# Patient Record
Sex: Male | Born: 1945 | Race: Black or African American | Hispanic: No | Marital: Married | Smoking: Current some day smoker
Health system: Southern US, Community
[De-identification: ages and names within clinical notes are randomized; demographics above are authoritative.]

## PROBLEM LIST (undated history)

## (undated) DIAGNOSIS — M5432 Sciatica, left side: Secondary | ICD-10-CM

## (undated) DIAGNOSIS — I6522 Occlusion and stenosis of left carotid artery: Secondary | ICD-10-CM

## (undated) DIAGNOSIS — R7303 Prediabetes: Secondary | ICD-10-CM

## (undated) DIAGNOSIS — M5126 Other intervertebral disc displacement, lumbar region: Secondary | ICD-10-CM

## (undated) DIAGNOSIS — M48061 Spinal stenosis, lumbar region without neurogenic claudication: Secondary | ICD-10-CM

## (undated) DIAGNOSIS — N529 Male erectile dysfunction, unspecified: Secondary | ICD-10-CM

## (undated) DIAGNOSIS — M5136 Other intervertebral disc degeneration, lumbar region: Secondary | ICD-10-CM

## (undated) DIAGNOSIS — E079 Disorder of thyroid, unspecified: Secondary | ICD-10-CM

## (undated) DIAGNOSIS — M109 Gout, unspecified: Secondary | ICD-10-CM

## (undated) DIAGNOSIS — C61 Malignant neoplasm of prostate: Secondary | ICD-10-CM

## (undated) DIAGNOSIS — Z973 Presence of spectacles and contact lenses: Secondary | ICD-10-CM

## (undated) DIAGNOSIS — Z860101 Personal history of adenomatous and serrated colon polyps: Secondary | ICD-10-CM

## (undated) DIAGNOSIS — I10 Essential (primary) hypertension: Secondary | ICD-10-CM

## (undated) DIAGNOSIS — E785 Hyperlipidemia, unspecified: Secondary | ICD-10-CM

## (undated) DIAGNOSIS — M51369 Other intervertebral disc degeneration, lumbar region without mention of lumbar back pain or lower extremity pain: Secondary | ICD-10-CM

## (undated) DIAGNOSIS — R351 Nocturia: Secondary | ICD-10-CM

## (undated) DIAGNOSIS — Z86010 Personal history of colonic polyps: Secondary | ICD-10-CM

## (undated) DIAGNOSIS — E118 Type 2 diabetes mellitus with unspecified complications: Secondary | ICD-10-CM

## (undated) HISTORY — DX: Disorder of thyroid, unspecified: E07.9

## (undated) HISTORY — DX: Malignant neoplasm of prostate: C61

## (undated) HISTORY — PX: PROSTATE BIOPSY: SHX241

## (undated) HISTORY — DX: Type 2 diabetes mellitus with unspecified complications: E11.8

## (undated) HISTORY — DX: Gout, unspecified: M10.9

## (undated) HISTORY — DX: Occlusion and stenosis of left carotid artery: I65.22

## (undated) HISTORY — PX: NO PAST SURGERIES: SHX2092

---

## 2003-05-22 ENCOUNTER — Ambulatory Visit (HOSPITAL_COMMUNITY): Admission: RE | Admit: 2003-05-22 | Discharge: 2003-05-22 | Payer: Self-pay | Admitting: Gastroenterology

## 2005-08-31 ENCOUNTER — Encounter: Admission: RE | Admit: 2005-08-31 | Discharge: 2005-08-31 | Payer: Self-pay | Admitting: Internal Medicine

## 2007-09-26 LAB — HM COLONOSCOPY

## 2011-10-27 DIAGNOSIS — Z0000 Encounter for general adult medical examination without abnormal findings: Secondary | ICD-10-CM | POA: Diagnosis not present

## 2012-03-31 ENCOUNTER — Emergency Department (INDEPENDENT_AMBULATORY_CARE_PROVIDER_SITE_OTHER)
Admission: EM | Admit: 2012-03-31 | Discharge: 2012-03-31 | Disposition: A | Discharge status: Home / Self Care | Payer: Medicare Other | Source: Home / Self Care | Attending: Emergency Medicine | Admitting: Emergency Medicine

## 2012-03-31 ENCOUNTER — Emergency Department (INDEPENDENT_AMBULATORY_CARE_PROVIDER_SITE_OTHER): Payer: Medicare Other

## 2012-03-31 ENCOUNTER — Encounter (HOSPITAL_COMMUNITY): Payer: Self-pay | Admitting: *Deleted

## 2012-03-31 DIAGNOSIS — S8990XA Unspecified injury of unspecified lower leg, initial encounter: Secondary | ICD-10-CM

## 2012-03-31 DIAGNOSIS — S99929A Unspecified injury of unspecified foot, initial encounter: Secondary | ICD-10-CM | POA: Diagnosis not present

## 2012-03-31 DIAGNOSIS — S99921A Unspecified injury of right foot, initial encounter: Secondary | ICD-10-CM

## 2012-03-31 DIAGNOSIS — S99919A Unspecified injury of unspecified ankle, initial encounter: Secondary | ICD-10-CM

## 2012-03-31 HISTORY — DX: Essential (primary) hypertension: I10

## 2012-03-31 LAB — URIC ACID: Uric Acid, Serum: 9.4 mg/dL — ABNORMAL HIGH (ref 4.0–7.8)

## 2012-03-31 MED ORDER — DICLOFENAC SODIUM 1 % TD GEL: 1.0000 "application " | Freq: Four times a day (QID) | TRANSDERMAL | Status: DC

## 2012-03-31 MED ORDER — HYDROCODONE-ACETAMINOPHEN 5-325 MG PO TABS: 2.0000 | ORAL_TABLET | ORAL | Status: AC | PRN

## 2012-03-31 NOTE — ED Notes (Signed)
Pt with c/o left foot pain onset yesterday after doing jumping jacks - pt left great toe joint red and swollen - c/o pain joint and across anterior foot

## 2012-03-31 NOTE — ED Provider Notes (Signed)
History     CSN: [id number]  Arrival date & time 03/31/12  4098   First MD Initiated Contact with Patient 03/31/12 1910      Chief Complaint  Patient presents with  . Foot Pain  . Foot Injury    (Consider location/radiation/quality/duration/timing/severity/associated sxs/prior treatment) HPI Comments: Patient states that was doing jumping jacks in his bare feet yesterday, and noticed pain, erythema, swelling at his right first MTP this morning. Reports pain with walking, limitation of motion secondary to pain. No nausea, vomiting, fevers. No hyperesthesias preceding the swelling. He has no history of gout, coagulopathies. He is not on any blood thinners or antiplatelet agents. He is not a diabetic.  ROS as noted in HPI. All other ROS negative.   Patient is a 66 y.o. male presenting with foot injury. The history is provided by the patient. No language interpreter was used.  Foot Injury  The incident occurred yesterday. The incident occurred at home. The injury mechanism was a direct blow. The pain is present in the right foot. The pain is moderate. The pain has been constant since onset. Associated symptoms include inability to bear weight and loss of motion. Pertinent negatives include no numbness, no muscle weakness, no loss of sensation and no tingling. He reports no foreign bodies present. The symptoms are aggravated by activity, bearing weight and palpation. He has tried nothing for the symptoms. The treatment provided no relief.    Past Medical History  Diagnosis Date  . Hypertension   . High cholesterol     History reviewed. No pertinent past surgical history.  History reviewed. No pertinent family history.  History  Substance Use Topics  . Smoking status: Current Some Day Smoker    Types: Cigars  . Smokeless tobacco: Not on file  . Alcohol Use: Yes      Review of Systems  Neurological: Negative for tingling and numbness.    Allergies  Review of patient's  allergies indicates no known allergies.  Home Medications   Current Outpatient Rx  Name Route Sig Dispense Refill  . ATORVASTATIN CALCIUM 10 MG PO TABS Oral Take by mouth daily.    Marland Kitchen HYDROCHLOROTHIAZIDE PO Oral Take by mouth.    Marland Kitchen HYZAAR PO Oral Take by mouth.    . DICLOFENAC SODIUM 1 % TD GEL Topical Apply 1 application topically 4 (four) times daily. 100 g 0  . HYDROCODONE-ACETAMINOPHEN 5-325 MG PO TABS Oral Take 2 tablets by mouth every 4 (four) hours as needed for pain. 20 tablet 0    BP 152/85  Pulse 70  Temp 99 F (37.2 C) (Oral)  Resp 18  SpO2 95%  Physical Exam  Nursing note and vitals reviewed. Constitutional: He is oriented to person, place, and time. He appears well-developed and well-nourished.  HENT:  Head: Normocephalic and atraumatic.  Eyes: Conjunctivae and EOM are normal.  Neck: Normal range of motion.  Cardiovascular: Normal rate.   Pulmonary/Chest: Effort normal. No respiratory distress.  Abdominal: He exhibits no distension.  Musculoskeletal: Normal range of motion.       Erythema, tenderness, swelling, increased temperature at right first MTP. Skin intact. Pain with passive flexion extension of toe., And pain on the medial aspect of the joint. Right midfoot NT. Base of fifth metatarsal NT. No bruising.  DP 2+. Sensation grossly intact. Patient able to move all toes actively.   no pain with inversion / eversion,  dorsiflexion / plantarflexion. Ankle exam within normal limits. Patient able to bear weight while  in department.   Neurological: He is alert and oriented to person, place, and time. Coordination normal.  Skin: Skin is warm and dry.  Psychiatric: He has a normal mood and affect. His behavior is normal. Judgment and thought content normal.    ED Course  Procedures (including critical care time)   Labs Reviewed  URIC ACID   Dg Foot Complete Right  03/31/2012  *RADIOLOGY REPORT*  Clinical Data: Injury  RIGHT FOOT COMPLETE - 3+ VIEW  Comparison:  None.  Findings: Mild degenerative change of the first metatarsal- phalangeal joint.  Minimal spurring at the inferior calcaneus.  No acute fracture.  No dislocation.  IMPRESSION: Mild degenerative change.  No acute bony pathology.  Original Report Authenticated By: Donavan Burnet, M.D.     1. Right foot injury       MDM  X-ray reviewed by myself. Discussed with radiologist. No fractures.   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 but we'll check a uric acid, as he is male and on diuretics which would increase his susceptibility to gout. Sending him with topical Voltaren, as he is elderly and on ACEi, 2 diuretics. Advised ice, elevation, Norco as needed. Home with stiff soled shoe and Ace wrap. Will contact him if his uric acid is elevated. He'll followup with his para care physician as needed.  Luiz Blare, MD 03/31/12 2028

## 2012-04-01 ENCOUNTER — Telehealth (HOSPITAL_COMMUNITY): Payer: Self-pay | Admitting: *Deleted

## 2012-04-01 NOTE — ED Notes (Signed)
Uric Acid 9.4 H.  Lab shown to Dr. Chaney Malling. Order obtained for Colchicine 1.2 gm. PO x 1 and 0.6 mg. PO 1 hr later (0.6 mg # 3).  Tell pt. it is suggestive of gout but would need a tap for a true diagnosis.  Tell pt. to f/u with his PCP for further diagnosis and treatment.  I called pt. and left message for pt. to call. Travis Singh 04/01/2012

## 2012-04-02 ENCOUNTER — Telehealth (HOSPITAL_COMMUNITY): Payer: Self-pay | Admitting: *Deleted

## 2012-04-03 ENCOUNTER — Telehealth (HOSPITAL_COMMUNITY): Payer: Self-pay | Admitting: *Deleted

## 2012-04-04 ENCOUNTER — Telehealth (HOSPITAL_COMMUNITY): Payer: Self-pay | Admitting: *Deleted

## 2012-04-06 ENCOUNTER — Telehealth (HOSPITAL_COMMUNITY): Payer: Self-pay | Admitting: Emergency Medicine

## 2012-04-06 NOTE — ED Notes (Signed)
Pt came in today bringing letter sent by Cherly Anderson RN.  Patient states his pharmacy is [facility].  RX called in to [facility] [address] by Sheffield Slider RN

## 2012-04-09 DIAGNOSIS — M109 Gout, unspecified: Secondary | ICD-10-CM | POA: Diagnosis not present

## 2012-06-10 DIAGNOSIS — E785 Hyperlipidemia, unspecified: Secondary | ICD-10-CM | POA: Diagnosis not present

## 2012-06-10 DIAGNOSIS — M109 Gout, unspecified: Secondary | ICD-10-CM | POA: Diagnosis not present

## 2012-06-10 DIAGNOSIS — N400 Enlarged prostate without lower urinary tract symptoms: Secondary | ICD-10-CM | POA: Diagnosis not present

## 2012-11-14 ENCOUNTER — Encounter: Payer: Self-pay | Admitting: *Deleted

## 2012-11-14 DIAGNOSIS — I10 Essential (primary) hypertension: Secondary | ICD-10-CM | POA: Insufficient documentation

## 2012-11-22 DIAGNOSIS — I10 Essential (primary) hypertension: Secondary | ICD-10-CM | POA: Diagnosis not present

## 2012-11-22 DIAGNOSIS — E785 Hyperlipidemia, unspecified: Secondary | ICD-10-CM | POA: Diagnosis not present

## 2012-11-25 DIAGNOSIS — H25099 Other age-related incipient cataract, unspecified eye: Secondary | ICD-10-CM | POA: Diagnosis not present

## 2012-11-28 DIAGNOSIS — I10 Essential (primary) hypertension: Secondary | ICD-10-CM | POA: Diagnosis not present

## 2012-11-28 DIAGNOSIS — E785 Hyperlipidemia, unspecified: Secondary | ICD-10-CM | POA: Diagnosis not present

## 2013-01-24 ENCOUNTER — Telehealth: Payer: Self-pay | Admitting: Family Medicine

## 2013-01-24 MED ORDER — COLCHICINE 0.6 MG PO TABS: 0.6000 mg | ORAL_TABLET | Freq: Every day | ORAL | Status: DC

## 2013-01-24 NOTE — Telephone Encounter (Signed)
Rx Refilled  

## 2013-02-19 ENCOUNTER — Other Ambulatory Visit: Payer: Self-pay | Admitting: Family Medicine

## 2013-02-19 MED ORDER — LISINOPRIL 20 MG PO TABS: 20.0000 mg | ORAL_TABLET | Freq: Every day | ORAL | Status: DC

## 2013-02-19 NOTE — Telephone Encounter (Signed)
Rx Refilled  

## 2013-09-23 ENCOUNTER — Encounter: Payer: Self-pay | Admitting: Family Medicine

## 2013-09-23 ENCOUNTER — Ambulatory Visit (INDEPENDENT_AMBULATORY_CARE_PROVIDER_SITE_OTHER): Payer: Medicare Other | Admitting: Family Medicine

## 2013-09-23 VITALS — BP 140/80 | HR 78 | Temp 98.8°F | Resp 16 | Ht 68.5 in | Wt 208.0 lb

## 2013-09-23 DIAGNOSIS — E785 Hyperlipidemia, unspecified: Secondary | ICD-10-CM | POA: Diagnosis not present

## 2013-09-23 DIAGNOSIS — I10 Essential (primary) hypertension: Secondary | ICD-10-CM

## 2013-09-23 DIAGNOSIS — M5430 Sciatica, unspecified side: Secondary | ICD-10-CM | POA: Insufficient documentation

## 2013-09-23 DIAGNOSIS — M5431 Sciatica, right side: Secondary | ICD-10-CM

## 2013-09-23 LAB — LIPID PANEL
Cholesterol: 179 mg/dL (ref 0–200)
HDL: 43 mg/dL (ref >39)
LDL Cholesterol: 105 mg/dL — ABNORMAL HIGH (ref 0–99)
Total CHOL/HDL Ratio: 4.2 Ratio
Triglycerides: 153 mg/dL — ABNORMAL HIGH (ref <150)
VLDL: 31 mg/dL (ref 0–40)

## 2013-09-23 LAB — CBC WITH DIFFERENTIAL/PLATELET
Basophils Absolute: 0 10*3/uL (ref 0.0–0.1)
Basophils Relative: 0 % (ref 0–1)
Eosinophils Absolute: 0.1 10*3/uL (ref 0.0–0.7)
Eosinophils Relative: 2 % (ref 0–5)
HCT: 44.1 % (ref 39.0–52.0)
Hemoglobin: 14.9 g/dL (ref 13.0–17.0)
Lymphocytes Relative: 29 % (ref 12–46)
Lymphs Abs: 1.4 10*3/uL (ref 0.7–4.0)
MCH: 28.2 pg (ref 26.0–34.0)
MCHC: 33.8 g/dL (ref 30.0–36.0)
MCV: 83.5 fL (ref 78.0–100.0)
Monocytes Absolute: 0.6 10*3/uL (ref 0.1–1.0)
Monocytes Relative: 12 % (ref 3–12)
Neutro Abs: 2.8 10*3/uL (ref 1.7–7.7)
Neutrophils Relative %: 57 % (ref 43–77)
Platelets: 187 10*3/uL (ref 150–400)
RBC: 5.28 MIL/uL (ref 4.22–5.81)
RDW: 14.3 % (ref 11.5–15.5)
WBC: 4.8 10*3/uL (ref 4.0–10.5)

## 2013-09-23 LAB — COMPREHENSIVE METABOLIC PANEL
ALT: 26 U/L (ref 0–53)
AST: 20 U/L (ref 0–37)
Albumin: 4 g/dL (ref 3.5–5.2)
Alkaline Phosphatase: 73 U/L (ref 39–117)
BUN: 13 mg/dL (ref 6–23)
CO2: 21 mEq/L (ref 19–32)
Calcium: 9.1 mg/dL (ref 8.4–10.5)
Chloride: 108 mEq/L (ref 96–112)
Creat: 0.96 mg/dL (ref 0.50–1.35)
Glucose, Bld: 108 mg/dL — ABNORMAL HIGH (ref 70–99)
Potassium: 4.5 mEq/L (ref 3.5–5.3)
Sodium: 140 mEq/L (ref 135–145)
Total Bilirubin: 0.8 mg/dL (ref 0.3–1.2)
Total Protein: 6.8 g/dL (ref 6.0–8.3)

## 2013-09-23 MED ORDER — COLCHICINE 0.6 MG PO TABS: 0.6000 mg | ORAL_TABLET | Freq: Every day | ORAL | Status: DC

## 2013-09-23 MED ORDER — METHYLPREDNISOLONE 4 MG PO KIT: PACK | ORAL | Status: DC

## 2013-09-23 NOTE — Assessment & Plan Note (Signed)
His blood pressure looks okay based on his age. He is currently on lisinopril and verapamil

## 2013-09-23 NOTE — Assessment & Plan Note (Signed)
He is on Lipitor. I will check his lipid panel as well as his liver function tests

## 2013-09-23 NOTE — Progress Notes (Signed)
   Subjective:    Patient ID: Travis Singh, male    DOB: 09-08-1946, 67 y.o.   MRN: [id number]  HPI  Patient here with right-sided back and leg pain for the past week. He states that when he sits or lays he does not have any pain but when he gets up to walk he gets a shooting sensation on the right side of his back down his leg. He admits to some tingling after he has a sharp pain. He denies any change in bowel or bladder. He denies any weakness in the legs he denies any falls. He is very active and continues to work. He does take Tylenol every now and then.  He also has a history of hypertension his medications were reviewed he also has a history of gout. He is overdue for fasting labs.  Review of Systems  GEN- denies fatigue, fever, weight loss,weakness, recent illness HEENT- denies eye drainage, change in vision, nasal discharge, CVS- denies chest pain, palpitations RESP- denies SOB, cough, wheeze ABD- denies N/V, change in stools, abd pain GU- denies dysuria, hematuria, dribbling, incontinence MSK- + joint pain, muscle aches, injury Neuro- denies headache, dizziness, syncope, seizure activity      Objective:   Physical Exam GEN- NAD, alert and oriented x3 HEENT- PERRL, EOMI, non injected sclera, pink conjunctiva, MMM, oropharynx clear Neck- Supple, no thryomegaly CVS- RRR, soft systolic murmur RESP-CTAB ABD-NABS,soft,NT,ND, no CVA tenderness MSK- Spine NT, neg SLR bilat, Good ROM Bilat Hips Neuro- normal tone bilat, strength equal bilat, DTR 1+ bilat, sensation in tact EXT- No edema Pulses- Radial 2+        Assessment & Plan:   Note he did pass when his colonoscopy was due as he received a letter in the mail from his insurance company, I do not have a copy of the colonoscopy that I can see in his chart.

## 2013-09-23 NOTE — Assessment & Plan Note (Signed)
I wouldn't treat based on duration exam for sciatica. At that time on a Medrol Dosepak. I advised not to take anti-inflammatories with the prednisone at this time. He can take acetaminophen as needed. If he does not improve I will obtain a plain film of the back and proceed from there.

## 2013-09-23 NOTE — Patient Instructions (Addendum)
We will send letter with results if normal Start prednisone pak for your back Okay to take Tylenol Do not take any Ibuprofen or Aleve with the prednisone Call if the back does not improve COntinue all other medications Release of records- Dr. Charna Elizabeth- Last Colonoscopy F/U 3 months   Sciatica Sciatica is pain, weakness, numbness, or tingling along your sciatic nerve. The nerve starts in the lower back and runs down the back of each leg. Nerve damage or certain conditions pinch or put pressure on the sciatic nerve. This causes the pain, weakness, and other discomforts of sciatica. HOME CARE   Only take medicine as told by your doctor.  Apply ice to the affected area for 20 minutes. Do this 3 4 times a day for the first 48 72 hours. Then try heat in the same way.  Exercise, stretch, or do your usual activities if these do not make your pain worse.  Go to physical therapy as told by your doctor.  Keep all doctor visits as told.  Do not wear high heels or shoes that are not supportive.  Get a firm mattress if your mattress is too soft to lessen pain and discomfort. GET HELP RIGHT AWAY IF:   You cannot control when you poop (bowel movement) or pee (urinate).  You have more weakness in your lower back, lower belly (pelvis), butt (buttocks), or legs.  You have redness or puffiness (swelling) of your back.  You have a burning feeling when you pee.  You have pain that gets worse when you lie down.  You have pain that wakes you from your sleep.  Your pain is worse than past pain.  Your pain lasts longer than 4 weeks.  You are suddenly losing weight without reason. MAKE SURE YOU:   Understand these instructions.  Will watch this condition.  Will get help right away if you are not doing well or get worse. Document Released: 06/20/2008 Document Revised: 03/12/2012 Document Reviewed: 01/21/2012 [facility] Patient Information 2014 [place], [place].

## 2013-09-24 ENCOUNTER — Encounter: Payer: Self-pay | Admitting: *Deleted

## 2013-10-28 ENCOUNTER — Encounter: Payer: Self-pay | Admitting: Family Medicine

## 2013-10-28 ENCOUNTER — Ambulatory Visit (INDEPENDENT_AMBULATORY_CARE_PROVIDER_SITE_OTHER): Payer: Medicare Other | Admitting: Family Medicine

## 2013-10-28 VITALS — BP 146/82 | HR 62 | Temp 97.3°F | Resp 14 | Ht 68.5 in | Wt 210.0 lb

## 2013-10-28 DIAGNOSIS — M5430 Sciatica, unspecified side: Secondary | ICD-10-CM

## 2013-10-28 DIAGNOSIS — M5431 Sciatica, right side: Secondary | ICD-10-CM

## 2013-10-28 NOTE — Progress Notes (Signed)
   Subjective:    Patient ID: Travis Singh, male    DOB: 1946/04/19, 68 y.o.   MRN: [id number]  HPI  Patient reports numbness that radiates from his low back down his right leg into his right calf for the last 4 months. It is steadily getting worse. When the patient is supine he has no problems.  However when he stands for a prolonged period of time graft for a prolonged period of time he begins to experience numbness and tingling and pins and needle sensation which radiates to his back down his leg. He denies any fevers chills. He denies any low back pain. He denies any bladder incontinence or symptoms of cauda equina syndrome. He denies any hematuria or dysuria. He denies any hip pain. Past Medical History  Diagnosis Date  . High cholesterol   . Hypertension   . Gout    Current Outpatient Prescriptions on File Prior to Visit  Medication Sig Dispense Refill  . atorvastatin (LIPITOR) 10 MG tablet Take by mouth daily.      . colchicine 0.6 MG tablet Take 1 tablet (0.6 mg total) by mouth daily.  15 tablet  3  . lisinopril (PRINIVIL,ZESTRIL) 20 MG tablet Take 1 tablet (20 mg total) by mouth daily.  30 tablet  5  . verapamil (VERELAN PM) 240 MG 24 hr capsule Take 240 mg by mouth at bedtime.       No current facility-administered medications on file prior to visit.   No Known Allergies History   Social History  . Marital Status: Married    Spouse Name: N/A    Number of Children: N/A  . Years of Education: N/A   Occupational History  . Not on file.   Social History Main Topics  . Smoking status: Current Some Day Smoker    Types: Cigars  . Smokeless tobacco: Not on file  . Alcohol Use: 4.2 oz/week    7 Cans of beer per week  . Drug Use: No  . Sexual Activity: Not on file   Other Topics Concern  . Not on file   Social History Narrative  . No narrative on file     Review of Systems  All other systems reviewed and are negative.       Objective:   Physical Exam    Vitals reviewed. Constitutional: He is oriented to person, place, and time.  Cardiovascular: Normal rate, regular rhythm and normal heart sounds.   Pulmonary/Chest: Effort normal and breath sounds normal.  Musculoskeletal: Normal range of motion.       Lumbar back: He exhibits normal range of motion, no tenderness, no bony tenderness, no pain and no spasm.  Neurological: He is alert and oriented to person, place, and time. He has normal reflexes. He displays normal reflexes. No cranial nerve deficit. He exhibits normal muscle tone. Coordination normal.   patient has a negative straight leg raise.        Assessment & Plan:  1. Right sided sciatica I believe the patient experiencing right-sided sciatica. I will obtain an MRI of the lumbar spine. Further treatment decisions will be based on the results of the lumbar spine MRI. The patient's symptoms have extended now for 3 months and are not improving with conservative therapy - MR Lumbar Spine Wo Contrast; Future

## 2013-11-03 ENCOUNTER — Ambulatory Visit
Admission: RE | Admit: 2013-11-03 | Discharge: 2013-11-03 | Disposition: A | Discharge status: Home / Self Care | Payer: Medicare Other | Source: Ambulatory Visit | Attending: Family Medicine | Admitting: Family Medicine

## 2013-11-03 DIAGNOSIS — M5431 Sciatica, right side: Secondary | ICD-10-CM

## 2013-11-03 DIAGNOSIS — M47817 Spondylosis without myelopathy or radiculopathy, lumbosacral region: Secondary | ICD-10-CM | POA: Diagnosis not present

## 2013-11-03 DIAGNOSIS — M48061 Spinal stenosis, lumbar region without neurogenic claudication: Secondary | ICD-10-CM | POA: Diagnosis not present

## 2013-11-03 DIAGNOSIS — M4310 Spondylolisthesis, site unspecified: Secondary | ICD-10-CM | POA: Diagnosis not present

## 2013-11-07 ENCOUNTER — Telehealth: Payer: Self-pay | Admitting: Family Medicine

## 2013-11-07 MED ORDER — CYCLOBENZAPRINE HCL 10 MG PO TABS: 10.0000 mg | ORAL_TABLET | Freq: Three times a day (TID) | ORAL | Status: DC | PRN

## 2013-11-07 NOTE — Telephone Encounter (Signed)
Pt called and made aware of RX.  Can take TID as needed.  Rx sent to pharmacy

## 2013-11-07 NOTE — Telephone Encounter (Signed)
Discussed Lumbar MRI results.  Pt is asking for a mild muscle relaxer to help when is uncomfortable.  Feels that will be sufficient for now.

## 2013-11-07 NOTE — Telephone Encounter (Signed)
Flexeril 10 mg tid prn muscle spasm. [place]

## 2013-12-01 ENCOUNTER — Other Ambulatory Visit: Payer: Self-pay | Admitting: Family Medicine

## 2013-12-22 ENCOUNTER — Ambulatory Visit: Payer: Medicare Other | Admitting: Family Medicine

## 2013-12-25 ENCOUNTER — Encounter: Payer: Self-pay | Admitting: Family Medicine

## 2013-12-25 ENCOUNTER — Ambulatory Visit (INDEPENDENT_AMBULATORY_CARE_PROVIDER_SITE_OTHER): Payer: Medicare Other | Admitting: Family Medicine

## 2013-12-25 VITALS — BP 138/76 | HR 78 | Temp 97.6°F | Resp 14 | Ht 68.5 in | Wt 208.0 lb

## 2013-12-25 DIAGNOSIS — Z125 Encounter for screening for malignant neoplasm of prostate: Secondary | ICD-10-CM | POA: Diagnosis not present

## 2013-12-25 DIAGNOSIS — E785 Hyperlipidemia, unspecified: Secondary | ICD-10-CM

## 2013-12-25 DIAGNOSIS — I10 Essential (primary) hypertension: Secondary | ICD-10-CM | POA: Diagnosis not present

## 2013-12-25 DIAGNOSIS — M109 Gout, unspecified: Secondary | ICD-10-CM

## 2013-12-25 LAB — LIPID PANEL
Cholesterol: 236 mg/dL — ABNORMAL HIGH (ref 0–200)
HDL: 40 mg/dL (ref >39)
LDL Cholesterol: 163 mg/dL — ABNORMAL HIGH (ref 0–99)
Total CHOL/HDL Ratio: 5.9 Ratio
Triglycerides: 163 mg/dL — ABNORMAL HIGH (ref <150)
VLDL: 33 mg/dL (ref 0–40)

## 2013-12-25 LAB — COMPLETE METABOLIC PANEL WITH GFR
ALT: 27 U/L (ref 0–53)
AST: 24 U/L (ref 0–37)
Albumin: 3.8 g/dL (ref 3.5–5.2)
Alkaline Phosphatase: 67 U/L (ref 39–117)
BUN: 19 mg/dL (ref 6–23)
CO2: 25 mEq/L (ref 19–32)
Calcium: 8.8 mg/dL (ref 8.4–10.5)
Chloride: 107 mEq/L (ref 96–112)
Creat: 1.17 mg/dL (ref 0.50–1.35)
GFR, Est African American: 73 mL/min
GFR, Est Non African American: 63 mL/min
Glucose, Bld: 102 mg/dL — ABNORMAL HIGH (ref 70–99)
Potassium: 4.8 mEq/L (ref 3.5–5.3)
Sodium: 141 mEq/L (ref 135–145)
Total Bilirubin: 0.6 mg/dL (ref 0.2–1.2)
Total Protein: 6.3 g/dL (ref 6.0–8.3)

## 2013-12-25 LAB — CBC WITH DIFFERENTIAL/PLATELET
Basophils Absolute: 0 10*3/uL (ref 0.0–0.1)
Basophils Relative: 0 % (ref 0–1)
Eosinophils Absolute: 0 10*3/uL (ref 0.0–0.7)
Eosinophils Relative: 1 % (ref 0–5)
HCT: 42.8 % (ref 39.0–52.0)
Hemoglobin: 14.3 g/dL (ref 13.0–17.0)
Lymphocytes Relative: 32 % (ref 12–46)
Lymphs Abs: 1.4 10*3/uL (ref 0.7–4.0)
MCH: 27.9 pg (ref 26.0–34.0)
MCHC: 33.4 g/dL (ref 30.0–36.0)
MCV: 83.4 fL (ref 78.0–100.0)
Monocytes Absolute: 0.5 10*3/uL (ref 0.1–1.0)
Monocytes Relative: 11 % (ref 3–12)
Neutro Abs: 2.4 10*3/uL (ref 1.7–7.7)
Neutrophils Relative %: 56 % (ref 43–77)
Platelets: 210 10*3/uL (ref 150–400)
RBC: 5.13 MIL/uL (ref 4.22–5.81)
RDW: 14.4 % (ref 11.5–15.5)
WBC: 4.3 10*3/uL (ref 4.0–10.5)

## 2013-12-25 LAB — URIC ACID: Uric Acid, Serum: 7.8 mg/dL (ref 4.0–7.8)

## 2013-12-25 NOTE — Progress Notes (Signed)
Subjective:    Patient ID: Travis Singh, male    DOB: [birth year], 68 y.o.   MRN: [id number]  HPI Patient is here today for followup of his hypertension, hyperlipidemia, and gout. Recently had right-sided sciatica. 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 He is asymptomatic at present time and is doing well. He denies any chest pain shortness of breath or dyspnea on exertion. He has a mild gout attack every 3 months but does not want to take any medication to prevent the attack.  He denies any myalgias or right quadrant pain on Lipitor. Past Medical History  Diagnosis Date  . High cholesterol   . Hypertension   . Gout    Current Outpatient Prescriptions on File Prior to Visit  Medication Sig Dispense Refill  . atorvastatin (LIPITOR) 10 MG tablet Take by mouth daily.      . colchicine 0.6 MG tablet Take 1 tablet (0.6 mg total) by mouth daily.  15 tablet  3  . cyclobenzaprine (FLEXERIL) 10 MG tablet Take 1 tablet (10 mg total) by mouth 3 (three) times daily as needed for muscle spasms.  90 tablet  0  . lisinopril (PRINIVIL,ZESTRIL) 20 MG tablet TAKE ONE TABLET BY MOUTH ONCE DAILY  30 tablet  3  . verapamil (CALAN-SR) 240 MG CR tablet TAKE ONE  BY MOUTH ONCE DAILY  30 tablet  3   No current facility-administered medications on file prior to visit.   No Known Allergies History   Social History  . Marital Status: Married    Spouse Name: N/A    Number of Children: N/A  . Years of Education: N/A   Occupational History  . Not on file.   Social History Main Topics  . Smoking status: Current Some Day Smoker    Types: Cigars  . Smokeless tobacco: Not on file  . Alcohol Use: 4.2 oz/week    7 Cans of beer per week  . Drug Use: No  . Sexual Activity: Not on file   Other Topics Concern  . Not on file   Social History Narrative  . No narrative on file     Review of Systems  All other systems reviewed and are  negative.       Objective:   Physical Exam  Vitals reviewed. Constitutional: He appears well-developed and well-nourished.  Neck: Neck supple. No thyromegaly present.  Cardiovascular: Normal rate, regular rhythm and normal heart sounds.  Exam reveals no gallop and no friction rub.   No murmur heard. Pulmonary/Chest: Effort normal and breath sounds normal. No respiratory distress. He has no wheezes. He has no rales. He exhibits no tenderness.  Abdominal: Soft. Bowel sounds are normal. He exhibits no distension. There is no tenderness. There is no rebound and no guarding.  Musculoskeletal: He exhibits no edema.  Lymphadenopathy:    He has no cervical adenopathy.          Assessment & Plan:  1. Hypertension Blood pressures controlled. Continue current medications at their present dosages - COMPLETE METABOLIC PANEL WITH GFR - CBC with Differential  2. Other and unspecified hyperlipidemia Check fasting lipid panel. Goal LDL is less than 130. - COMPLETE METABOLIC PANEL WITH GFR - Lipid panel  3. Gout Check uric acid level. Goal uric acid level is less than 6. I discussed starting the patient on allopurinol the brain uric acid level below 6 to prevent gout flares but he defers that at  present. - Uric acid  4. Prostate cancer screening Check PSA. - PSA, Medicare

## 2013-12-26 LAB — PSA, MEDICARE: PSA: 3.94 ng/mL (ref <=4.00)

## 2013-12-30 ENCOUNTER — Other Ambulatory Visit: Payer: Self-pay | Admitting: *Deleted

## 2013-12-30 MED ORDER — ATORVASTATIN CALCIUM 10 MG PO TABS
10.0000 mg | ORAL_TABLET | Freq: Every day | ORAL | Status: DC
Start: 2013-12-30 — End: 2015-02-15

## 2013-12-30 NOTE — Telephone Encounter (Signed)
Prescription refilled per patient request

## 2014-02-20 ENCOUNTER — Encounter: Payer: Self-pay | Admitting: Family Medicine

## 2014-02-20 ENCOUNTER — Ambulatory Visit (INDEPENDENT_AMBULATORY_CARE_PROVIDER_SITE_OTHER): Payer: Medicare Other | Admitting: Family Medicine

## 2014-02-20 VITALS — BP 110/70 | HR 76 | Temp 97.7°F | Resp 14 | Ht 68.5 in | Wt 209.0 lb

## 2014-02-20 DIAGNOSIS — R3589 Other polyuria: Secondary | ICD-10-CM

## 2014-02-20 DIAGNOSIS — R358 Other polyuria: Secondary | ICD-10-CM | POA: Diagnosis not present

## 2014-02-20 DIAGNOSIS — M5431 Sciatica, right side: Secondary | ICD-10-CM | POA: Insufficient documentation

## 2014-02-20 NOTE — Progress Notes (Signed)
Subjective:    Patient ID: Travis Singh, male    DOB: [birth year], 68 y.o.   MRN: [id number]  HPI Patient presents with episodes of urinary incontinence. He states he feels a sudden urge to void.  He is unable to make it to the restroom in time. Patient states that he is peeing a normal quantity. He denies any weak stream. He denies any dysuria or hematuria. He denies any hesitancy. He does have an elevated PSA at 3.94. He denies any rectal pain or dysuria. On examination today he has mild enlargement prostate. There no nodules and no tenderness.  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 He denies any symptoms of cauda equina syndrome. His most recent blood sugar was normal. Patient states that he has to go to the restroom 6-8 times a day. He gets sudden urges and he has less than 60 seconds to make it to the restroom. Past Medical History  Diagnosis Date  . High cholesterol   . Hypertension   . Gout   . 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 atorvastatin (LIPITOR) 10 MG tablet Take 1 tablet (10 mg total) by mouth daily.  90 tablet  3  . colchicine 0.6 MG tablet Take 1 tablet (0.6 mg total) by mouth daily.  15 tablet  3  . cyclobenzaprine (FLEXERIL) 10 MG tablet Take 1 tablet (10 mg total) by mouth 3 (three) times daily as needed for muscle spasms.  90 tablet  0  . lisinopril (PRINIVIL,ZESTRIL) 20 MG tablet TAKE ONE TABLET BY MOUTH ONCE DAILY  30 tablet  3  . verapamil (CALAN-SR) 240 MG CR tablet TAKE ONE  BY MOUTH ONCE DAILY  30 tablet  3   No current facility-administered medications on file prior to visit.   No Known Allergies History   Social History  . Marital Status: Married    Spouse Name: N/A    Number of Children: N/A  . Years of Education: N/A   Occupational History  . Not on file.   Social History Main Topics   . Smoking status: Current Some Day Smoker    Types: Cigars  . Smokeless tobacco: Not on file  . Alcohol Use: 4.2 oz/week    7 Cans of beer per week  . Drug Use: No  . Sexual Activity: Not on file   Other Topics Concern  . Not on file   Social History Narrative  . No narrative on file      Review of Systems  All other systems reviewed and are negative.      Objective:   Physical Exam  Vitals reviewed. Constitutional: He is oriented to person, place, and time.  Cardiovascular: Normal rate, regular rhythm and normal heart sounds.   No murmur heard. Pulmonary/Chest: Effort normal and breath sounds normal.  Abdominal: Soft. Bowel sounds are normal. He exhibits no distension and no mass. There is no tenderness. There is no rebound and no guarding.  Genitourinary: Rectum normal, prostate normal and penis normal.  Neurological: He is alert and oriented to person, place, and time. He has normal reflexes. He displays normal reflexes. No cranial nerve deficit. He exhibits normal muscle tone. Coordination normal.          Assessment & Plan:  1. Polyuria Differential diagnoses includes BPH, prostatitis, overactive  bladder, hyperglycemia, urinary tract infection. There is no evidence of cauda equina syndrome on his exam today. I believe his symptoms are more consistent with overactive bladder. Symptoms have been going on for several months. The urinary tract infection is unlikely. I will start the patient on VESIcare 10 mg by mouth daily and recheck in 1 week.

## 2014-05-06 ENCOUNTER — Other Ambulatory Visit: Payer: Self-pay | Admitting: Family Medicine

## 2014-07-10 ENCOUNTER — Other Ambulatory Visit: Payer: Self-pay | Admitting: Family Medicine

## 2014-07-10 NOTE — Telephone Encounter (Signed)
Ok to refill??  Last office visit 02/20/2014.  Last refill 11/07/2013.

## 2014-07-10 NOTE — Telephone Encounter (Signed)
ok 

## 2014-07-10 NOTE — Telephone Encounter (Signed)
Prescription sent to pharmacy.

## 2014-07-28 ENCOUNTER — Telehealth: Payer: Self-pay | Admitting: Family Medicine

## 2014-07-28 NOTE — Telephone Encounter (Signed)
Patient would like to know if we can call in allopurinol instead of colchicine because of cost reasons  Please call him at [phone] and let him know  [facility] [address]

## 2014-07-29 NOTE — Telephone Encounter (Signed)
OK to switch? (need sig)

## 2014-07-30 NOTE — Telephone Encounter (Signed)
Pt does not take colchicine daily but her would like to try something cheaper for him but he has several pills left. He was advised to schedule an appt and he will call back to set that up.

## 2014-07-30 NOTE — Telephone Encounter (Signed)
Colchicine and allopurinol are not the same and would not do the same thing.  Colchicine is for acute pain and allopurinol is a preventative which would not do anything for acute pain.

## 2014-10-08 ENCOUNTER — Telehealth: Payer: Self-pay | Admitting: Family Medicine

## 2014-10-08 NOTE — Telephone Encounter (Signed)
Travis Singh Pt is needing a refill on colchicine 0.6 MG tablet -please call pt before refilling he is talking about a sheet for ordering online

## 2014-10-09 MED ORDER — COLCHICINE 0.6 MG PO TABS: 0.6000 mg | ORAL_TABLET | Freq: Every day | ORAL | Status: DC

## 2014-10-09 NOTE — Telephone Encounter (Signed)
Pt needs a written rx for this to send it into the pharm so he can get it a little cheaper. Rx printed and left up front.

## 2014-10-23 ENCOUNTER — Other Ambulatory Visit: Payer: Self-pay | Admitting: Family Medicine

## 2015-02-12 ENCOUNTER — Ambulatory Visit (INDEPENDENT_AMBULATORY_CARE_PROVIDER_SITE_OTHER): Payer: Medicare Other | Admitting: Family Medicine

## 2015-02-12 ENCOUNTER — Encounter: Payer: Self-pay | Admitting: Family Medicine

## 2015-02-12 VITALS — BP 102/70 | HR 60 | Temp 98.2°F | Resp 14 | Ht 68.5 in | Wt 210.0 lb

## 2015-02-12 DIAGNOSIS — Z0000 Encounter for general adult medical examination without abnormal findings: Secondary | ICD-10-CM | POA: Diagnosis not present

## 2015-02-12 DIAGNOSIS — Z23 Encounter for immunization: Secondary | ICD-10-CM | POA: Diagnosis not present

## 2015-02-12 DIAGNOSIS — Z125 Encounter for screening for malignant neoplasm of prostate: Secondary | ICD-10-CM

## 2015-02-12 DIAGNOSIS — I10 Essential (primary) hypertension: Secondary | ICD-10-CM | POA: Diagnosis not present

## 2015-02-12 LAB — COMPLETE METABOLIC PANEL WITH GFR
ALT: 21 U/L (ref 0–53)
AST: 15 U/L (ref 0–37)
Albumin: 3.8 g/dL (ref 3.5–5.2)
Alkaline Phosphatase: 67 U/L (ref 39–117)
BUN: 20 mg/dL (ref 6–23)
CO2: 24 mEq/L (ref 19–32)
Calcium: 9 mg/dL (ref 8.4–10.5)
Chloride: 108 mEq/L (ref 96–112)
Creat: 1.13 mg/dL (ref 0.50–1.35)
GFR, Est African American: 75 mL/min
GFR, Est Non African American: 65 mL/min
Glucose, Bld: 111 mg/dL — ABNORMAL HIGH (ref 70–99)
Potassium: 4.3 mEq/L (ref 3.5–5.3)
Sodium: 139 mEq/L (ref 135–145)
Total Bilirubin: 0.6 mg/dL (ref 0.2–1.2)
Total Protein: 6.8 g/dL (ref 6.0–8.3)

## 2015-02-12 LAB — LIPID PANEL
Cholesterol: 270 mg/dL — ABNORMAL HIGH (ref 0–200)
HDL: 37 mg/dL — ABNORMAL LOW (ref >=40)
LDL Cholesterol: 197 mg/dL — ABNORMAL HIGH (ref 0–99)
Total CHOL/HDL Ratio: 7.3 Ratio
Triglycerides: 179 mg/dL — ABNORMAL HIGH (ref <150)
VLDL: 36 mg/dL (ref 0–40)

## 2015-02-12 LAB — CBC WITH DIFFERENTIAL/PLATELET
Basophils Absolute: 0 10*3/uL (ref 0.0–0.1)
Basophils Relative: 0 % (ref 0–1)
Eosinophils Absolute: 0 10*3/uL (ref 0.0–0.7)
Eosinophils Relative: 1 % (ref 0–5)
HCT: 44 % (ref 39.0–52.0)
Hemoglobin: 14.8 g/dL (ref 13.0–17.0)
Lymphocytes Relative: 33 % (ref 12–46)
Lymphs Abs: 1.5 10*3/uL (ref 0.7–4.0)
MCH: 27.9 pg (ref 26.0–34.0)
MCHC: 33.6 g/dL (ref 30.0–36.0)
MCV: 82.9 fL (ref 78.0–100.0)
MPV: 10.1 fL (ref 8.6–12.4)
Monocytes Absolute: 0.5 10*3/uL (ref 0.1–1.0)
Monocytes Relative: 12 % (ref 3–12)
Neutro Abs: 2.4 10*3/uL (ref 1.7–7.7)
Neutrophils Relative %: 54 % (ref 43–77)
Platelets: 203 10*3/uL (ref 150–400)
RBC: 5.31 MIL/uL (ref 4.22–5.81)
RDW: 14.4 % (ref 11.5–15.5)
WBC: 4.5 10*3/uL (ref 4.0–10.5)

## 2015-02-12 NOTE — Progress Notes (Signed)
Subjective:    Patient ID: Travis Singh, male    DOB: Feb 10, 1946, 69 y.o.   MRN: [id number]  HPI  Patient is a very pleasant 69 year old African-American male who is here today for a physical exam. His last colonoscopy was in 2009. He is due again in 2019. He is due for prostate exam today. Patient has had Pneumovax 23. He is due for Prevnar 13. He is also due for the shingles vaccine. He declined the shingles vaccine today. He also declines a tetanus shot today. He is due for fasting lab work. Past Medical History  Diagnosis Date  . High cholesterol   . Hypertension   . Gout   . 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 Current Outpatient Prescriptions on File Prior to Visit  Medication Sig Dispense Refill  . colchicine 0.6 MG tablet Take 1 tablet (0.6 mg total) by mouth daily. 90 tablet 1  . cyclobenzaprine (FLEXERIL) 10 MG tablet TAKE ONE TABLET BY MOUTH THREE TIMES DAILY AS NEEDED FOR MUSCLE SPASM 90 tablet 0  . lisinopril (PRINIVIL,ZESTRIL) 20 MG tablet TAKE ONE TABLET BY MOUTH ONCE DAILY 30 tablet 11  . verapamil (CALAN-SR) 240 MG CR tablet TAKE ONE TABLET BY MOUTH ONCE DAILY 30 tablet 11  . atorvastatin (LIPITOR) 10 MG tablet Take 1 tablet (10 mg total) by mouth daily. (Patient not taking: Reported on 02/12/2015) 90 tablet 3   No current facility-administered medications on file prior to visit.   No Known Allergies History   Social History  . Marital Status: Married    Spouse Name: N/A  . Number of Children: N/A  . Years of Education: N/A   Occupational History  . Not on file.   Social History Main Topics  . Smoking status: Current Some Day Smoker    Types: Cigars  . Smokeless tobacco: Not on file  . Alcohol Use: 4.2 oz/week    7 Cans of beer per week  . Drug Use: No  . Sexual Activity: Not on file   Other Topics Concern  . Not on file   Social History Narrative   No family history on  file.   Review of Systems  All other systems reviewed and are negative.      Objective:   Physical Exam  Constitutional: He is oriented to person, place, and time. He appears well-developed and well-nourished. No distress.  HENT:  Head: Normocephalic and atraumatic.  Right Ear: External ear normal.  Left Ear: External ear normal.  Nose: Nose normal.  Mouth/Throat: Oropharynx is clear and moist. No oropharyngeal exudate.  Eyes: Conjunctivae and EOM are normal. Pupils are equal, round, and reactive to light. Right eye exhibits no discharge. Left eye exhibits no discharge. No scleral icterus.  Neck: Normal range of motion. Neck supple. No JVD present. No tracheal deviation present. No thyromegaly present.  Cardiovascular: Normal rate, regular rhythm, normal heart sounds and intact distal pulses.  Exam reveals no gallop and no friction rub.   No murmur heard. Pulmonary/Chest: Effort normal and breath sounds normal. No stridor. No respiratory distress. He has no wheezes. He has no rales. He exhibits no tenderness.  Abdominal: Soft. Bowel sounds are normal. He exhibits no distension and no mass. There is no tenderness. There is no rebound and no guarding.  Genitourinary: Rectum normal, prostate normal and penis normal.  Musculoskeletal: Normal range of motion. He exhibits no edema or tenderness.  Lymphadenopathy:    He has no cervical adenopathy.  Neurological: He is alert and oriented to person, place, and time. He has normal reflexes. He displays normal reflexes. No cranial nerve deficit. He exhibits normal muscle tone. Coordination normal.  Skin: Skin is warm. No rash noted. He is not diaphoretic. No erythema. No pallor.  Psychiatric: He has a normal mood and affect. His behavior is normal. Judgment and thought content normal.  Vitals reviewed.         Assessment & Plan:  Routine general medical examination at a health care facility  Prostate cancer screening - Plan: PSA,  Medicare  Essential hypertension - Plan: COMPLETE METABOLIC PANEL WITH GFR, CBC with Differential/Platelet, Lipid panel  Physical exam today is normal. I did recommend diet exercise and weight loss. Colonoscopy is up-to-date. Prostate exam is normal. I will check a PSA as last time his PSA was elevated at 3.9. Patient received Prevnar 13 today. He declined the tetanus vaccine as well as the shingles vaccine. Blood pressures well controlled. I continue to recommend smoking cessation. He has been off his cholesterol medicine for 2 months now. I will check a fasting lipid panel. Goal LDL is greater than 130, I last the patient to resume Lipitor

## 2015-02-12 NOTE — Addendum Note (Signed)
Addended by: Shary Decamp B on: 02/12/2015 11:07 AM   Modules accepted: Orders

## 2015-02-13 LAB — PSA, MEDICARE: PSA: 3.74 ng/mL (ref <=4.00)

## 2015-02-15 ENCOUNTER — Other Ambulatory Visit: Payer: Self-pay | Admitting: Family Medicine

## 2015-02-15 MED ORDER — ATORVASTATIN CALCIUM 10 MG PO TABS: 10.0000 mg | ORAL_TABLET | Freq: Every day | ORAL | Status: DC

## 2015-02-16 ENCOUNTER — Other Ambulatory Visit: Payer: Self-pay | Admitting: Family Medicine

## 2015-02-16 NOTE — Telephone Encounter (Signed)
ok 

## 2015-02-16 NOTE — Telephone Encounter (Signed)
?   OK to Refill  

## 2015-02-16 NOTE — Telephone Encounter (Signed)
RX called in .

## 2015-03-10 ENCOUNTER — Other Ambulatory Visit: Payer: Self-pay | Admitting: Family Medicine

## 2015-03-10 MED ORDER — COLCHICINE 0.6 MG PO TABS: 0.6000 mg | ORAL_TABLET | Freq: Every day | ORAL | Status: DC

## 2015-03-10 NOTE — Telephone Encounter (Signed)
Colchicine printed and faxed to [place] [facility]

## 2015-06-11 ENCOUNTER — Encounter: Payer: Self-pay | Admitting: Family Medicine

## 2015-06-11 ENCOUNTER — Other Ambulatory Visit: Payer: Self-pay | Admitting: Family Medicine

## 2015-06-11 NOTE — Telephone Encounter (Signed)
Medication refill for one time only.  Patient needs to be seen.  Letter sent for patient to call and schedule 

## 2015-07-05 ENCOUNTER — Other Ambulatory Visit: Payer: Self-pay | Admitting: Family Medicine

## 2015-07-05 ENCOUNTER — Ambulatory Visit (INDEPENDENT_AMBULATORY_CARE_PROVIDER_SITE_OTHER): Payer: Medicare Other | Admitting: Family Medicine

## 2015-07-05 ENCOUNTER — Encounter: Payer: Self-pay | Admitting: Family Medicine

## 2015-07-05 VITALS — BP 122/78 | HR 70 | Temp 97.8°F | Resp 18 | Wt 211.0 lb

## 2015-07-05 DIAGNOSIS — I10 Essential (primary) hypertension: Secondary | ICD-10-CM | POA: Diagnosis not present

## 2015-07-05 DIAGNOSIS — E785 Hyperlipidemia, unspecified: Secondary | ICD-10-CM

## 2015-07-05 DIAGNOSIS — R7309 Other abnormal glucose: Secondary | ICD-10-CM | POA: Diagnosis not present

## 2015-07-05 LAB — LIPID PANEL
Cholesterol: 183 mg/dL (ref 125–200)
HDL: 42 mg/dL (ref >=40)
LDL Cholesterol: 110 mg/dL (ref <130)
Total CHOL/HDL Ratio: 4.4 Ratio (ref <=5.0)
Triglycerides: 155 mg/dL — ABNORMAL HIGH (ref <150)
VLDL: 31 mg/dL — ABNORMAL HIGH (ref <30)

## 2015-07-05 LAB — COMPLETE METABOLIC PANEL WITH GFR
ALT: 29 U/L (ref 9–46)
AST: 21 U/L (ref 10–35)
Albumin: 4 g/dL (ref 3.6–5.1)
Alkaline Phosphatase: 63 U/L (ref 40–115)
BUN: 15 mg/dL (ref 7–25)
CO2: 22 mmol/L (ref 20–31)
Calcium: 8.9 mg/dL (ref 8.6–10.3)
Chloride: 107 mmol/L (ref 98–110)
Creat: 1.03 mg/dL (ref 0.70–1.18)
GFR, Est African American: 84 mL/min (ref >=60)
GFR, Est Non African American: 73 mL/min (ref >=60)
Glucose, Bld: 128 mg/dL — ABNORMAL HIGH (ref 70–99)
Potassium: 4.2 mmol/L (ref 3.5–5.3)
Sodium: 140 mmol/L (ref 135–146)
Total Bilirubin: 0.6 mg/dL (ref 0.2–1.2)
Total Protein: 6.6 g/dL (ref 6.1–8.1)

## 2015-07-05 NOTE — Progress Notes (Signed)
Subjective:    Patient ID: Travis Singh, male    DOB: 08-25-1946, 69 y.o.   MRN: [id number]  HPI  02/12/15 Patient is a very pleasant 69 year old African-American male who is here today for a physical exam. His last colonoscopy was in 2009. He is due again in 2019. He is due for prostate exam today. Patient has had Pneumovax 23. He is due for Prevnar 13. He is also due for the shingles vaccine. He declined the shingles vaccine today. He also declines a tetanus shot today. He is due for fasting lab work.  At that time, my plan was: Physical exam today is normal. I did recommend diet exercise and weight loss. Colonoscopy is up-to-date. Prostate exam is normal. I will check a PSA as last time his PSA was elevated at 3.9. Patient received Prevnar 13 today. He declined the tetanus vaccine as well as the shingles vaccine. Blood pressures well controlled. I continue to recommend smoking cessation. He has been off his cholesterol medicine for 2 months now. I will check a fasting lipid panel. Goal LDL is greater than 130, I last the patient to resume Lipitor  07/05/15 At that time, LDL was 197.  I asked the patient to resume lipitor 10 mg poqday and he is here today to recheck FLP.  He denies any myalgias or right upper quadrant pain. Unfortunately he is only been able to remember to take the medicine approximately 50% of the time. Patient states it would be better for him to take the medication in the morning, so that he can remember to take it everyday. He declines a flu shot today Past Medical History  Diagnosis Date  . High cholesterol   . Hypertension   . Gout   . 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 Current Outpatient Prescriptions on File Prior to Visit  Medication Sig Dispense Refill  . atorvastatin (LIPITOR) 10 MG tablet Take 1 tablet (10 mg total) by mouth daily. 30 tablet 3  . colchicine 0.6 MG tablet Take 1 tablet (0.6 mg  total) by mouth daily. 100 tablet 1  . cyclobenzaprine (FLEXERIL) 10 MG tablet TAKE ONE TABLET BY MOUTH THREE TIMES DAILY AS NEEDED FOR MUSCLE SPASM 90 tablet 0  . lisinopril (PRINIVIL,ZESTRIL) 20 MG tablet TAKE ONE TABLET BY MOUTH ONCE DAILY 30 tablet 0  . verapamil (CALAN-SR) 240 MG CR tablet TAKE ONE TABLET BY MOUTH ONCE DAILY 30 tablet 0   No current facility-administered medications on file prior to visit.   No Known Allergies Social History   Social History  . Marital Status: Married    Spouse Name: N/A  . Number of Children: N/A  . Years of Education: N/A   Occupational History  . Not on file.   Social History Main Topics  . Smoking status: Current Some Day Smoker    Types: Cigars  . Smokeless tobacco: Not on file  . Alcohol Use: 4.2 oz/week    7 Cans of beer per week  . Drug Use: No  . Sexual Activity: Not on file   Other Topics Concern  . Not on file   Social History Narrative   No family history on file.   Review of Systems  All other systems reviewed and are negative.      Objective:   Physical Exam  Constitutional: He appears well-developed and well-nourished. No distress.  Cardiovascular: Normal rate, regular rhythm, normal  heart sounds and intact distal pulses.  Exam reveals no gallop and no friction rub.   No murmur heard. Pulmonary/Chest: Effort normal and breath sounds normal. No respiratory distress. He has no wheezes. He has no rales. He exhibits no tenderness.  Abdominal: Soft. Bowel sounds are normal. He exhibits no distension and no mass. There is no tenderness. There is no rebound and no guarding.  Musculoskeletal: He exhibits no edema.  Skin: He is not diaphoretic.  Vitals reviewed.         Assessment & Plan:  Essential hypertension  HLD (hyperlipidemia) - Plan: COMPLETE METABOLIC PANEL WITH GFR, Lipid panel  Blood pressure is well controlled. Continue current medications at their present dosages. I will check a fasting lipid  panel. If LDL cholesterol is close to 130, I would just encourage the patient to take Lipitor every day. If the cholesterol is greater than 130, we may need to increase the medication substantially

## 2015-07-07 LAB — HEMOGLOBIN A1C
Hgb A1c MFr Bld: 6.7 % — ABNORMAL HIGH (ref <5.7)
Mean Plasma Glucose: 146 mg/dL — ABNORMAL HIGH (ref <117)

## 2015-07-16 ENCOUNTER — Other Ambulatory Visit: Payer: Self-pay | Admitting: Family Medicine

## 2015-07-16 MED ORDER — LISINOPRIL 20 MG PO TABS: 20.0000 mg | ORAL_TABLET | Freq: Every day | ORAL | Status: DC

## 2015-07-16 MED ORDER — ATORVASTATIN CALCIUM 10 MG PO TABS: 10.0000 mg | ORAL_TABLET | Freq: Every day | ORAL | Status: DC

## 2015-07-16 MED ORDER — VERAPAMIL HCL ER 240 MG PO TBCR: 240.0000 mg | EXTENDED_RELEASE_TABLET | Freq: Every day | ORAL | Status: DC

## 2015-07-16 NOTE — Telephone Encounter (Signed)
Medication refilled and sent to [facility]

## 2015-07-16 NOTE — Telephone Encounter (Signed)
Pharmacy:[facility] [place]  Medication:verapamil HCL Tab  Cr 240 mg  Qty:30  Sig:1 tablet by mouth  Physician: Dennard Schaumann  Patient's phone [phone]  Medication:lisionopril 20 mg  Qty:30  Sig:1 tablet by mouth  Physician: Dennard Schaumann  Patient's phone [phone]  Medication: Lipitor   Qty:10  Sig:1 tablet by mouth  Physician: Pickard  Patient's phone [phone]

## 2015-10-14 ENCOUNTER — Encounter: Payer: Self-pay | Admitting: Family Medicine

## 2015-10-14 ENCOUNTER — Ambulatory Visit (INDEPENDENT_AMBULATORY_CARE_PROVIDER_SITE_OTHER): Payer: Medicare Other | Admitting: Family Medicine

## 2015-10-14 VITALS — BP 128/74 | HR 80 | Temp 98.2°F | Resp 16 | Ht 68.5 in | Wt 209.0 lb

## 2015-10-14 DIAGNOSIS — E119 Type 2 diabetes mellitus without complications: Secondary | ICD-10-CM | POA: Diagnosis not present

## 2015-10-14 LAB — COMPLETE METABOLIC PANEL WITH GFR
ALT: 23 U/L (ref 9–46)
AST: 19 U/L (ref 10–35)
Albumin: 3.9 g/dL (ref 3.6–5.1)
Alkaline Phosphatase: 51 U/L (ref 40–115)
BUN: 21 mg/dL (ref 7–25)
CO2: 22 mmol/L (ref 20–31)
Calcium: 9.2 mg/dL (ref 8.6–10.3)
Chloride: 108 mmol/L (ref 98–110)
Creat: 1.02 mg/dL (ref 0.70–1.18)
GFR, Est African American: 85 mL/min (ref >=60)
GFR, Est Non African American: 74 mL/min (ref >=60)
Glucose, Bld: 106 mg/dL — ABNORMAL HIGH (ref 70–99)
Potassium: 4.6 mmol/L (ref 3.5–5.3)
Sodium: 139 mmol/L (ref 135–146)
Total Bilirubin: 0.6 mg/dL (ref 0.2–1.2)
Total Protein: 6.5 g/dL (ref 6.1–8.1)

## 2015-10-14 LAB — LIPID PANEL
Cholesterol: 158 mg/dL (ref 125–200)
HDL: 37 mg/dL — ABNORMAL LOW (ref >=40)
LDL Cholesterol: 98 mg/dL (ref <130)
Total CHOL/HDL Ratio: 4.3 Ratio (ref <=5.0)
Triglycerides: 115 mg/dL (ref <150)
VLDL: 23 mg/dL (ref <30)

## 2015-10-14 NOTE — Progress Notes (Signed)
Subjective:    Patient ID: Travis Singh, male    DOB: 1946/06/19, 70 y.o.   MRN: [id number]  HPI  02/12/15 Patient is a very pleasant 70 year old African-American male who is here today for a physical exam. His last colonoscopy was in 2009. He is due again in 2019. He is due for prostate exam today. Patient has had Pneumovax 23. He is due for Prevnar 13. He is also due for the shingles vaccine. He declined the shingles vaccine today. He also declines a tetanus shot today. He is due for fasting lab work.  At that time, my plan was: Physical exam today is normal. I did recommend diet exercise and weight loss. Colonoscopy is up-to-date. Prostate exam is normal. I will check a PSA as last time his PSA was elevated at 3.9. Patient received Prevnar 13 today. He declined the tetanus vaccine as well as the shingles vaccine. Blood pressures well controlled. I continue to recommend smoking cessation. He has been off his cholesterol medicine for 2 months now. I will check a fasting lipid panel. Goal LDL is greater than 130, I last the patient to resume Lipitor  07/05/15 At that time, LDL was 197.  I asked the patient to resume lipitor 10 mg poqday and he is here today to recheck FLP.  He denies any myalgias or right upper quadrant pain. Unfortunately he is only been able to remember to take the medicine approximately 50% of the time. Patient states it would be better for him to take the medication in the morning, so that he can remember to take it everyday. He declines a flu shot today.  At that time, my plan was: Blood pressure is well controlled. Continue current medications at their present dosages. I will check a fasting lipid panel. If LDL cholesterol is close to 130, I would just encourage the patient to take Lipitor every day. If the cholesterol is greater than 130, we may need to increase the medication substantially  10/14/15 Here for follow up.  At the patient's last visit, his blood sugar was found  to be elevated and a subsequent hemoglobin A1c was found to be 6.7. He is here today for follow-up. He is due for diabetic eye exam. Pneumonia vaccines are up-to-date. He is due for a diabetic foot exam. He has been watching his diet and trying to limit his carbohydrate intake Past Medical History  Diagnosis Date  . High cholesterol   . Hypertension   . Gout   . 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 Current Outpatient Prescriptions on File Prior to Visit  Medication Sig Dispense Refill  . atorvastatin (LIPITOR) 10 MG tablet Take 1 tablet (10 mg total) by mouth daily. 30 tablet 3  . colchicine 0.6 MG tablet Take 1 tablet (0.6 mg total) by mouth daily. 100 tablet 1  . cyclobenzaprine (FLEXERIL) 10 MG tablet TAKE ONE TABLET BY MOUTH THREE TIMES DAILY AS NEEDED FOR MUSCLE SPASM 90 tablet 0  . lisinopril (PRINIVIL,ZESTRIL) 20 MG tablet Take 1 tablet (20 mg total) by mouth daily. 30 tablet 3  . verapamil (CALAN-SR) 240 MG CR tablet Take 1 tablet (240 mg total) by mouth daily. 30 tablet 3   No current facility-administered medications on file prior to visit.   No Known Allergies Social History   Social History  . Marital Status: Married    Spouse Name: N/A  . Number of  Children: N/A  . Years of Education: N/A   Occupational History  . Not on file.   Social History Main Topics  . Smoking status: Current Some Day Smoker    Types: Cigars  . Smokeless tobacco: Not on file  . Alcohol Use: 4.2 oz/week    7 Cans of beer per week  . Drug Use: No  . Sexual Activity: Not on file   Other Topics Concern  . Not on file   Social History Narrative   No family history on file.   Review of Systems  All other systems reviewed and are negative.      Objective:   Physical Exam  Constitutional: He appears well-developed and well-nourished. No distress.  Cardiovascular: Normal rate, regular rhythm, normal heart sounds and intact  distal pulses.  Exam reveals no gallop and no friction rub.   No murmur heard. Pulmonary/Chest: Effort normal and breath sounds normal. No respiratory distress. He has no wheezes. He has no rales. He exhibits no tenderness.  Abdominal: Soft. Bowel sounds are normal. He exhibits no distension and no mass. There is no tenderness. There is no rebound and no guarding.  Musculoskeletal: He exhibits no edema.  Skin: He is not diaphoretic.  Vitals reviewed.         Assessment & Plan:  Controlled type 2 diabetes mellitus without complication, without long-term current use of insulin ([place]) - Plan: COMPLETE METABOLIC PANEL WITH GFR, Hemoglobin A1c, Microalbumin, urine, Lipid panel  Patient's blood pressure is excellent. I will recheck a hemoglobin A1c. Goal hemoglobin A1c is less than 6.5. I spent 15 minutes discussing a low carbohydrate diet with the patient. I will also check a fasting lipid panel and I will increase his Lipitor if his LDL cholesterol is greater than 100. I recommended a diabetic eye exam. I will check a urine microalbumin. Diabetic foot exam is normal.

## 2015-10-15 LAB — HEMOGLOBIN A1C
Hgb A1c MFr Bld: 6.4 % — ABNORMAL HIGH (ref <5.7)
Mean Plasma Glucose: 137 mg/dL — ABNORMAL HIGH (ref <117)

## 2015-10-15 LAB — MICROALBUMIN, URINE: Microalb, Ur: 66.9 mg/dL

## 2015-10-18 ENCOUNTER — Other Ambulatory Visit: Payer: Self-pay | Admitting: *Deleted

## 2015-10-18 DIAGNOSIS — R809 Proteinuria, unspecified: Secondary | ICD-10-CM

## 2015-10-18 MED ORDER — LISINOPRIL 40 MG PO TABS: 40.0000 mg | ORAL_TABLET | Freq: Every day | ORAL | Status: DC

## 2015-11-27 ENCOUNTER — Other Ambulatory Visit: Payer: Self-pay | Admitting: Family Medicine

## 2015-12-09 ENCOUNTER — Other Ambulatory Visit: Payer: Self-pay | Admitting: Family Medicine

## 2015-12-10 ENCOUNTER — Other Ambulatory Visit: Payer: Self-pay | Admitting: Family Medicine

## 2015-12-10 NOTE — Telephone Encounter (Signed)
rx called in

## 2015-12-10 NOTE — Telephone Encounter (Signed)
Ok to refill 

## 2015-12-10 NOTE — Telephone Encounter (Signed)
ok 

## 2015-12-10 NOTE — Telephone Encounter (Signed)
LRF 02/16/15 #90   LOV 10/14/15  OK refill?

## 2015-12-10 NOTE — Telephone Encounter (Signed)
Prescription sent to pharmacy.

## 2016-01-03 ENCOUNTER — Encounter: Payer: Self-pay | Admitting: Family Medicine

## 2016-01-21 ENCOUNTER — Encounter: Payer: Self-pay | Admitting: Family Medicine

## 2016-01-21 ENCOUNTER — Ambulatory Visit (INDEPENDENT_AMBULATORY_CARE_PROVIDER_SITE_OTHER): Payer: Medicare Other | Admitting: Family Medicine

## 2016-01-21 VITALS — BP 146/70 | HR 62 | Temp 98.1°F | Resp 16 | Ht 68.5 in | Wt 205.0 lb

## 2016-01-21 DIAGNOSIS — I10 Essential (primary) hypertension: Secondary | ICD-10-CM | POA: Diagnosis not present

## 2016-01-21 DIAGNOSIS — E119 Type 2 diabetes mellitus without complications: Secondary | ICD-10-CM | POA: Diagnosis not present

## 2016-01-21 DIAGNOSIS — E785 Hyperlipidemia, unspecified: Secondary | ICD-10-CM

## 2016-01-21 LAB — COMPLETE METABOLIC PANEL WITH GFR
ALT: 15 U/L (ref 9–46)
AST: 17 U/L (ref 10–35)
Albumin: 3.8 g/dL (ref 3.6–5.1)
Alkaline Phosphatase: 52 U/L (ref 40–115)
BUN: 19 mg/dL (ref 7–25)
CO2: 24 mmol/L (ref 20–31)
Calcium: 8.8 mg/dL (ref 8.6–10.3)
Chloride: 106 mmol/L (ref 98–110)
Creat: 1.09 mg/dL (ref 0.70–1.18)
GFR, Est African American: 78 mL/min (ref >=60)
GFR, Est Non African American: 67 mL/min (ref >=60)
Glucose, Bld: 99 mg/dL (ref 70–99)
Potassium: 4.5 mmol/L (ref 3.5–5.3)
Sodium: 140 mmol/L (ref 135–146)
Total Bilirubin: 0.8 mg/dL (ref 0.2–1.2)
Total Protein: 6.2 g/dL (ref 6.1–8.1)

## 2016-01-21 LAB — LIPID PANEL
Cholesterol: 166 mg/dL (ref 125–200)
HDL: 43 mg/dL (ref >=40)
LDL Cholesterol: 102 mg/dL (ref <130)
Total CHOL/HDL Ratio: 3.9 Ratio (ref <=5.0)
Triglycerides: 105 mg/dL (ref <150)
VLDL: 21 mg/dL (ref <30)

## 2016-01-21 LAB — HEMOGLOBIN A1C
Hgb A1c MFr Bld: 6.2 % — ABNORMAL HIGH (ref <5.7)
Mean Plasma Glucose: 131 mg/dL

## 2016-01-21 NOTE — Progress Notes (Signed)
Subjective:    Patient ID: Travis Singh, male    DOB: 1945-10-30, 70 y.o.   MRN: [id number]  HPI  02/12/15 Patient is a very pleasant 70 year old African-American male who is here today for a physical exam. His last colonoscopy was in 2009. He is due again in 2019. He is due for prostate exam today. Patient has had Pneumovax 23. He is due for Prevnar 13. He is also due for the shingles vaccine. He declined the shingles vaccine today. He also declines a tetanus shot today. He is due for fasting lab work.  At that time, my plan was: Physical exam today is normal. I did recommend diet exercise and weight loss. Colonoscopy is up-to-date. Prostate exam is normal. I will check a PSA as last time his PSA was elevated at 3.9. Patient received Prevnar 13 today. He declined the tetanus vaccine as well as the shingles vaccine. Blood pressures well controlled. I continue to recommend smoking cessation. He has been off his cholesterol medicine for 2 months now. I will check a fasting lipid panel. Goal LDL is greater than 130, I last the patient to resume Lipitor  07/05/15 At that time, LDL was 197.  I asked the patient to resume lipitor 10 mg poqday and he is here today to recheck FLP.  He denies any myalgias or right upper quadrant pain. Unfortunately he is only been able to remember to take the medicine approximately 50% of the time. Patient states it would be better for him to take the medication in the morning, so that he can remember to take it everyday. He declines a flu shot today.  At that time, my plan was: Blood pressure is well controlled. Continue current medications at their present dosages. I will check a fasting lipid panel. If LDL cholesterol is close to 130, I would just encourage the patient to take Lipitor every day. If the cholesterol is greater than 130, we may need to increase the medication substantially  10/14/15 Here for follow up.  At the patient's last visit, his blood sugar was found  to be elevated and a subsequent hemoglobin A1c was found to be 6.7. He is here today for follow-up. He is due for diabetic eye exam. Pneumonia vaccines are up-to-date. He is due for a diabetic foot exam. He has been watching his diet and trying to limit his carbohydrate intake.  At that time, my plan was: Patient's blood pressure is excellent. I will recheck a hemoglobin A1c. Goal hemoglobin A1c is less than 6.5. I spent 15 minutes discussing a low carbohydrate diet with the patient. I will also check a fasting lipid panel and I will increase his Lipitor if his LDL cholesterol is greater than 100. I recommended a diabetic eye exam. I will check a urine microalbumin. Diabetic foot exam is normal.  01/21/16 Patient is here today for follow-up. His last hemoglobin A1c was excellent 6.4. He denies any polyuria, polydipsia, or blurred vision. He is scheduled for an eye exam next week. He denies any chest pain shortness of breath or dyspnea on exertion. His blood pressure today is slightly elevated at 146/70. He is not due for his next PSA until June. He denies any myalgias or right upper quadrant pain on Lipitor. He is here today for fasting lab work Past Medical History  Diagnosis Date  . High cholesterol   . Hypertension   . Gout   . Right sided sciatica     severe foraminal stenosis at  l4-l5   No past surgical history on file. Current Outpatient Prescriptions on File Prior to Visit  Medication Sig Dispense Refill  . atorvastatin (LIPITOR) 10 MG tablet Take 1 tablet (10 mg total) by mouth daily. 30 tablet 3  . colchicine 0.6 MG tablet Take 1 tablet (0.6 mg total) by mouth daily. 100 tablet 1  . cyclobenzaprine (FLEXERIL) 10 MG tablet TAKE ONE TABLET BY MOUTH THREE TIMES DAILY AS NEEDED 90 tablet 0  . lisinopril (PRINIVIL,ZESTRIL) 40 MG tablet Take 1 tablet (40 mg total) by mouth daily. 30 tablet 3  . verapamil (CALAN-SR) 240 MG CR tablet TAKE ONE TABLET BY MOUTH ONCE DAILY 30 tablet 5   No current  facility-administered medications on file prior to visit.   No Known Allergies Social History   Social History  . Marital Status: Married    Spouse Name: N/A  . Number of Children: N/A  . Years of Education: N/A   Occupational History  . Not on file.   Social History Main Topics  . Smoking status: Current Some Day Smoker    Types: Cigars  . Smokeless tobacco: Not on file  . Alcohol Use: 4.2 oz/week    7 Cans of beer per week  . Drug Use: No  . Sexual Activity: Not on file   Other Topics Concern  . Not on file   Social History Narrative   No family history on file.   Review of Systems  All other systems reviewed and are negative.      Objective:   Physical Exam  Constitutional: He appears well-developed and well-nourished. No distress.  Cardiovascular: Normal rate, regular rhythm, normal heart sounds and intact distal pulses.  Exam reveals no gallop and no friction rub.   No murmur heard. Pulmonary/Chest: Effort normal and breath sounds normal. No respiratory distress. He has no wheezes. He has no rales. He exhibits no tenderness.  Abdominal: Soft. Bowel sounds are normal. He exhibits no distension and no mass. There is no tenderness. There is no rebound and no guarding.  Musculoskeletal: He exhibits no edema.  Skin: He is not diaphoretic.  Vitals reviewed.         Assessment & Plan:  Controlled type 2 diabetes mellitus without complication, without long-term current use of insulin ([place]) - Plan: COMPLETE METABOLIC PANEL WITH GFR, Lipid panel, Hemoglobin A1c, Microalbumin, urine  Essential hypertension  HLD (hyperlipidemia) Blood pressure slightly high. I will have the patient check his blood pressure everyday at home. He will notify me of the values in one or 2 weeks. If his systolic blood pressure remains greater than 140, I would add atenolol. Recheck a hemoglobin A1c. If hemoglobin A1c is less than 6.5, I would recheck diabetes tests in 6 months. I will  also check a urine microalbumin as well as a fasting lipid panel. Goal LDL cholesterol is less than 100

## 2016-01-22 LAB — MICROALBUMIN, URINE: Microalb, Ur: 105.8 mg/dL

## 2016-01-24 ENCOUNTER — Encounter: Payer: Self-pay | Admitting: Family Medicine

## 2016-01-27 DIAGNOSIS — H40013 Open angle with borderline findings, low risk, bilateral: Secondary | ICD-10-CM | POA: Diagnosis not present

## 2016-01-27 DIAGNOSIS — H11159 Pinguecula, unspecified eye: Secondary | ICD-10-CM | POA: Diagnosis not present

## 2016-01-27 DIAGNOSIS — H25099 Other age-related incipient cataract, unspecified eye: Secondary | ICD-10-CM | POA: Diagnosis not present

## 2016-01-27 DIAGNOSIS — H18419 Arcus senilis, unspecified eye: Secondary | ICD-10-CM | POA: Diagnosis not present

## 2016-01-27 DIAGNOSIS — H52222 Regular astigmatism, left eye: Secondary | ICD-10-CM | POA: Diagnosis not present

## 2016-01-27 DIAGNOSIS — H524 Presbyopia: Secondary | ICD-10-CM | POA: Diagnosis not present

## 2016-01-27 DIAGNOSIS — H5203 Hypermetropia, bilateral: Secondary | ICD-10-CM | POA: Diagnosis not present

## 2016-03-10 ENCOUNTER — Other Ambulatory Visit: Payer: Self-pay | Admitting: Family Medicine

## 2016-03-14 NOTE — Telephone Encounter (Signed)
Refill appropriate and filled per protocol. 

## 2016-04-24 ENCOUNTER — Encounter: Payer: Self-pay | Admitting: Family Medicine

## 2016-04-24 ENCOUNTER — Ambulatory Visit (INDEPENDENT_AMBULATORY_CARE_PROVIDER_SITE_OTHER): Payer: Medicare Other | Admitting: Family Medicine

## 2016-04-24 VITALS — BP 162/74 | HR 60 | Temp 98.0°F | Resp 14 | Ht 68.5 in | Wt 206.0 lb

## 2016-04-24 DIAGNOSIS — E785 Hyperlipidemia, unspecified: Secondary | ICD-10-CM | POA: Diagnosis not present

## 2016-04-24 DIAGNOSIS — I10 Essential (primary) hypertension: Secondary | ICD-10-CM

## 2016-04-24 DIAGNOSIS — R7303 Prediabetes: Secondary | ICD-10-CM

## 2016-04-24 LAB — CBC WITH DIFFERENTIAL/PLATELET
Basophils Absolute: 0 cells/uL (ref 0–200)
Basophils Relative: 0 %
Eosinophils Absolute: 92 cells/uL (ref 15–500)
Eosinophils Relative: 2 %
HCT: 44.5 % (ref 38.5–50.0)
Hemoglobin: 14.6 g/dL (ref 13.0–17.0)
Lymphocytes Relative: 32 %
Lymphs Abs: 1472 cells/uL (ref 850–3900)
MCH: 27.9 pg (ref 27.0–33.0)
MCHC: 32.8 g/dL (ref 32.0–36.0)
MCV: 85.1 fL (ref 80.0–100.0)
MPV: 10.2 fL (ref 7.5–12.5)
Monocytes Absolute: 460 cells/uL (ref 200–950)
Monocytes Relative: 10 %
Neutro Abs: 2576 cells/uL (ref 1500–7800)
Neutrophils Relative %: 56 %
Platelets: 178 10*3/uL (ref 140–400)
RBC: 5.23 MIL/uL (ref 4.20–5.80)
RDW: 14.6 % (ref 11.0–15.0)
WBC: 4.6 10*3/uL (ref 3.8–10.8)

## 2016-04-24 LAB — COMPLETE METABOLIC PANEL WITH GFR
ALT: 17 U/L (ref 9–46)
AST: 16 U/L (ref 10–35)
Albumin: 4 g/dL (ref 3.6–5.1)
Alkaline Phosphatase: 53 U/L (ref 40–115)
BUN: 19 mg/dL (ref 7–25)
CO2: 22 mmol/L (ref 20–31)
Calcium: 8.9 mg/dL (ref 8.6–10.3)
Chloride: 108 mmol/L (ref 98–110)
Creat: 1.04 mg/dL (ref 0.70–1.18)
GFR, Est African American: 83 mL/min (ref >=60)
GFR, Est Non African American: 71 mL/min (ref >=60)
Glucose, Bld: 104 mg/dL — ABNORMAL HIGH (ref 70–99)
Potassium: 4.4 mmol/L (ref 3.5–5.3)
Sodium: 140 mmol/L (ref 135–146)
Total Bilirubin: 0.6 mg/dL (ref 0.2–1.2)
Total Protein: 6.4 g/dL (ref 6.1–8.1)

## 2016-04-24 LAB — HEMOGLOBIN A1C
Hgb A1c MFr Bld: 6.2 % — ABNORMAL HIGH (ref <5.7)
Mean Plasma Glucose: 131 mg/dL

## 2016-04-24 LAB — LIPID PANEL
Cholesterol: 184 mg/dL (ref 125–200)
HDL: 41 mg/dL (ref >=40)
LDL Cholesterol: 114 mg/dL (ref <130)
Total CHOL/HDL Ratio: 4.5 Ratio (ref <=5.0)
Triglycerides: 145 mg/dL (ref <150)
VLDL: 29 mg/dL (ref <30)

## 2016-04-24 MED ORDER — HYDROCHLOROTHIAZIDE 25 MG PO TABS: 25.0000 mg | ORAL_TABLET | Freq: Every day | ORAL | 3 refills | Status: DC

## 2016-04-24 NOTE — Progress Notes (Signed)
Subjective:    Patient ID: Travis Singh, male    DOB: [birth year], 70 y.o.   MRN: [id number]  HPI 02/12/15 Patient is a very pleasant 70 year old African-American male who is here today for a physical exam. His last colonoscopy was in 2009. He is due again in 2019. He is due for prostate exam today. Patient has had Pneumovax 23. He is due for Prevnar 13. He is also due for the shingles vaccine. He declined the shingles vaccine today. He also declines a tetanus shot today. He is due for fasting lab work.  At that time, my plan was: Physical exam today is normal. I did recommend diet exercise and weight loss. Colonoscopy is up-to-date. Prostate exam is normal. I will check a PSA as last time his PSA was elevated at 3.9. Patient received Prevnar 13 today. He declined the tetanus vaccine as well as the shingles vaccine. Blood pressures well controlled. I continue to recommend smoking cessation. He has been off his cholesterol medicine for 2 months now. I will check a fasting lipid panel. Goal LDL is greater than 130, I last the patient to resume Lipitor  07/05/15 At that time, LDL was 197.  I asked the patient to resume lipitor 10 mg poqday and he is here today to recheck FLP.  He denies any myalgias or right upper quadrant pain. Unfortunately he is only been able to remember to take the medicine approximately 50% of the time. Patient states it would be better for him to take the medication in the morning, so that he can remember to take it everyday. He declines a flu shot today.  At that time, my plan was: Blood pressure is well controlled. Continue current medications at their present dosages. I will check a fasting lipid panel. If LDL cholesterol is close to 130, I would just encourage the patient to take Lipitor every day. If the cholesterol is greater than 130, we may need to increase the medication substantially  10/14/15 Here for follow up.  At the patient's last visit, his blood sugar was found  to be elevated and a subsequent hemoglobin A1c was found to be 6.7. He is here today for follow-up. He is due for diabetic eye exam. Pneumonia vaccines are up-to-date. He is due for a diabetic foot exam. He has been watching his diet and trying to limit his carbohydrate intake.  At that time, my plan was: Patient's blood pressure is excellent. I will recheck a hemoglobin A1c. Goal hemoglobin A1c is less than 6.5. I spent 15 minutes discussing a low carbohydrate diet with the patient. I will also check a fasting lipid panel and I will increase his Lipitor if his LDL cholesterol is greater than 100. I recommended a diabetic eye exam. I will check a urine microalbumin. Diabetic foot exam is normal.  01/21/16 Patient is here today for follow-up. His last hemoglobin A1c was excellent 6.4. He denies any polyuria, polydipsia, or blurred vision. He is scheduled for an eye exam next week. He denies any chest pain shortness of breath or dyspnea on exertion. His blood pressure today is slightly elevated at 146/70. He is not due for his next PSA until June. He denies any myalgias or right upper quadrant pain on Lipitor. He is here today for fasting lab work.  At that time, my plan was: Blood pressure slightly high. I will have the patient check his blood pressure everyday at home. He will notify me of the values in one or  2 weeks. If his systolic blood pressure remains greater than 140, I would add atenolol. Recheck a hemoglobin A1c. If hemoglobin A1c is less than 6.5, I would recheck diabetes tests in 6 months. I will also check a urine microalbumin as well as a fasting lipid panel. Goal LDL cholesterol is less than 100  04/24/16 Curently, patient is managing his diabetes through diet. Recent hemoglobin A1c was 6.2. He is not adhering to a low carbohydrate diet. He denies any polyuria, polydipsia, or blurred vision. However his blood pressure significantly elevated. He does have a history of gout one time in the past. He  has not had any gout flares in the recent past. He denies any chest pain shortness of breath or dyspnea on exertion. He denies any myalgias or right upper quadrant pain. Past Medical History:  Diagnosis Date  . Gout   . High cholesterol   . Hypertension   . 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 Current Outpatient Prescriptions on File Prior to Visit  Medication Sig Dispense Refill  . atorvastatin (LIPITOR) 10 MG tablet Take 1 tablet (10 mg total) by mouth daily. 30 tablet 3  . colchicine 0.6 MG tablet Take 1 tablet (0.6 mg total) by mouth daily. 100 tablet 1  . cyclobenzaprine (FLEXERIL) 10 MG tablet TAKE ONE TABLET BY MOUTH THREE TIMES DAILY AS NEEDED 90 tablet 0  . lisinopril (PRINIVIL,ZESTRIL) 40 MG tablet TAKE ONE TABLET BY MOUTH ONCE DAILY 30 tablet 0  . verapamil (CALAN-SR) 240 MG CR tablet TAKE ONE TABLET BY MOUTH ONCE DAILY 30 tablet 5   No current facility-administered medications on file prior to visit.    No Known Allergies Social History   Social History  . Marital status: Married    Spouse name: N/A  . Number of children: N/A  . Years of education: N/A   Occupational History  . Not on file.   Social History Main Topics  . Smoking status: Current Some Day Smoker    Types: Cigars  . Smokeless tobacco: Not on file  . Alcohol use 4.2 oz/week    7 Cans of beer per week  . Drug use: No  . Sexual activity: Not on file   Other Topics Concern  . Not on file   Social History Narrative  . No narrative on file   No family history on file.   Review of Systems  All other systems reviewed and are negative.      Objective:   Physical Exam  Constitutional: He appears well-developed and well-nourished. No distress.  Cardiovascular: Normal rate, regular rhythm, normal heart sounds and intact distal pulses.  Exam reveals no gallop and no friction rub.   No murmur heard. Pulmonary/Chest: Effort normal and  breath sounds normal. No respiratory distress. He has no wheezes. He has no rales. He exhibits no tenderness.  Abdominal: Soft. Bowel sounds are normal. He exhibits no distension and no mass. There is no tenderness. There is no rebound and no guarding.  Musculoskeletal: He exhibits no edema.  Skin: He is not diaphoretic.  Vitals reviewed.         Assessment & Plan:  Benign essential HTN - Plan: hydrochlorothiazide (HYDRODIURIL) 25 MG tablet  Prediabetes - Plan: CBC with Differential/Platelet, COMPLETE METABOLIC PANEL WITH GFR, Lipid panel, Microalbumin, urine, Hemoglobin A1c  Blood pressure significantly elevated today. I will hydrochlorothiazide 25 mg daily. Given his history of gout, I advised  the patient drink plenty of water with this medication to avoid a gout exacerbation. His heart rate is too low today to add atenolol. I will check a fasting lipid panel. I'll also check a urine microalbumin. I will check a hemoglobin A1c. Goal hemoglobin A1c is less than 6.5. Also check a fasting lipid panel. Goal LDL cholesterol is less than 100

## 2016-04-25 LAB — MICROALBUMIN, URINE: Microalb, Ur: 74.9 mg/dL

## 2016-04-26 ENCOUNTER — Encounter: Payer: Self-pay | Admitting: Family Medicine

## 2016-05-16 ENCOUNTER — Other Ambulatory Visit: Payer: Self-pay | Admitting: Family Medicine

## 2016-05-30 ENCOUNTER — Other Ambulatory Visit: Payer: Self-pay | Admitting: Family Medicine

## 2016-06-06 ENCOUNTER — Other Ambulatory Visit: Payer: Self-pay | Admitting: Family Medicine

## 2016-06-19 ENCOUNTER — Other Ambulatory Visit: Payer: Self-pay | Admitting: Family Medicine

## 2016-06-21 ENCOUNTER — Other Ambulatory Visit: Payer: Self-pay | Admitting: Family Medicine

## 2016-06-21 MED ORDER — VERAPAMIL HCL ER 240 MG PO TBCR: 240.0000 mg | EXTENDED_RELEASE_TABLET | Freq: Every day | ORAL | 5 refills | Status: DC

## 2016-06-22 ENCOUNTER — Other Ambulatory Visit: Payer: Self-pay | Admitting: Family Medicine

## 2016-06-22 NOTE — Telephone Encounter (Signed)
Ok to refill 

## 2016-06-23 NOTE — Telephone Encounter (Signed)
ok 

## 2016-10-20 ENCOUNTER — Ambulatory Visit: Payer: Medicare Other | Admitting: Family Medicine

## 2016-10-26 ENCOUNTER — Ambulatory Visit (INDEPENDENT_AMBULATORY_CARE_PROVIDER_SITE_OTHER): Payer: Medicare Other | Admitting: Physician Assistant

## 2016-10-26 ENCOUNTER — Encounter: Payer: Self-pay | Admitting: Physician Assistant

## 2016-10-26 VITALS — BP 168/88 | HR 72 | Temp 98.0°F | Resp 18 | Wt 206.4 lb

## 2016-10-26 DIAGNOSIS — B9689 Other specified bacterial agents as the cause of diseases classified elsewhere: Principal | ICD-10-CM

## 2016-10-26 DIAGNOSIS — J988 Other specified respiratory disorders: Secondary | ICD-10-CM | POA: Diagnosis not present

## 2016-10-26 MED ORDER — HYDROCODONE-HOMATROPINE 5-1.5 MG/5ML PO SYRP: 5.0000 mL | ORAL_SOLUTION | Freq: Three times a day (TID) | ORAL | 0 refills | Status: DC | PRN

## 2016-10-26 MED ORDER — AZITHROMYCIN 250 MG PO TABS: ORAL_TABLET | ORAL | 0 refills | Status: DC

## 2016-10-26 NOTE — Progress Notes (Signed)
    Patient ID: Travis Singh MRN: [id number], DOB: May 14, 1946, 71 y.o. Date of Encounter: 10/26/2016, 9:53 AM    Chief Complaint:  Chief Complaint  Patient presents with  . chest congestion    x2wk     HPI: 71 y.o. year old male presents with above.   Reports that he has been having chest congestion and cough for 2 weeks. He has had no congestion in his head or nose. Blowing no mucus from the nose. Has had no fevers or chills. Says that phlegm is thick yellow-green. Having no lower extremity edema. Cough is keeping him awake at night.     Home Meds:   Outpatient Medications Prior to Visit  Medication Sig Dispense Refill  . atorvastatin (LIPITOR) 10 MG tablet TAKE ONE TABLET BY MOUTH ONCE DAILY 30 tablet 3  . atorvastatin (LIPITOR) 10 MG tablet TAKE ONE TABLET BY MOUTH ONCE DAILY 30 tablet 3  . colchicine 0.6 MG tablet Take 1 tablet (0.6 mg total) by mouth daily. 100 tablet 1  . cyclobenzaprine (FLEXERIL) 10 MG tablet TAKE ONE TABLET BY MOUTH THREE TIMES DAILY AS NEEDED 90 tablet 0  . hydrochlorothiazide (HYDRODIURIL) 25 MG tablet Take 1 tablet (25 mg total) by mouth daily. 90 tablet 3  . lisinopril (PRINIVIL,ZESTRIL) 40 MG tablet TAKE ONE TABLET BY MOUTH ONCE DAILY 30 tablet 11  . verapamil (CALAN-SR) 240 MG CR tablet Take 1 tablet (240 mg total) by mouth daily. 30 tablet 5   No facility-administered medications prior to visit.     Allergies: No Known Allergies    Review of Systems: See HPI for pertinent ROS. All other ROS negative.    Physical Exam: Blood pressure (!) 148/88, pulse 72, temperature 98 F (36.7 C), temperature source Oral, resp. rate 18, weight 206 lb 6.4 oz (93.6 kg), SpO2 98 %., Body mass index is 30.93 kg/m. General:  WNWD AAM. Appears in no acute distress. HEENT: Normocephalic, atraumatic, eyes without discharge, sclera non-icteric, nares are without discharge. Bilateral auditory canals clear, TM's are without perforation, pearly grey and  translucent with reflective cone of light bilaterally. Oral cavity moist, posterior pharynx without exudate, erythema, peritonsillar abscess.  Neck: Supple. No thyromegaly. No lymphadenopathy. Lungs: Clear bilaterally to auscultation without wheezes, rales, or rhonchi. Breathing is unlabored. Heart: Regular rhythm. No murmurs, rubs, or gallops. Msk:  Strength and tone normal for age. Extremities/Skin: Warm and dry.  No LE edema.  Neuro: Alert and oriented X 3. Moves all extremities spontaneously. Gait is normal. CNII-XII grossly in tact. Psych:  Responds to questions appropriately with a normal affect.     ASSESSMENT AND PLAN:  71 y.o. year old male with  1. Bacterial respiratory infection He is to take antibiotic as directed. Mucinex DM as expectorant. Can use Hycodan as cough suppressant present especially at night so that he can sleep. Follow up if symptoms do not resolve within 1 week after completion of antibiotic.  - azithromycin (ZITHROMAX) 250 MG tablet; Day 1: Take 2 daily. Days 2 - 5: Take 1 daily.  Dispense: 6 tablet; Refill: 0 - HYDROcodone-homatropine (HYCODAN) 5-1.5 MG/5ML syrup; Take 5 mLs by mouth every 8 (eight) hours as needed for cough.  Dispense: 120 mL; Refill: 0   Signed, [address] [place], [place], [facility] 10/26/2016 9:53 AM

## 2016-11-14 ENCOUNTER — Ambulatory Visit (INDEPENDENT_AMBULATORY_CARE_PROVIDER_SITE_OTHER): Payer: Medicare Other | Admitting: Family Medicine

## 2016-11-14 ENCOUNTER — Encounter: Payer: Self-pay | Admitting: Family Medicine

## 2016-11-14 VITALS — BP 162/100 | HR 74 | Temp 98.0°F | Resp 16 | Ht 68.5 in | Wt 204.0 lb

## 2016-11-14 DIAGNOSIS — Z125 Encounter for screening for malignant neoplasm of prostate: Secondary | ICD-10-CM

## 2016-11-14 DIAGNOSIS — E7800 Pure hypercholesterolemia, unspecified: Secondary | ICD-10-CM | POA: Diagnosis not present

## 2016-11-14 DIAGNOSIS — Z0000 Encounter for general adult medical examination without abnormal findings: Secondary | ICD-10-CM

## 2016-11-14 DIAGNOSIS — E119 Type 2 diabetes mellitus without complications: Secondary | ICD-10-CM

## 2016-11-14 DIAGNOSIS — I10 Essential (primary) hypertension: Secondary | ICD-10-CM

## 2016-11-14 LAB — CBC WITH DIFFERENTIAL/PLATELET
Basophils Absolute: 0 cells/uL (ref 0–200)
Basophils Relative: 0 %
Eosinophils Absolute: 90 cells/uL (ref 15–500)
Eosinophils Relative: 2 %
HCT: 42.2 % (ref 38.5–50.0)
Hemoglobin: 13.7 g/dL (ref 13.0–17.0)
Lymphocytes Relative: 30 %
Lymphs Abs: 1350 cells/uL (ref 850–3900)
MCH: 27.5 pg (ref 27.0–33.0)
MCHC: 32.5 g/dL (ref 32.0–36.0)
MCV: 84.6 fL (ref 80.0–100.0)
MPV: 10.2 fL (ref 7.5–12.5)
Monocytes Absolute: 495 cells/uL (ref 200–950)
Monocytes Relative: 11 %
Neutro Abs: 2565 cells/uL (ref 1500–7800)
Neutrophils Relative %: 57 %
Platelets: 205 10*3/uL (ref 140–400)
RBC: 4.99 MIL/uL (ref 4.20–5.80)
RDW: 14.8 % (ref 11.0–15.0)
WBC: 4.5 10*3/uL (ref 3.8–10.8)

## 2016-11-14 LAB — LIPID PANEL
Cholesterol: 184 mg/dL (ref <200)
HDL: 49 mg/dL (ref >40)
LDL Cholesterol: 108 mg/dL — ABNORMAL HIGH (ref <100)
Total CHOL/HDL Ratio: 3.8 Ratio (ref <5.0)
Triglycerides: 137 mg/dL (ref <150)
VLDL: 27 mg/dL (ref <30)

## 2016-11-14 LAB — PSA: PSA: 4.8 ng/mL — ABNORMAL HIGH (ref <=4.0)

## 2016-11-14 LAB — COMPLETE METABOLIC PANEL WITH GFR
ALT: 23 U/L (ref 9–46)
AST: 21 U/L (ref 10–35)
Albumin: 3.8 g/dL (ref 3.6–5.1)
Alkaline Phosphatase: 68 U/L (ref 40–115)
BUN: 15 mg/dL (ref 7–25)
CO2: 27 mmol/L (ref 20–31)
Calcium: 9 mg/dL (ref 8.6–10.3)
Chloride: 107 mmol/L (ref 98–110)
Creat: 1.07 mg/dL (ref 0.70–1.18)
GFR, Est African American: 80 mL/min (ref >=60)
GFR, Est Non African American: 69 mL/min (ref >=60)
Glucose, Bld: 107 mg/dL — ABNORMAL HIGH (ref 70–99)
Potassium: 4.6 mmol/L (ref 3.5–5.3)
Sodium: 142 mmol/L (ref 135–146)
Total Bilirubin: 0.7 mg/dL (ref 0.2–1.2)
Total Protein: 6.6 g/dL (ref 6.1–8.1)

## 2016-11-14 MED ORDER — HYDROCHLOROTHIAZIDE 25 MG PO TABS: 25.0000 mg | ORAL_TABLET | Freq: Every day | ORAL | 3 refills | Status: DC

## 2016-11-14 MED ORDER — ATORVASTATIN CALCIUM 10 MG PO TABS
10.0000 mg | ORAL_TABLET | Freq: Every day | ORAL | 5 refills | Status: DC
Start: 2016-11-14 — End: 2017-05-08

## 2016-11-14 MED ORDER — LISINOPRIL 40 MG PO TABS: 40.0000 mg | ORAL_TABLET | Freq: Every day | ORAL | 11 refills | Status: DC

## 2016-11-14 MED ORDER — VERAPAMIL HCL ER 240 MG PO TBCR
240.0000 mg | EXTENDED_RELEASE_TABLET | Freq: Every day | ORAL | 5 refills | Status: DC
Start: 2016-11-14 — End: 2017-05-08

## 2016-11-14 NOTE — Progress Notes (Signed)
Subjective:    Patient ID: Travis Singh, male    DOB: 07-Jul-1946, 71 y.o.   MRN: [id number]  HPI Patient is here today for his complete physical exam. His blood pressure today is extremely high. He admits that he's been eating a lot of salt recently. He also stopped taking his hydrochlorothiazide. He has not had a gout flare in over a year. Past medical history is significant for diabetes mellitus borderline. He is overdue for hemoglobin A1c as well as microalbumin. He is also due to recheck his fasting lipid panel for which he takes Lipitor for hyperlipidemia. Regarding his immunizations, Pneumovax 23 and Prevnar 13 are up-to-date. The patient is due for a flu shot, tetanus shot, and the shingles vaccine. His last colonoscopy was in 2009 and is due again next year Past Medical History:  Diagnosis Date  . Gout   . High cholesterol   . Hypertension   . 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 Current Outpatient Prescriptions on File Prior to Visit  Medication Sig Dispense Refill  . colchicine 0.6 MG tablet Take 1 tablet (0.6 mg total) by mouth daily. 100 tablet 1  . cyclobenzaprine (FLEXERIL) 10 MG tablet TAKE ONE TABLET BY MOUTH THREE TIMES DAILY AS NEEDED 90 tablet 0   No current facility-administered medications on file prior to visit.    No Known Allergies Social History   Social History  . Marital status: Married    Spouse name: N/A  . Number of children: N/A  . Years of education: N/A   Occupational History  . Not on file.   Social History Main Topics  . Smoking status: Former Smoker    Types: Cigars    Quit date: 09/25/2016  . Smokeless tobacco: Former Systems developer  . Alcohol use 4.2 oz/week    7 Cans of beer per week  . Drug use: No  . Sexual activity: Not on file   Other Topics Concern  . Not on file   Social History Narrative  . No narrative on file   No family history on file. Denies prostate cancer or colon  cancer in the family history   Review of Systems  All other systems reviewed and are negative.      Objective:   Physical Exam  Constitutional: He is oriented to person, place, and time. He appears well-developed and well-nourished. No distress.  HENT:  Head: Normocephalic and atraumatic.  Right Ear: External ear normal.  Left Ear: External ear normal.  Nose: Nose normal.  Mouth/Throat: Oropharynx is clear and moist. No oropharyngeal exudate.  Eyes: Conjunctivae and EOM are normal. Pupils are equal, round, and reactive to light. Right eye exhibits no discharge. Left eye exhibits no discharge. No scleral icterus.  Neck: Normal range of motion. Neck supple. No JVD present. No tracheal deviation present. No thyromegaly present.  Cardiovascular: Normal rate, regular rhythm, normal heart sounds and intact distal pulses.  Exam reveals no gallop and no friction rub.   No murmur heard. Pulmonary/Chest: Effort normal and breath sounds normal. No stridor. No respiratory distress. He has no wheezes. He has no rales. He exhibits no tenderness.  Abdominal: Soft. Bowel sounds are normal. He exhibits no distension and no mass. There is no tenderness. There is no rebound and no guarding.  Musculoskeletal: Normal range of motion. He exhibits no edema, tenderness or deformity.  Lymphadenopathy:    He has no cervical adenopathy.  Neurological: He is alert and oriented to person, place, and time. He has normal reflexes. He displays normal reflexes. No cranial nerve deficit. He exhibits normal muscle tone. Coordination normal.  Skin: Skin is warm. No rash noted. He is not diaphoretic. No erythema. No pallor.  Psychiatric: He has a normal mood and affect. His behavior is normal. Judgment and thought content normal.  Vitals reviewed.         Assessment & Plan:  Prostate cancer screening - Plan: PSA  Benign essential HTN - Plan: hydrochlorothiazide (HYDRODIURIL) 25 MG tablet, CBC with  Differential/Platelet, Lipid panel, COMPLETE METABOLIC PANEL WITH GFR  Controlled type 2 diabetes mellitus without complication, without long-term current use of insulin (HCC) - Plan: Hemoglobin A1c, Microalbumin, urine  Gen Med exam  Hld  Blood pressure today is elevated. Continue verapamil, lisinopril, and resume hydrochlorothiazide 25 mg a day and recheck blood pressure in 3 weeks. Check hemoglobin A1c. The patient has borderline type 2 diabetes mellitus. At the present time his diet controlled. He is not checking his sugars but he denies polyuria polydipsia or blurry vision. Check a fasting lipid panel. Goal LDL cholesterol is less than 100. I will also screen the patient for prostate cancer with PSA. Colonoscopy is up-to-date. I recommended a flu shot, tetanus shot, and the shingles shot. He declines all these vaccines at the present time. I did recommend an annual eye exam

## 2016-11-15 LAB — HEMOGLOBIN A1C
Hgb A1c MFr Bld: 6.4 % — ABNORMAL HIGH (ref <5.7)
Mean Plasma Glucose: 137 mg/dL

## 2016-11-15 LAB — MICROALBUMIN, URINE: Microalb, Ur: 118 mg/dL

## 2016-11-20 ENCOUNTER — Other Ambulatory Visit: Payer: Self-pay | Admitting: Family Medicine

## 2016-11-20 DIAGNOSIS — R972 Elevated prostate specific antigen [PSA]: Secondary | ICD-10-CM

## 2016-12-26 DIAGNOSIS — K648 Other hemorrhoids: Secondary | ICD-10-CM | POA: Diagnosis not present

## 2016-12-26 DIAGNOSIS — Z1211 Encounter for screening for malignant neoplasm of colon: Secondary | ICD-10-CM | POA: Diagnosis not present

## 2017-01-01 ENCOUNTER — Encounter: Payer: Self-pay | Admitting: Family Medicine

## 2017-01-01 ENCOUNTER — Ambulatory Visit (INDEPENDENT_AMBULATORY_CARE_PROVIDER_SITE_OTHER): Payer: Medicare Other | Admitting: Family Medicine

## 2017-01-01 VITALS — BP 136/88 | HR 80 | Temp 98.0°F | Resp 18 | Wt 209.0 lb

## 2017-01-01 DIAGNOSIS — M5432 Sciatica, left side: Secondary | ICD-10-CM | POA: Diagnosis not present

## 2017-01-01 MED ORDER — PREDNISONE 20 MG PO TABS: ORAL_TABLET | ORAL | 0 refills | Status: DC

## 2017-01-01 NOTE — Progress Notes (Signed)
Subjective:    Patient ID: Travis Singh, male    DOB: 04-13-1946, 71 y.o.   MRN: [id number]  HPI Patient had right-sided radicular nerve pain in his right leg in 2015. At that time we repeated an MRI which revealed bulging disc between L3-L4, L4-L5, and L5-S1 most severe at L4-L5 and L5-S1. Patient had bilateral foraminal stenosis at all 3 levels and the S1 nerve roots were actually being abutted by the bulging disc at that time. Symptoms resolved gradually. Beginning a week ago, his symptoms returned. Instead now he is having severe pain started in his lower back and radiated down his left leg all the way into his left foot whenever he stands or walks for a long period of time  Past Medical History:  Diagnosis Date  . Diabetes mellitus without complication (HCC)    prediabetes  . Gout   . High cholesterol   . Hypertension   . 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 Current Outpatient Prescriptions on File Prior to Visit  Medication Sig Dispense Refill  . atorvastatin (LIPITOR) 10 MG tablet Take 1 tablet (10 mg total) by mouth daily. 30 tablet 5  . colchicine 0.6 MG tablet Take 1 tablet (0.6 mg total) by mouth daily. 100 tablet 1  . cyclobenzaprine (FLEXERIL) 10 MG tablet TAKE ONE TABLET BY MOUTH THREE TIMES DAILY AS NEEDED 90 tablet 0  . hydrochlorothiazide (HYDRODIURIL) 25 MG tablet Take 1 tablet (25 mg total) by mouth daily. 90 tablet 3  . lisinopril (PRINIVIL,ZESTRIL) 40 MG tablet Take 1 tablet (40 mg total) by mouth daily. 30 tablet 11  . verapamil (CALAN-SR) 240 MG CR tablet Take 1 tablet (240 mg total) by mouth daily. 30 tablet 5   No current facility-administered medications on file prior to visit.    No Known Allergies Social History   Social History  . Marital status: Married    Spouse name: N/A  . Number of children: N/A  . Years of education: N/A   Occupational History  . Not on file.   Social History  Main Topics  . Smoking status: Former Smoker    Types: Cigars    Quit date: 09/25/2016  . Smokeless tobacco: Former Systems developer  . Alcohol use 4.2 oz/week    7 Cans of beer per week  . Drug use: No  . Sexual activity: Not on file   Other Topics Concern  . Not on file   Social History Narrative  . No narrative on file    Review of Systems  All other systems reviewed and are negative.      Objective:   Physical Exam  Constitutional: He is oriented to person, place, and time.  Cardiovascular: Normal rate, regular rhythm and normal heart sounds.   Pulmonary/Chest: Effort normal and breath sounds normal.  Abdominal: Soft. Bowel sounds are normal.  Musculoskeletal:       Lumbar back: He exhibits decreased range of motion, tenderness and pain.  Neurological: He is alert and oriented to person, place, and time. He has normal reflexes. He displays normal reflexes. No cranial nerve deficit. He exhibits normal muscle tone. Coordination normal.  Vitals reviewed.         Assessment & Plan:  Left sciatic nerve pain - Plan: DG Lumbar Spine Complete, predniSONE (DELTASONE) 20 MG tablet  I would like the patient to get an x-ray of his lumbar spine to rule out any  changes in the interval time since his MRI from 2015. I believe the patient has left-sided lumbar radiculopathy likely due to bulging disc between L5-S1. Begin prednisone taper pack and recheck in 2 weeks or sooner if worse

## 2017-01-02 ENCOUNTER — Ambulatory Visit
Admission: RE | Admit: 2017-01-02 | Discharge: 2017-01-02 | Disposition: A | Discharge status: Home / Self Care | Payer: Medicare Other | Source: Ambulatory Visit | Attending: Family Medicine | Admitting: Family Medicine

## 2017-01-02 DIAGNOSIS — M5137 Other intervertebral disc degeneration, lumbosacral region: Secondary | ICD-10-CM | POA: Diagnosis not present

## 2017-01-02 DIAGNOSIS — M5432 Sciatica, left side: Secondary | ICD-10-CM

## 2017-01-08 DIAGNOSIS — R972 Elevated prostate specific antigen [PSA]: Secondary | ICD-10-CM | POA: Diagnosis not present

## 2017-01-19 ENCOUNTER — Encounter: Payer: Self-pay | Admitting: Family Medicine

## 2017-01-28 ENCOUNTER — Other Ambulatory Visit: Payer: Self-pay | Admitting: Family Medicine

## 2017-01-29 NOTE — Telephone Encounter (Signed)
Ok to refill 

## 2017-01-29 NOTE — Telephone Encounter (Signed)
ok 

## 2017-01-31 DIAGNOSIS — C61 Malignant neoplasm of prostate: Secondary | ICD-10-CM | POA: Diagnosis not present

## 2017-01-31 DIAGNOSIS — D075 Carcinoma in situ of prostate: Secondary | ICD-10-CM | POA: Diagnosis not present

## 2017-01-31 DIAGNOSIS — R972 Elevated prostate specific antigen [PSA]: Secondary | ICD-10-CM | POA: Diagnosis not present

## 2017-02-16 ENCOUNTER — Encounter: Payer: Self-pay | Admitting: Family Medicine

## 2017-03-05 DIAGNOSIS — D120 Benign neoplasm of cecum: Secondary | ICD-10-CM | POA: Diagnosis not present

## 2017-03-05 DIAGNOSIS — K635 Polyp of colon: Secondary | ICD-10-CM | POA: Diagnosis not present

## 2017-03-05 DIAGNOSIS — Z1211 Encounter for screening for malignant neoplasm of colon: Secondary | ICD-10-CM | POA: Diagnosis not present

## 2017-03-07 DIAGNOSIS — C61 Malignant neoplasm of prostate: Secondary | ICD-10-CM | POA: Diagnosis not present

## 2017-03-16 ENCOUNTER — Encounter: Payer: Self-pay | Admitting: Family Medicine

## 2017-04-27 ENCOUNTER — Other Ambulatory Visit: Payer: Self-pay | Admitting: Family Medicine

## 2017-04-27 DIAGNOSIS — I10 Essential (primary) hypertension: Secondary | ICD-10-CM

## 2017-05-08 ENCOUNTER — Other Ambulatory Visit: Payer: Self-pay | Admitting: Family Medicine

## 2017-05-08 ENCOUNTER — Telehealth: Payer: Self-pay | Admitting: Family Medicine

## 2017-05-08 MED ORDER — ATORVASTATIN CALCIUM 10 MG PO TABS: 10.0000 mg | ORAL_TABLET | Freq: Every day | ORAL | 0 refills | Status: DC

## 2017-05-08 MED ORDER — VERAPAMIL HCL ER 240 MG PO TBCR: 240.0000 mg | EXTENDED_RELEASE_TABLET | Freq: Every day | ORAL | 0 refills | Status: DC

## 2017-05-08 NOTE — Telephone Encounter (Signed)
Medication refilled per protocol. 

## 2017-06-20 DIAGNOSIS — C61 Malignant neoplasm of prostate: Secondary | ICD-10-CM | POA: Diagnosis not present

## 2017-06-27 DIAGNOSIS — R972 Elevated prostate specific antigen [PSA]: Secondary | ICD-10-CM | POA: Diagnosis not present

## 2017-06-27 DIAGNOSIS — C61 Malignant neoplasm of prostate: Secondary | ICD-10-CM | POA: Diagnosis not present

## 2017-07-04 ENCOUNTER — Ambulatory Visit (INDEPENDENT_AMBULATORY_CARE_PROVIDER_SITE_OTHER): Payer: Medicare Other

## 2017-07-04 DIAGNOSIS — Z23 Encounter for immunization: Secondary | ICD-10-CM | POA: Diagnosis not present

## 2017-07-04 NOTE — Progress Notes (Signed)
Patient was seen in office to received flu vaccine.Patient received vaccine in left deltoid.Patient tolerated well

## 2017-07-07 ENCOUNTER — Other Ambulatory Visit: Payer: Self-pay | Admitting: Family Medicine

## 2017-08-09 ENCOUNTER — Ambulatory Visit (INDEPENDENT_AMBULATORY_CARE_PROVIDER_SITE_OTHER): Payer: Medicare Other | Admitting: Family Medicine

## 2017-08-09 ENCOUNTER — Encounter: Payer: Self-pay | Admitting: Family Medicine

## 2017-08-09 VITALS — BP 117/80 | HR 60 | Temp 98.0°F | Resp 18 | Ht 68.5 in | Wt 214.0 lb

## 2017-08-09 DIAGNOSIS — M25562 Pain in left knee: Secondary | ICD-10-CM

## 2017-08-09 NOTE — Progress Notes (Signed)
Subjective:    Patient ID: Travis Singh, male    DOB: Dec 11, 1945, 71 y.o.   MRN: [id number]  HPI 3 weeks ago, the patient twisted his left knee in a hole while walking.  He now has pain over the medial and lateral joint lines bilaterally.  He reports occasional effusion.  He reports pain with standing and walking.  He denies any laxity to varus or valgus stress.  He denies any knee instability. Past Medical History:  Diagnosis Date  . Diabetes mellitus without complication (HCC)    prediabetes  . Gout   . High cholesterol   . Hypertension   . 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 Tubular adenoma    2018   No past surgical history on file. Current Outpatient Medications on File Prior to Visit  Medication Sig Dispense Refill  . atorvastatin (LIPITOR) 10 MG tablet Take 1 tablet (10 mg total) by mouth daily. 30 tablet 0  . colchicine 0.6 MG tablet Take 1 tablet (0.6 mg total) by mouth daily. 100 tablet 1  . cyclobenzaprine (FLEXERIL) 10 MG tablet TAKE ONE TABLET BY MOUTH THREE TIMES DAILY AS NEEDED 90 tablet 0  . hydrochlorothiazide (HYDRODIURIL) 25 MG tablet TAKE 1 TABLET BY MOUTH ONCE DAILY 90 tablet 3  . lisinopril (PRINIVIL,ZESTRIL) 40 MG tablet Take 1 tablet (40 mg total) by mouth daily. 30 tablet 11  . verapamil (CALAN-SR) 240 MG CR tablet Take 1 tablet (240 mg total) by mouth daily. 30 tablet 0   No current facility-administered medications on file prior to visit.    No Known Allergies Social History   Socioeconomic History  . Marital status: Married    Spouse name: Not on file  . Number of children: Not on file  . Years of education: Not on file  . Highest education level: Not on file  Social Needs  . Financial resource strain: Not on file  . Food insecurity - worry: Not on file  . Food insecurity - inability: Not on file  . Transportation needs - medical: Not on file  . Transportation needs - non-medical: Not on file  Occupational History   . Not on file  Tobacco Use  . Smoking status: Former Smoker    Types: Cigars    Last attempt to quit: 09/25/2016    Years since quitting: 0.8  . Smokeless tobacco: Former Network engineer and Sexual Activity  . Alcohol use: Yes    Alcohol/week: 4.2 oz    Types: 7 Cans of beer per week  . Drug use: No  . Sexual activity: Not on file  Other Topics Concern  . Not on file  Social History Narrative  . Not on file    Review of Systems  All other systems reviewed and are negative.      Objective:   Physical Exam  Constitutional: He is oriented to person, place, and time.  Cardiovascular: Normal rate, regular rhythm and normal heart sounds.  Pulmonary/Chest: Effort normal and breath sounds normal.  Abdominal: Soft. Bowel sounds are normal.  Musculoskeletal:       Left knee: He exhibits decreased range of motion and swelling. He exhibits no LCL laxity, normal meniscus and no MCL laxity. Tenderness found. Medial joint line and lateral joint line tenderness noted.  Neurological: He is alert and oriented to person, place, and time. He has normal reflexes. No cranial nerve deficit. He exhibits normal muscle tone. Coordination normal.  Vitals  reviewed.         Assessment & Plan:  Acute pain of left knee Believe the patient has sprained his knee.  He is failing conservative therapy with NSAIDs and time.  Using sterile technique, I injected the left knee with 2 cc of lidocaine, 2 cc of Marcaine, and 2 cc of 40 mg/mL Kenalog.  Patient tolerated the procedure well without complication

## 2017-09-26 DIAGNOSIS — H25099 Other age-related incipient cataract, unspecified eye: Secondary | ICD-10-CM | POA: Diagnosis not present

## 2017-09-26 DIAGNOSIS — H5203 Hypermetropia, bilateral: Secondary | ICD-10-CM | POA: Diagnosis not present

## 2017-09-26 DIAGNOSIS — H18419 Arcus senilis, unspecified eye: Secondary | ICD-10-CM | POA: Diagnosis not present

## 2017-09-26 DIAGNOSIS — H11159 Pinguecula, unspecified eye: Secondary | ICD-10-CM | POA: Diagnosis not present

## 2017-09-26 DIAGNOSIS — H40013 Open angle with borderline findings, low risk, bilateral: Secondary | ICD-10-CM | POA: Diagnosis not present

## 2017-09-26 DIAGNOSIS — H52222 Regular astigmatism, left eye: Secondary | ICD-10-CM | POA: Diagnosis not present

## 2017-09-26 DIAGNOSIS — H524 Presbyopia: Secondary | ICD-10-CM | POA: Diagnosis not present

## 2017-11-13 ENCOUNTER — Other Ambulatory Visit: Payer: Self-pay | Admitting: Family Medicine

## 2017-11-13 NOTE — Telephone Encounter (Signed)
Per Dr. Dennard Schaumann ok to refill - med sent to [facility]

## 2017-12-03 ENCOUNTER — Other Ambulatory Visit: Payer: Self-pay | Admitting: Family Medicine

## 2017-12-03 DIAGNOSIS — I10 Essential (primary) hypertension: Secondary | ICD-10-CM

## 2017-12-17 DIAGNOSIS — C61 Malignant neoplasm of prostate: Secondary | ICD-10-CM | POA: Diagnosis not present

## 2017-12-26 ENCOUNTER — Other Ambulatory Visit: Payer: Self-pay | Admitting: Urology

## 2017-12-26 DIAGNOSIS — C61 Malignant neoplasm of prostate: Secondary | ICD-10-CM

## 2017-12-26 DIAGNOSIS — R5383 Other fatigue: Secondary | ICD-10-CM | POA: Diagnosis not present

## 2017-12-26 DIAGNOSIS — R972 Elevated prostate specific antigen [PSA]: Secondary | ICD-10-CM | POA: Diagnosis not present

## 2018-01-08 ENCOUNTER — Ambulatory Visit
Admission: RE | Admit: 2018-01-08 | Discharge: 2018-01-08 | Disposition: A | Discharge status: Home / Self Care | Payer: Medicare Other | Source: Ambulatory Visit | Attending: Urology | Admitting: Urology

## 2018-01-08 DIAGNOSIS — C61 Malignant neoplasm of prostate: Secondary | ICD-10-CM | POA: Diagnosis not present

## 2018-01-08 MED ORDER — GADOBENATE DIMEGLUMINE 529 MG/ML IV SOLN
19.0000 mL | Freq: Once | INTRAVENOUS | Status: AC | PRN
  Administered 2018-01-08: 19 mL via INTRAVENOUS

## 2018-01-20 ENCOUNTER — Other Ambulatory Visit: Payer: Self-pay | Admitting: Family Medicine

## 2018-01-27 ENCOUNTER — Other Ambulatory Visit: Payer: Self-pay | Admitting: Family Medicine

## 2018-02-06 DIAGNOSIS — R972 Elevated prostate specific antigen [PSA]: Secondary | ICD-10-CM | POA: Diagnosis not present

## 2018-02-06 DIAGNOSIS — C61 Malignant neoplasm of prostate: Secondary | ICD-10-CM | POA: Diagnosis not present

## 2018-02-06 DIAGNOSIS — D075 Carcinoma in situ of prostate: Secondary | ICD-10-CM | POA: Diagnosis not present

## 2018-02-18 ENCOUNTER — Encounter: Payer: Self-pay | Admitting: Family Medicine

## 2018-02-18 DIAGNOSIS — C61 Malignant neoplasm of prostate: Secondary | ICD-10-CM | POA: Insufficient documentation

## 2018-02-21 DIAGNOSIS — N5201 Erectile dysfunction due to arterial insufficiency: Secondary | ICD-10-CM | POA: Diagnosis not present

## 2018-02-21 DIAGNOSIS — R972 Elevated prostate specific antigen [PSA]: Secondary | ICD-10-CM | POA: Diagnosis not present

## 2018-02-21 DIAGNOSIS — C61 Malignant neoplasm of prostate: Secondary | ICD-10-CM | POA: Diagnosis not present

## 2018-03-04 ENCOUNTER — Encounter: Payer: Self-pay | Admitting: Radiation Oncology

## 2018-03-12 ENCOUNTER — Encounter: Payer: Self-pay | Admitting: Radiation Oncology

## 2018-03-12 NOTE — Progress Notes (Addendum)
GU Location of Tumor / Histology: prostatic adenocarcinoma  If Prostate Cancer, Gleason Score is (3 + 4) and PSA is (7.08) on 12/17/2017. Prostate volume: 47 grams.  Travis Singh was diagnosed with prostate cancer on 01/31/2017. He opted for active surveillance. Surveillance biopsy done 02/06/2018 revealed disease progression.   Biopsies of prostate (if applicable) revealed:    Past/Anticipated interventions by urology, if any: prostate biopsy, surveillance, MRI prostate, repeat biopsy, discuss of treatment options, referral to radiation oncology for consideration of seeds  Past/Anticipated interventions by medical oncology, if any: no  Weight changes, if any: no  Bowel/Bladder complaints, if any: Confidence he can get an erection is low. Reports urinary frequency, urgency and nocturia x 3.  Denies dysuria or hematuria. Reports occasional urinary leakage associated with urgency.  Nausea/Vomiting, if any: no  Pain issues, if any:  no  SAFETY ISSUES:  Prior radiation? no  Pacemaker/ICD? no  Possible current pregnancy? no  Is the patient on methotrexate? no  Current Complaints / other details:  72 year old male. 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 Works in Architect. Father with hx of leukemia. Mother with hx of unknown ca. Resides in [place]

## 2018-03-13 ENCOUNTER — Other Ambulatory Visit: Payer: Self-pay

## 2018-03-13 ENCOUNTER — Ambulatory Visit
Admission: RE | Admit: 2018-03-13 | Discharge: 2018-03-13 | Disposition: A | Discharge status: Home / Self Care | Payer: Medicare Other | Source: Ambulatory Visit | Attending: Radiation Oncology | Admitting: Radiation Oncology

## 2018-03-13 ENCOUNTER — Encounter: Payer: Self-pay | Admitting: Radiation Oncology

## 2018-03-13 VITALS — BP 146/77 | HR 60 | Temp 97.9°F | Resp 18 | Ht 68.5 in | Wt 207.6 lb

## 2018-03-13 DIAGNOSIS — E7800 Pure hypercholesterolemia, unspecified: Secondary | ICD-10-CM | POA: Insufficient documentation

## 2018-03-13 DIAGNOSIS — E119 Type 2 diabetes mellitus without complications: Secondary | ICD-10-CM | POA: Diagnosis not present

## 2018-03-13 DIAGNOSIS — Z87891 Personal history of nicotine dependence: Secondary | ICD-10-CM | POA: Insufficient documentation

## 2018-03-13 DIAGNOSIS — Z808 Family history of malignant neoplasm of other organs or systems: Secondary | ICD-10-CM | POA: Insufficient documentation

## 2018-03-13 DIAGNOSIS — Z79899 Other long term (current) drug therapy: Secondary | ICD-10-CM | POA: Insufficient documentation

## 2018-03-13 DIAGNOSIS — Z801 Family history of malignant neoplasm of trachea, bronchus and lung: Secondary | ICD-10-CM | POA: Diagnosis not present

## 2018-03-13 DIAGNOSIS — C61 Malignant neoplasm of prostate: Secondary | ICD-10-CM | POA: Diagnosis not present

## 2018-03-13 DIAGNOSIS — M109 Gout, unspecified: Secondary | ICD-10-CM | POA: Insufficient documentation

## 2018-03-13 DIAGNOSIS — R972 Elevated prostate specific antigen [PSA]: Secondary | ICD-10-CM | POA: Diagnosis not present

## 2018-03-13 DIAGNOSIS — I10 Essential (primary) hypertension: Secondary | ICD-10-CM | POA: Diagnosis not present

## 2018-03-13 NOTE — Progress Notes (Signed)
Radiation Oncology         [phone] ________________________________  Initial Outpatient Consultation  Name: Travis Singh MRN: [id number]  Date: 03/13/2018  DOB: [birth year]  OM:[id number], Cammie Mcgee, MD  Irine Seal, MD   REFERRING PHYSICIAN: Irine Seal, MD  DIAGNOSIS: 72 y.o. gentleman with Stage T1c adenocarcinoma of the prostate with Gleason Score of 3+4, and PSA of 7.08.    ICD-10-CM   1. Malignant neoplasm of prostate ([place]) [facility] referral to Social Work  2. Prostate cancer [facility]) C61     HISTORY OF PRESENT ILLNESS: Travis Singh is a 71 y.o. male with a diagnosis of prostate cancer. He was noted to have an elevated PSA of 4.59 by his primary care physician, Dr. Dennard Schaumann in 2018.  Accordingly, he was referred for evaluation in urology by Dr. Jeffie Pollock on 01/31/17,  digital rectal examination was performed at that time revealing no nodularity. The patient's pathology report revealed Gleason 3+3 T1c prostate cancer in the right lateral apex and left mid lateral core. The patient elected to undergo active surveillance at that time.   The patient has remained in close observation with Dr. Jeffie Pollock since that time.  On follow up office visit on 12/26/17, his PSA was 7.08. [name] at this time showed no nodularity.  He had an MRI of the prostate on 01/08/2018 for further evaluation and this revealed an 11 mm nodule at the left lateral mid to apex of the peripheral zone with restricted diffusion, raising suspicion for high-grade tumor, PI-RADS 4.  Additionally, there was extracapsular bulge worrisome for early extracapsular extension but no evidence of seminal vesicle involvement or metastatic disease.  The patient proceeded to MRI fusion transrectal ultrasound with 12 biopsies of the prostate on 02/06/18.  The prostate volume measured 47 cc.  Out of 12 core biopsies, four were positive.  The maximum Gleason score was 3+4, and this was seen in the right mid and right mid lateral. Additionaly,  Gleason 3+3 disease was noted in the left apex lateral and right apex lateral.  The patient reviewed the biopsy results with his urologist and he has kindly been referred today for discussion of potential radiation treatment options.   PREVIOUS RADIATION THERAPY: No  PAST MEDICAL HISTORY:  Past Medical History:  Diagnosis Date  . Diabetes mellitus without complication (HCC)    prediabetes  . Gout   . High cholesterol   . Hypertension   . Prostate cancer ([place])    Dr. Jeffie Pollock.    . 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 Tubular adenoma    2018      PAST SURGICAL HISTORY: Past Surgical History:  Procedure Laterality Date  . PROSTATE BIOPSY    . PROSTATE BIOPSY      FAMILY HISTORY:  Family History  Problem Relation Age of Onset  . Pancreatic cancer Mother   . Leukemia Father   . Lung cancer Brother   . Lung cancer Brother   . Lung cancer Brother     SOCIAL HISTORY:  Social History   Socioeconomic History  . Marital status: Married    Spouse name: Not on file  . Number of children: Not on file  . Years of education: Not on file  . Highest education level: Not on file  Occupational History  . Occupation: Nature conservation officer  Social Needs  . Financial resource strain: Not on file  . Food insecurity:    Worry: Not on file  Inability: Not on file  . Transportation needs:    Medical: Not on file    Non-medical: Not on file  Tobacco Use  . Smoking status: Former Smoker    Types: Cigars    Last attempt to quit: 09/25/2016    Years since quitting: 1.4  . Smokeless tobacco: Former Network engineer and Sexual Activity  . Alcohol use: Yes    Alcohol/week: 4.2 oz    Types: 7 Cans of beer per week  . Drug use: No  . Sexual activity: Not Currently    Comment: erectile dysfunction  Lifestyle  . Physical activity:    Days per week: Not on file    Minutes per session: Not on file  . Stress: Not on file  Relationships  . Social connections:      Talks on phone: Not on file    Gets together: Not on file    Attends religious service: Not on file    Active member of club or organization: Not on file    Attends meetings of clubs or organizations: Not on file    Relationship status: Not on file  . Intimate partner violence:    Fear of current or ex partner: Not on file    Emotionally abused: Not on file    Physically abused: Not on file    Forced sexual activity: Not on file  Other Topics Concern  . 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     ALLERGIES: Patient has no known allergies.  MEDICATIONS:  Current Outpatient Medications  Medication Sig Dispense Refill  . atorvastatin (LIPITOR) 10 MG tablet Take 1 tablet (10 mg total) by mouth daily. 30 tablet 0  . cyclobenzaprine (FLEXERIL) 10 MG tablet TAKE 1 TABLET BY MOUTH THREE TIMES DAILY AS NEEDED 90 tablet 0  . hydrochlorothiazide (HYDRODIURIL) 25 MG tablet TAKE 1 TABLET BY MOUTH ONCE DAILY 90 tablet 3  . lisinopril (PRINIVIL,ZESTRIL) 40 MG tablet TAKE 1 TABLET BY MOUTH ONCE DAILY 90 tablet 3  . verapamil (CALAN-SR) 240 MG CR tablet Take 1 tablet (240 mg total) by mouth daily. 30 tablet 0  . colchicine 0.6 MG tablet Take 1 tablet (0.6 mg total) by mouth daily. (Patient not taking: Reported on 03/13/2018) 100 tablet 1   No current facility-administered medications for this encounter.     REVIEW OF SYSTEMS:  On review of systems, the patient reports that he is doing well overall. He denies any chest pain, shortness of breath, cough, fevers, chills, night sweats, unintended weight changes. He denies any bowel disturbances, and denies abdominal pain, nausea or vomiting. He denies any new musculoskeletal or joint aches or pains. His IPSS was 6, indicating mild urinary symptoms. He reports urinary frequency, urgency, and nocturia x 3. He notes occasional urinary leakage associated with urgency. He is able to complete  sexual activity with most attempts. A complete review of systems is obtained and is otherwise negative.    PHYSICAL EXAM:  Wt Readings from Last 3 Encounters:  03/13/18 207 lb 9.6 oz (94.2 kg)  08/09/17 214 lb (97.1 kg)  01/01/17 209 lb (94.8 kg)   Temp Readings from Last 3 Encounters:  03/13/18 97.9 F (36.6 C) (Oral)  08/09/17 98 F (36.7 C) (Oral)  01/01/17 98 F (36.7 C) (Oral)   BP Readings from Last 3 Encounters:  03/13/18 (!) 146/77  08/09/17 117/80  01/01/17 136/88   Pulse Readings from  Last 3 Encounters:  03/13/18 60  08/09/17 60  01/01/17 80   Pain Assessment Pain Score: 0-No pain/10  In general this is a well appearing african [place] male in no acute distress. He is alert and oriented x4 and appropriate throughout the examination. HEENT reveals that the patient is normocephalic, atraumatic. EOMs are intact. PERRLA. Skin is intact without any evidence of gross lesions. Cardiovascular exam reveals a regular rate and rhythm, no clicks rubs or murmurs are auscultated. Chest is clear to auscultation bilaterally. Lymphatic assessment is performed and does not reveal any adenopathy in the cervical, supraclavicular, axillary, or inguinal chains. Abdomen has active bowel sounds in all quadrants and is intact. The abdomen is soft, non tender, non distended. Lower extremities are negative for pretibial pitting edema, deep calf tenderness, cyanosis or clubbing.   KPS = 90  100 - Normal; no complaints; no evidence of disease. 90   - Able to carry on normal activity; minor signs or symptoms of disease. 80   - Normal activity with effort; some signs or symptoms of disease. 90   - Cares for self; unable to carry on normal activity or to do active work. 60   - Requires occasional assistance, but is able to care for most of his personal needs. 50   - Requires considerable assistance and frequent medical care. 34   - Disabled; requires special care and assistance. 31   - Severely  disabled; hospital admission is indicated although death not imminent. 74   - Very sick; hospital admission necessary; active supportive treatment necessary. 10   - Moribund; fatal processes progressing rapidly. 0     - Dead  Karnofsky DA, Abelmann Severance, Craver LS and Burchenal [facility] [phone]) The use of the nitrogen mustards in the palliative treatment of carcinoma: with particular reference to bronchogenic carcinoma Cancer 1 634-56  LABORATORY DATA:  Lab Results  Component Value Date   WBC 4.5 11/14/2016   HGB 13.7 11/14/2016   HCT 42.2 11/14/2016   MCV 84.6 11/14/2016   PLT 205 11/14/2016   Lab Results  Component Value Date   NA 142 11/14/2016   K 4.6 11/14/2016   CL 107 11/14/2016   CO2 27 11/14/2016   Lab Results  Component Value Date   ALT 23 11/14/2016   AST 21 11/14/2016   ALKPHOS 68 11/14/2016   BILITOT 0.7 11/14/2016     RADIOGRAPHY: No results found.    IMPRESSION/PLAN: 1. 72 y.o. gentleman with Stage T1c adenocarcinoma of the prostate with Gleason Score of 3+4, and PSA of 7.08. We discussed the patient's workup and outlines the nature of prostate cancer in this setting. The patient's T stage, Gleason's score, and PSA put him into the favorable intermediate risk group. Accordingly, he is eligible for a variety of potential treatment options including prostatectomy, brachytherapy or 5.5 weeks of external beam radiation. We discussed the available radiation techniques, and focused on the details and logistics and delivery. We discussed and outlined the risks, benefits, short and long-term effects associated with radiotherapy and compared and contrasted these with prostatectomy. We discussed the role of SpaceOAR in reducing the rectal toxicity associated with radiotherapy.  The patient was encouraged to ask questions that were answered to his stated satisfaction.  At the conclusion of our conversation, the patient is interested in moving forward with brachytherapy and use of  SpaceOAR to reduce rectal toxicity from radiotherapy.  We will ensure that seed coverage is adequate to account for the possibility of early  extracapsular extension noted on recent prostate MRI.  We will share our discussion with Dr. Jeffie Pollock and move forward with scheduling his [facility] planning appointment in the near future.  The patient met briefly with Romie Jumper in our office who will be working closely with him to coordinate OR scheduling and pre and post procedure appointments.  We will contact the pharmaceutical rep to ensure that [place] is available at the time of procedure.  He will have a prostate MRI following his post-seed CT SIM to confirm appropriate distribution of the [place].    [name], PA-C    Tyler Pita, MD  [facility] Direct Dial: [phone]  Fax: [phone] [email or web address]  Skype  LinkedIn   This document serves as a record of services personally performed by Tyler Pita, MD and Freeman Caldron, PA-C. It was created on their behalf by Bethann Humble, a trained medical scribe. The creation of this record is based on the scribe's personal observations and the provider's statements to them. This document has been checked and approved by the attending provider.

## 2018-03-13 NOTE — Progress Notes (Signed)
See progress note under physician encounter. 

## 2018-03-14 ENCOUNTER — Encounter: Payer: Self-pay | Admitting: General Practice

## 2018-03-14 NOTE — Progress Notes (Signed)
[facility] Psychosocial Distress Screening Clinical Social Work  Clinical Social Work was referred by distress screening protocol.  The patient scored a 5 on the Psychosocial Distress Thermometer which indicates moderate distress. Clinical Social Worker contacted patient by phone to assess for distress and other psychosocial needs. "Everything is good, we covered everything on Wednesday."  Is waiting for subsequent appointments to be made, no questions at this time.  Lives w wife, daughter lives nearby.  Reviewed services available in [place], information packet mailed.  Encouraged participation in Prostate Cancer support group.    ONCBCN DISTRESS SCREENING 03/13/2018  Screening Type Initial Screening  Distress experienced in past week (1-10) 5  Practical problem type Work/school  Physician notified of physical symptoms Yes  Referral to clinical psychology No  Referral to clinical social work No  Referral to dietition No  Referral to financial advocate No  Referral to support programs No  Referral to palliative care No  Other [phone]    Clinical Social Worker follow up needed: No.  If yes, follow up plan:  Beverely Pace, [place], LCSW Clinical Social Worker Phone:  [phone]

## 2018-03-18 ENCOUNTER — Telehealth: Payer: Self-pay | Admitting: Medical Oncology

## 2018-03-18 NOTE — Telephone Encounter (Signed)
Spoke with Travis Singh to introduce myself as the prostate nurse navigator and my role. I was unable to meet him last week when he consulted with Dr. Tammi Klippel. He states the visit went well and he has chosen brachytherapy with SpaceOAR as treatment. He has not been contacted with dates as of today. I asked him to reach out to me with questions or concerns. He voiced understanding.

## 2018-03-19 ENCOUNTER — Telehealth: Payer: Self-pay | Admitting: *Deleted

## 2018-03-19 NOTE — Telephone Encounter (Signed)
Called patient to inform of pre-seed appt. for 05-16-18, spoke with patient's wife- Denman George and she is aware of these appts.

## 2018-03-25 ENCOUNTER — Other Ambulatory Visit: Payer: Self-pay | Admitting: Urology

## 2018-03-26 ENCOUNTER — Telehealth: Payer: Self-pay | Admitting: *Deleted

## 2018-03-26 NOTE — Telephone Encounter (Signed)
CALLED PATIENT TO INFORM OF IMPLANT DATE, SPOKE WITH PATIENT AND HE IS AWARE OF THIS DATE 

## 2018-04-25 ENCOUNTER — Other Ambulatory Visit: Payer: Self-pay

## 2018-05-14 ENCOUNTER — Other Ambulatory Visit: Payer: Self-pay | Admitting: Family Medicine

## 2018-05-15 ENCOUNTER — Telehealth: Payer: Self-pay | Admitting: *Deleted

## 2018-05-15 NOTE — Telephone Encounter (Signed)
Called patient to remind of pre-seed appts. for 05-16-18, spoke with patient and he is aware of these appts.

## 2018-05-16 ENCOUNTER — Ambulatory Visit (HOSPITAL_COMMUNITY)
Admission: RE | Admit: 2018-05-16 | Discharge: 2018-05-16 | Disposition: A | Discharge status: Home / Self Care | Payer: Medicare Other | Source: Ambulatory Visit | Attending: Urology | Admitting: Urology

## 2018-05-16 ENCOUNTER — Encounter (HOSPITAL_COMMUNITY)
Admission: RE | Admit: 2018-05-16 | Discharge: 2018-05-16 | Disposition: A | Discharge status: Home / Self Care | Payer: Medicare Other | Source: Ambulatory Visit | Attending: Urology | Admitting: Urology

## 2018-05-16 ENCOUNTER — Ambulatory Visit
Admission: RE | Admit: 2018-05-16 | Discharge: 2018-05-16 | Disposition: A | Discharge status: Home / Self Care | Payer: Medicare Other | Source: Ambulatory Visit | Attending: Radiation Oncology | Admitting: Radiation Oncology

## 2018-05-16 DIAGNOSIS — Z01810 Encounter for preprocedural cardiovascular examination: Secondary | ICD-10-CM | POA: Diagnosis not present

## 2018-05-16 DIAGNOSIS — C61 Malignant neoplasm of prostate: Secondary | ICD-10-CM | POA: Insufficient documentation

## 2018-05-16 DIAGNOSIS — Z01818 Encounter for other preprocedural examination: Secondary | ICD-10-CM | POA: Diagnosis not present

## 2018-05-16 DIAGNOSIS — I517 Cardiomegaly: Secondary | ICD-10-CM | POA: Insufficient documentation

## 2018-05-16 NOTE — Progress Notes (Signed)
  Radiation Oncology         [phone] ________________________________  Name: Travis Singh MRN: [id number]  Date: 05/16/2018  DOB: [birth year]  SIMULATION AND TREATMENT PLANNING NOTE PUBIC ARCH STUDY  SX:[id number], Cammie Mcgee, MD  Irine Seal, MD  DIAGNOSIS: 72 y.o. male with Stage T1c adenocarcinoma of the prostate with Gleason Score of 3+4, and PSA of 7.08     ICD-10-CM   1. Prostate cancer ([place]) C61     COMPLEX SIMULATION:  The patient presented today for evaluation for possible prostate seed implant. He was brought to the radiation planning suite and placed supine on the CT couch. A 3-dimensional image study set was obtained in upload to the planning computer. There, on each axial slice, I contoured the prostate gland. Then, using three-dimensional radiation planning tools I reconstructed the prostate in view of the structures from the transperineal needle pathway to assess for possible pubic arch interference. In doing so, I did not appreciate any pubic arch interference. Also, the patient's prostate volume was estimated based on the drawn structure. The volume was 47 cc.  Given the pubic arch appearance and prostate volume, patient remains a good candidate to proceed with prostate seed implant. Today, he freely provided informed written consent to proceed.    PLAN: The patient will undergo prostate seed implant.   ________________________________  Sheral Apley. Tammi Klippel, M.D.  This document serves as a record of services personally performed by Tyler Pita, MD. It was created on his behalf by Rae Lips, a trained medical scribe. The creation of this record is based on the scribe's personal observations and the provider's statements to them. This document has been checked and approved by the attending provider.

## 2018-05-30 DIAGNOSIS — R972 Elevated prostate specific antigen [PSA]: Secondary | ICD-10-CM | POA: Diagnosis not present

## 2018-05-30 DIAGNOSIS — N5201 Erectile dysfunction due to arterial insufficiency: Secondary | ICD-10-CM | POA: Diagnosis not present

## 2018-05-30 DIAGNOSIS — C61 Malignant neoplasm of prostate: Secondary | ICD-10-CM | POA: Diagnosis not present

## 2018-06-05 ENCOUNTER — Other Ambulatory Visit: Payer: Self-pay | Admitting: Urology

## 2018-06-05 DIAGNOSIS — C61 Malignant neoplasm of prostate: Secondary | ICD-10-CM

## 2018-06-13 ENCOUNTER — Encounter (HOSPITAL_BASED_OUTPATIENT_CLINIC_OR_DEPARTMENT_OTHER): Payer: Self-pay | Admitting: *Deleted

## 2018-06-13 ENCOUNTER — Other Ambulatory Visit: Payer: Self-pay

## 2018-06-13 ENCOUNTER — Inpatient Hospital Stay (HOSPITAL_COMMUNITY): Admission: RE | Admit: 2018-06-13 | Payer: Medicare Other | Source: Ambulatory Visit

## 2018-06-13 ENCOUNTER — Encounter (HOSPITAL_COMMUNITY)
Admission: RE | Admit: 2018-06-13 | Discharge: 2018-06-13 | Disposition: A | Discharge status: Home / Self Care | Payer: Medicare Other | Source: Ambulatory Visit | Attending: Urology | Admitting: Urology

## 2018-06-13 DIAGNOSIS — C61 Malignant neoplasm of prostate: Secondary | ICD-10-CM | POA: Diagnosis not present

## 2018-06-13 LAB — CBC
HCT: 42 % (ref 39.0–52.0)
Hemoglobin: 13.8 g/dL (ref 13.0–17.0)
MCH: 28.1 pg (ref 26.0–34.0)
MCHC: 32.9 g/dL (ref 30.0–36.0)
MCV: 85.5 fL (ref 78.0–100.0)
Platelets: 236 10*3/uL (ref 150–400)
RBC: 4.91 MIL/uL (ref 4.22–5.81)
RDW: 13.8 % (ref 11.5–15.5)
WBC: 6 10*3/uL (ref 4.0–10.5)

## 2018-06-13 LAB — COMPREHENSIVE METABOLIC PANEL
ALT: 20 U/L (ref 0–44)
AST: 24 U/L (ref 15–41)
Albumin: 4 g/dL (ref 3.5–5.0)
Alkaline Phosphatase: 51 U/L (ref 38–126)
Anion gap: 12 (ref 5–15)
BUN: 24 mg/dL — ABNORMAL HIGH (ref 8–23)
CO2: 24 mmol/L (ref 22–32)
Calcium: 10 mg/dL (ref 8.9–10.3)
Chloride: 106 mmol/L (ref 98–111)
Creatinine, Ser: 1.44 mg/dL — ABNORMAL HIGH (ref 0.61–1.24)
GFR calc Af Amer: 54 mL/min — ABNORMAL LOW (ref >60)
GFR calc non Af Amer: 46 mL/min — ABNORMAL LOW (ref >60)
Glucose, Bld: 94 mg/dL (ref 70–99)
Potassium: 4.3 mmol/L (ref 3.5–5.1)
Sodium: 142 mmol/L (ref 135–145)
Total Bilirubin: 0.8 mg/dL (ref 0.3–1.2)
Total Protein: 7.4 g/dL (ref 6.5–8.1)

## 2018-06-13 LAB — APTT: aPTT: 28 seconds (ref 24–36)

## 2018-06-13 LAB — PROTIME-INR
INR: 0.91
Prothrombin Time: 12.2 seconds (ref 11.4–15.2)

## 2018-06-13 NOTE — Progress Notes (Signed)
Called pt via phone since no show for lab work appointment today @ 1000.  Per pt was unaware of appointment , but rescheduled appointment for this afternoon @ 1400.

## 2018-06-13 NOTE — Progress Notes (Signed)
Your procedure is scheduled on  __09-26-2019____   Report to [facility] AT  5:30  AM___   Call this number if you have problems the morning of surgery  :[phone].   OUR ADDRESS IS [address].  WE ARE LOCATED IN [facility] [place]                     [facility].                                     REMEMBER:  DO NOT EAT FOOD OR DRINK LIQUIDS AFTER MIDNIGHT .     TAKE THESE MEDICATIONS MORNING OF SURGERY WITH A SIP OF WATER:  ____LIPITOR, VERAPAMIL__________                                    DO NOT WEAR JEWERLY  DO NOT WEAR LOTIONS, POWDERS, PERFUMES OR DEODORANT.  MEN MAY SHAVE FACE AND NECK.  CONTACTS, GLASSES, OR DENTURES MAY NOT BE WORN TO SURGERY.                                    [facility] IS NOT RESPONSIBLE  FOR ANY BELONGINGS.                                                                    Marland Kitchen                                                               REMEMBER TO DO ONE FLEET ENEMA AM DAY OF SURGERY

## 2018-06-13 NOTE — Progress Notes (Signed)
Spoke w/ pt , face to face, for pre-op interview (pt here for lab work).  Npo after mn.  Arrive at 0530.  Getting labs done today (cbc,cmet,pt/inr,ptt).  Current cxr and ekg in chart and epic.  Will take verapamil and lipitor am dos w/ sips of water and will do one fleet enema.  Instructions printed and given to pt.

## 2018-06-14 NOTE — Progress Notes (Signed)
CMP result dated 06-13-2018 sent to dr Jeffie Pollock in epic.

## 2018-06-17 ENCOUNTER — Telehealth: Payer: Self-pay | Admitting: Family Medicine

## 2018-06-17 ENCOUNTER — Other Ambulatory Visit: Payer: Self-pay | Admitting: Family Medicine

## 2018-06-17 MED ORDER — COLCHICINE 0.6 MG PO TABS: 0.6000 mg | ORAL_TABLET | Freq: Every day | ORAL | 3 refills | Status: DC

## 2018-06-17 NOTE — Telephone Encounter (Signed)
Medication called/sent to requested pharmacy and pt aware 

## 2018-06-17 NOTE — Telephone Encounter (Signed)
Patient requesting a refill on his gout medication. He uses [facility] on [address]  Please let patient know when this has been called in.  CB# [phone]

## 2018-06-19 ENCOUNTER — Telehealth: Payer: Self-pay | Admitting: *Deleted

## 2018-06-19 NOTE — Telephone Encounter (Signed)
Called patient to remind of procedure for 06-20-18, spoke with patient and he is aware of this procedure

## 2018-06-19 NOTE — H&P (Signed)
CC: I have prostate cancer.  HPI: Travis Singh is a 72 year-old male established patient who is here evaluation for treatment of prostate cancer.  His prostate cancer was diagnosed 01/31/2017. He does have the pathology report from his biopsy. His PSA at his time of diagnosis was 4.59. His most recent PSA is 5.59.   Travis Singh returns in f/u for a preop visit for his history of prostate cancer that is scheduled to be treated with a seed implant on 06/20/18. He had a surveillance biopsy on 01/31/18. A repeat PSA was 5.59 which is fairly stable. It had been over 7. He was found to have a 22ml prostate with 2 cores of Gleason 7(3+4) on the right and 2 cores on Gleason 6(3+3) disease with one on each side. He had negative biopsies from the MRI ROI. His original biopsy showed Gleason 6(3+3) T1c Nx Mx prostate cancer with 5% in the right lateral apex and 30% in the left mid lateral core. His biopsy was in 5/18. His PSA was 4.59 prior to the initial biopsy but was 7.08 prior to this biopsy. He is voiding well without complaints. He has some ED as well.      AUA Symptom Score: He never has the sensation of not emptying his bladder completely after finishing urinating. Less than 50% of the time he has to urinate again fewer than two hours after he has finished urinating. He does not have to stop and start again several times when he urinates. Less than 50% of the time he finds it difficult to postpone urination. He never has a weak urinary stream. He never has to push or strain to begin urination. He has to get up to urinate 3 times from the time he goes to bed until the time he gets up in the morning.   Calculated AUA Symptom Score: 7    ALLERGIES: None   MEDICATIONS: Hydrochlorothiazide 25 mg tablet  Lisinopril 40 mg tablet  Atorvastatin Calcium 10 mg tablet  Cyclobenzaprine Hcl 10 mg tablet  Verapamil Er 240 mg tablet, extended release     GU PSH: Prostate Needle Biopsy - 02/06/2018, 01/31/2017    NON-GU  PSH: Surgical Pathology, Gross And Microscopic Examination For Prostate Needle - 02/06/2018, 01/31/2017    GU PMH: ED due to arterial insufficiency - 02/21/2018 Prostate Cancer (Worsening), T1c Nx Mx Gleason 7(3+4) with a rising PSA and grade progression after 1 year of surveillance. he has mild LUTS with a 58ml prostate with an IPSS of 7 and ED with a SHIM of 7. I discussed continued surveillance, RALP, EXRT, Seeds, Cryo and HIFU and will get him set up to see Dr. Tammi Klippel to consider a seed implant. I will have him set up for a f/u in 3 months with a PSA since he is likely to want to postpone therapy for a few months. - 02/21/2018, - 02/06/2018, His PSA is up but his exam is unchanged. I will get an MRIP and either do a standard or MR fusion biopsy based on the results since he is due for a surveillance biopsy in 5/19. I have reviewed the risks of bleeding, infection and voiding difficulty. Levaquin sent. , - 12/26/2017, His PSA is stable. I will have him return in 6 months with a PSA for an exam. , - 06/27/2017, He has very low risk Gleason 6 T1c Nx Mx prostate cancer. I reviewed the options as noted below and believe he is an excellent candidate for active surveillance. I  will get him set up to return in 3 months with a PSA. , - 03/07/2017 Elevated PSA - 01/31/2017, He has an elevated PSA with a benign exam. I am going to repeat a PSA today and if it remains elevated, I will have him return for a prostate [place] and biopsy. I have reviewed the risks of bleeding, infection and voiding difficulty. He was given Levaquin. He has proteinuria but is a diabetic and has microalbuminuria on testing from Dr. Dennard Schaumann. , - 01/08/2017    NON-GU PMH: Other fatigue, I am going to get a testosterone level along with a BMP today. - 12/26/2017 Diabetes Type 2 Gout Hypercholesterolemia Hypertension    FAMILY HISTORY: 1 Daughter - Other 3 Son's - Other Cancer - Mother leukemia - Father   SOCIAL HISTORY: Marital Status:  Married Preferred Language: English; Race: Black or African American Current Smoking Status: Patient does not smoke anymore.   Tobacco Use Assessment Completed: Used Tobacco in last 30 days? Drinks 1 caffeinated drink per day.     Notes: Architect work.   REVIEW OF SYSTEMS:    GU Review Male:   Patient reports frequent urination. Patient denies hard to postpone urination, burning/ pain with urination, get up at night to urinate, leakage of urine, stream starts and stops, trouble starting your stream, have to strain to urinate , erection problems, and penile pain.  Gastrointestinal (Upper):   Patient denies nausea, vomiting, and indigestion/ heartburn.  Gastrointestinal (Lower):   Patient denies diarrhea and constipation.  Constitutional:   Patient denies fever, night sweats, weight loss, and fatigue.  Skin:   Patient denies itching and skin rash/ lesion.  Eyes:   Patient denies blurred vision and double vision.  Ears/ Nose/ Throat:   Patient denies sore throat and sinus problems.  Hematologic/Lymphatic:   Patient denies swollen glands and easy bruising.  Cardiovascular:   Patient denies leg swelling and chest pains.  Respiratory:   Patient denies cough and shortness of breath.  Endocrine:   Patient denies excessive thirst.  Musculoskeletal:   Patient reports back pain and joint pain.   Neurological:   Patient denies headaches and dizziness.  Psychologic:   Patient denies depression and anxiety.   VITAL SIGNS:      05/30/2018 03:48 PM  Weight 208 lb / 94.35 kg  Height 68.5 in / 173.99 cm  BP 118/71 mmHg  Pulse 70 /min  Temperature 97.1 F / 36.1 C  BMI 31.2 kg/m   MULTI-SYSTEM PHYSICAL EXAMINATION:    Constitutional: Well-nourished. No physical deformities. Normally developed. Good grooming.  Respiratory: No labored breathing, no use of accessory muscles. CTA  Cardiovascular: Normal temperature, RRR without murmur  Lymphatic: No enlargement, no tenderness of  axillae,supraclavicular, neck lymph nodes.     PAST DATA REVIEWED:  Source Of History:  Patient  Lab Test Review:   PSA  Urine Test Review:   Urinalysis   05/16/18 12/17/17 06/20/17 01/08/17 11/14/16 02/12/15 12/25/13  PSA  Total PSA 5.59 ng/mL 7.08 ng/mL 4.47 ng/mL 4.59 ng/dl 4.8 ng/dl 3.74 ng/dl 3.94 ng/dl  Free PSA    1.14 ng/dl     % Free PSA    25 %       12/26/17  Hormones  Testosterone, Total 381.8 ng/dL    PROCEDURES:          Urinalysis w/Scope - 81001 Dipstick Dipstick Cont'd Micro  Color: Yellow Bilirubin: Neg WBC/hpf: 0 - 5/hpf  Appearance: Clear Ketones: Neg RBC/hpf: 0 - 2/hpf  Specific  Gravity: 1.020 Blood: Neg Bacteria: NS (Not Seen)  pH: <=5.0 Protein: 2+ Cystals: NS (Not Seen)  Glucose: Neg Urobilinogen: 0.2 Casts: Hyaline    Nitrites: Neg Trichomonas: Not Present    Leukocyte Esterase: Neg Mucous: Not Present      Epithelial Cells: 0 - 5/hpf      Yeast: NS (Not Seen)      Sperm: Not Present    Notes:      ASSESSMENT:      ICD-10 Details  1 GU:   Prostate Cancer - C61 Stable - His PSA is below his last prebiopsy level. We will proceed with a seed implant with SpaceOAR on 9/26. He had no additional questions.   2   Elevated PSA - R97.20 Stable  3   ED due to arterial insufficiency - N52.01 He has preexisting ED.    PLAN:           Schedule Return Visit/Planned Activity: Keep Scheduled Appointment             Note: He is on for a seed implant on 9/26 but can cancel the appointment with [place] on 9/10.           Document Letter(s):  Created for Patient: Clinical Summary         Next Appointment:      Next Appointment: 06/20/2018 07:30 AM    Appointment Type: Surgery     Location: [facility] [phone]    Provider: Irine Seal, M.D.    Reason for Visit: OP [place]

## 2018-06-20 ENCOUNTER — Encounter (HOSPITAL_BASED_OUTPATIENT_CLINIC_OR_DEPARTMENT_OTHER)
Admission: RE | Disposition: A | Discharge status: Home / Self Care | Payer: Self-pay | Source: Ambulatory Visit | Attending: Urology

## 2018-06-20 ENCOUNTER — Ambulatory Visit (HOSPITAL_COMMUNITY): Payer: Medicare Other

## 2018-06-20 ENCOUNTER — Ambulatory Visit (HOSPITAL_BASED_OUTPATIENT_CLINIC_OR_DEPARTMENT_OTHER)
Admission: RE | Admit: 2018-06-20 | Discharge: 2018-06-20 | Disposition: A | Discharge status: Home / Self Care | Payer: Medicare Other | Source: Ambulatory Visit | Attending: Urology | Admitting: Urology

## 2018-06-20 ENCOUNTER — Ambulatory Visit (HOSPITAL_BASED_OUTPATIENT_CLINIC_OR_DEPARTMENT_OTHER): Payer: Medicare Other | Admitting: Certified Registered"

## 2018-06-20 ENCOUNTER — Encounter (HOSPITAL_BASED_OUTPATIENT_CLINIC_OR_DEPARTMENT_OTHER): Payer: Self-pay | Admitting: Certified Registered"

## 2018-06-20 DIAGNOSIS — E7800 Pure hypercholesterolemia, unspecified: Secondary | ICD-10-CM | POA: Insufficient documentation

## 2018-06-20 DIAGNOSIS — Z809 Family history of malignant neoplasm, unspecified: Secondary | ICD-10-CM | POA: Insufficient documentation

## 2018-06-20 DIAGNOSIS — M109 Gout, unspecified: Secondary | ICD-10-CM | POA: Diagnosis not present

## 2018-06-20 DIAGNOSIS — I10 Essential (primary) hypertension: Secondary | ICD-10-CM | POA: Diagnosis not present

## 2018-06-20 DIAGNOSIS — C61 Malignant neoplasm of prostate: Secondary | ICD-10-CM | POA: Insufficient documentation

## 2018-06-20 DIAGNOSIS — Z79899 Other long term (current) drug therapy: Secondary | ICD-10-CM | POA: Diagnosis not present

## 2018-06-20 DIAGNOSIS — E119 Type 2 diabetes mellitus without complications: Secondary | ICD-10-CM | POA: Insufficient documentation

## 2018-06-20 DIAGNOSIS — Z806 Family history of leukemia: Secondary | ICD-10-CM | POA: Insufficient documentation

## 2018-06-20 DIAGNOSIS — I771 Stricture of artery: Secondary | ICD-10-CM | POA: Diagnosis not present

## 2018-06-20 HISTORY — DX: Other intervertebral disc degeneration, lumbar region: M51.36

## 2018-06-20 HISTORY — DX: Sciatica, left side: M54.32

## 2018-06-20 HISTORY — PX: SPACE OAR INSTILLATION: SHX6769

## 2018-06-20 HISTORY — DX: Prediabetes: R73.03

## 2018-06-20 HISTORY — DX: Nocturia: R35.1

## 2018-06-20 HISTORY — DX: Personal history of adenomatous and serrated colon polyps: Z86.0101

## 2018-06-20 HISTORY — DX: Presence of spectacles and contact lenses: Z97.3

## 2018-06-20 HISTORY — DX: Other intervertebral disc degeneration, lumbar region without mention of lumbar back pain or lower extremity pain: M51.369

## 2018-06-20 HISTORY — DX: Hyperlipidemia, unspecified: E78.5

## 2018-06-20 HISTORY — PX: CYSTOSCOPY: SHX5120

## 2018-06-20 HISTORY — PX: RADIOACTIVE SEED IMPLANT: SHX5150

## 2018-06-20 HISTORY — DX: Spinal stenosis, lumbar region without neurogenic claudication: M48.061

## 2018-06-20 HISTORY — DX: Other intervertebral disc displacement, lumbar region: M51.26

## 2018-06-20 HISTORY — DX: Male erectile dysfunction, unspecified: N52.9

## 2018-06-20 HISTORY — DX: Personal history of colonic polyps: Z86.010

## 2018-06-20 LAB — GLUCOSE, CAPILLARY: Glucose-Capillary: 141 mg/dL — ABNORMAL HIGH (ref 70–99)

## 2018-06-20 SURGERY — INSERTION, RADIATION SOURCE, PROSTATE
Anesthesia: General | Site: Rectum

## 2018-06-20 MED ORDER — SUCCINYLCHOLINE CHLORIDE 200 MG/10ML IV SOSY
PREFILLED_SYRINGE | INTRAVENOUS | Status: DC | PRN
  Administered 2018-06-20: 140 mg via INTRAVENOUS

## 2018-06-20 MED ORDER — MORPHINE SULFATE (PF) 2 MG/ML IV SOLN
2.0000 mg | INTRAVENOUS | Status: DC | PRN
  Filled 2018-06-20: qty 1

## 2018-06-20 MED ORDER — FLEET ENEMA 7-19 GM/118ML RE ENEM
1.0000 | ENEMA | Freq: Once | RECTAL | Status: DC
  Filled 2018-06-20: qty 1

## 2018-06-20 MED ORDER — OXYCODONE HCL 5 MG PO TABS
5.0000 mg | ORAL_TABLET | Freq: Once | ORAL | Status: DC | PRN
  Filled 2018-06-20: qty 1

## 2018-06-20 MED ORDER — OXYCODONE HCL 5 MG PO TABS
5.0000 mg | ORAL_TABLET | ORAL | Status: DC | PRN
  Filled 2018-06-20: qty 2

## 2018-06-20 MED ORDER — SODIUM CHLORIDE 0.9 % IV SOLN
INTRAVENOUS | Status: DC | PRN
  Administered 2018-06-20: 40 ug/min via INTRAVENOUS

## 2018-06-20 MED ORDER — SODIUM CHLORIDE 0.9 % IR SOLN
Status: DC | PRN
  Administered 2018-06-20: 1000 mL

## 2018-06-20 MED ORDER — PHENYLEPHRINE 40 MCG/ML (10ML) SYRINGE FOR IV PUSH (FOR BLOOD PRESSURE SUPPORT)
PREFILLED_SYRINGE | INTRAVENOUS | Status: AC
  Filled 2018-06-20: qty 10

## 2018-06-20 MED ORDER — SODIUM CHLORIDE 0.9 % IJ SOLN
INTRAMUSCULAR | Status: DC | PRN
  Administered 2018-06-20: 10 mL via INTRAVENOUS

## 2018-06-20 MED ORDER — PHENYLEPHRINE HCL 10 MG/ML IJ SOLN
INTRAMUSCULAR | Status: AC
  Filled 2018-06-20: qty 2

## 2018-06-20 MED ORDER — DEXAMETHASONE SODIUM PHOSPHATE 10 MG/ML IJ SOLN
INTRAMUSCULAR | Status: DC | PRN
  Administered 2018-06-20: 10 mg via INTRAVENOUS

## 2018-06-20 MED ORDER — ACETAMINOPHEN 325 MG PO TABS
650.0000 mg | ORAL_TABLET | ORAL | Status: DC | PRN
  Filled 2018-06-20: qty 2

## 2018-06-20 MED ORDER — LIDOCAINE 2% (20 MG/ML) 5 ML SYRINGE
INTRAMUSCULAR | Status: DC | PRN
  Administered 2018-06-20: 80 mg via INTRAVENOUS

## 2018-06-20 MED ORDER — PHENYLEPHRINE 40 MCG/ML (10ML) SYRINGE FOR IV PUSH (FOR BLOOD PRESSURE SUPPORT)
PREFILLED_SYRINGE | INTRAVENOUS | Status: DC | PRN
  Administered 2018-06-20: 80 ug via INTRAVENOUS
  Administered 2018-06-20 (×2): 120 ug via INTRAVENOUS
  Administered 2018-06-20: 80 ug via INTRAVENOUS

## 2018-06-20 MED ORDER — PROPOFOL 10 MG/ML IV BOLUS
INTRAVENOUS | Status: DC | PRN
  Administered 2018-06-20: 150 mg via INTRAVENOUS

## 2018-06-20 MED ORDER — DEXAMETHASONE SODIUM PHOSPHATE 10 MG/ML IJ SOLN
INTRAMUSCULAR | Status: AC
  Filled 2018-06-20: qty 1

## 2018-06-20 MED ORDER — GLYCOPYRROLATE 0.2 MG/ML IJ SOLN
INTRAMUSCULAR | Status: DC | PRN
  Administered 2018-06-20 (×2): 0.1 mg via INTRAVENOUS

## 2018-06-20 MED ORDER — SODIUM CHLORIDE 0.9 % IV SOLN
250.0000 mL | INTRAVENOUS | Status: DC | PRN
  Filled 2018-06-20: qty 250

## 2018-06-20 MED ORDER — FENTANYL CITRATE (PF) 100 MCG/2ML IJ SOLN
INTRAMUSCULAR | Status: AC
  Filled 2018-06-20: qty 2

## 2018-06-20 MED ORDER — ACETAMINOPHEN 650 MG RE SUPP
650.0000 mg | RECTAL | Status: DC | PRN
  Filled 2018-06-20: qty 1

## 2018-06-20 MED ORDER — ACETAMINOPHEN 10 MG/ML IV SOLN
1000.0000 mg | Freq: Once | INTRAVENOUS | Status: DC | PRN
  Filled 2018-06-20: qty 100

## 2018-06-20 MED ORDER — TRAMADOL HCL 50 MG PO TABS: 50.0000 mg | ORAL_TABLET | Freq: Four times a day (QID) | ORAL | 0 refills | Status: AC | PRN

## 2018-06-20 MED ORDER — GLYCOPYRROLATE PF 0.2 MG/ML IJ SOSY
PREFILLED_SYRINGE | INTRAMUSCULAR | Status: AC
  Filled 2018-06-20: qty 1

## 2018-06-20 MED ORDER — MIDAZOLAM HCL 5 MG/5ML IJ SOLN
INTRAMUSCULAR | Status: DC | PRN
  Administered 2018-06-20: 1 mg via INTRAVENOUS

## 2018-06-20 MED ORDER — PROMETHAZINE HCL 25 MG/ML IJ SOLN
6.2500 mg | INTRAMUSCULAR | Status: DC | PRN
  Filled 2018-06-20: qty 1

## 2018-06-20 MED ORDER — PROPOFOL 10 MG/ML IV BOLUS
INTRAVENOUS | Status: AC
  Filled 2018-06-20: qty 20

## 2018-06-20 MED ORDER — LACTATED RINGERS IV SOLN
INTRAVENOUS | Status: DC
  Administered 2018-06-20: 1000 mL via INTRAVENOUS
  Administered 2018-06-20 (×2): via INTRAVENOUS
  Filled 2018-06-20: qty 1000

## 2018-06-20 MED ORDER — SODIUM CHLORIDE 0.9% FLUSH
3.0000 mL | INTRAVENOUS | Status: DC | PRN
  Filled 2018-06-20: qty 3

## 2018-06-20 MED ORDER — FENTANYL CITRATE (PF) 100 MCG/2ML IJ SOLN
25.0000 ug | INTRAMUSCULAR | Status: DC | PRN
  Filled 2018-06-20: qty 1

## 2018-06-20 MED ORDER — ONDANSETRON HCL 4 MG/2ML IJ SOLN
INTRAMUSCULAR | Status: AC
  Filled 2018-06-20: qty 2

## 2018-06-20 MED ORDER — CIPROFLOXACIN IN D5W 400 MG/200ML IV SOLN
INTRAVENOUS | Status: AC
  Filled 2018-06-20: qty 200

## 2018-06-20 MED ORDER — SODIUM CHLORIDE 0.9% FLUSH
3.0000 mL | Freq: Two times a day (BID) | INTRAVENOUS | Status: DC
  Filled 2018-06-20: qty 3

## 2018-06-20 MED ORDER — OXYCODONE HCL 5 MG/5ML PO SOLN
5.0000 mg | Freq: Once | ORAL | Status: DC | PRN
  Filled 2018-06-20: qty 5

## 2018-06-20 MED ORDER — CIPROFLOXACIN IN D5W 400 MG/200ML IV SOLN
400.0000 mg | INTRAVENOUS | Status: AC
  Administered 2018-06-20: 400 mg via INTRAVENOUS
  Filled 2018-06-20: qty 200

## 2018-06-20 MED ORDER — MIDAZOLAM HCL 2 MG/2ML IJ SOLN
INTRAMUSCULAR | Status: AC
  Filled 2018-06-20: qty 2

## 2018-06-20 MED ORDER — ONDANSETRON HCL 4 MG/2ML IJ SOLN
INTRAMUSCULAR | Status: DC | PRN
  Administered 2018-06-20: 4 mg via INTRAVENOUS

## 2018-06-20 MED ORDER — FENTANYL CITRATE (PF) 100 MCG/2ML IJ SOLN
INTRAMUSCULAR | Status: DC | PRN
  Administered 2018-06-20: 50 ug via INTRAVENOUS
  Administered 2018-06-20: 25 ug via INTRAVENOUS
  Administered 2018-06-20: 50 ug via INTRAVENOUS

## 2018-06-20 MED ORDER — LIDOCAINE 2% (20 MG/ML) 5 ML SYRINGE
INTRAMUSCULAR | Status: AC
  Filled 2018-06-20: qty 5

## 2018-06-20 MED ORDER — IOHEXOL 300 MG/ML  SOLN
INTRAMUSCULAR | Status: DC | PRN
  Administered 2018-06-20: 7 mL

## 2018-06-20 MED ORDER — EPHEDRINE SULFATE-NACL 50-0.9 MG/10ML-% IV SOSY
PREFILLED_SYRINGE | INTRAVENOUS | Status: DC | PRN
  Administered 2018-06-20 (×3): 10 mg via INTRAVENOUS

## 2018-06-20 SURGICAL SUPPLY — 50 items
BAG URINE DRAINAGE (UROLOGICAL SUPPLIES) ×4 IMPLANT
BLADE CLIPPER SURG (BLADE) ×4 IMPLANT
CATH FOLEY 2WAY SLVR  5CC 16FR (CATHETERS) ×1
CATH FOLEY 2WAY SLVR 5CC 16FR (CATHETERS) ×3 IMPLANT
CATH ROBINSON RED A/P 16FR (CATHETERS) IMPLANT
CATH ROBINSON RED A/P 20FR (CATHETERS) ×4 IMPLANT
CLOTH BEACON ORANGE TIMEOUT ST (SAFETY) ×4 IMPLANT
CONT SPECI 4OZ STER CLIK (MISCELLANEOUS) ×8 IMPLANT
COVER BACK TABLE 60X90IN (DRAPES) ×4 IMPLANT
COVER MAYO STAND STRL (DRAPES) ×4 IMPLANT
DRSG TEGADERM 4X4.75 (GAUZE/BANDAGES/DRESSINGS) ×4 IMPLANT
DRSG TEGADERM 8X12 (GAUZE/BANDAGES/DRESSINGS) ×4 IMPLANT
GAUZE SPONGE 4X4 12PLY STRL (GAUZE/BANDAGES/DRESSINGS) ×4 IMPLANT
GLOVE BIO SURGEON STRL SZ 6 (GLOVE) IMPLANT
GLOVE BIO SURGEON STRL SZ 6.5 (GLOVE) IMPLANT
GLOVE BIO SURGEON STRL SZ7 (GLOVE) ×8 IMPLANT
GLOVE BIO SURGEON STRL SZ8 (GLOVE) IMPLANT
GLOVE BIOGEL PI IND STRL 6 (GLOVE) IMPLANT
GLOVE BIOGEL PI IND STRL 6.5 (GLOVE) IMPLANT
GLOVE BIOGEL PI IND STRL 7.5 (GLOVE) ×9 IMPLANT
GLOVE BIOGEL PI IND STRL 8 (GLOVE) IMPLANT
GLOVE BIOGEL PI IND STRL 8.5 (GLOVE) ×3 IMPLANT
GLOVE BIOGEL PI INDICATOR 6 (GLOVE)
GLOVE BIOGEL PI INDICATOR 6.5 (GLOVE)
GLOVE BIOGEL PI INDICATOR 7.5 (GLOVE) ×3
GLOVE BIOGEL PI INDICATOR 8 (GLOVE)
GLOVE BIOGEL PI INDICATOR 8.5 (GLOVE) ×1
GLOVE ECLIPSE 7.0 STRL STRAW (GLOVE) ×4 IMPLANT
GLOVE ECLIPSE 8.0 STRL XLNG CF (GLOVE) ×16 IMPLANT
GLOVE INDICATOR 7.0 STRL GRN (GLOVE) IMPLANT
GLOVE INDICATOR 8.5 STRL (GLOVE) ×4 IMPLANT
GLOVE SURG SS PI 8.0 STRL IVOR (GLOVE) ×8 IMPLANT
GOWN STRL REUS W/ TWL XL LVL3 (GOWN DISPOSABLE) ×3 IMPLANT
GOWN STRL REUS W/TWL LRG LVL3 (GOWN DISPOSABLE) ×8 IMPLANT
GOWN STRL REUS W/TWL XL LVL3 (GOWN DISPOSABLE) ×9 IMPLANT
HOLDER FOLEY CATH W/STRAP (MISCELLANEOUS) ×4 IMPLANT
I-SEEDAGX100 ×4 IMPLANT
IMPL SPACEOAR SYSTEM 10ML (Spacer) ×3 IMPLANT
IMPLANT SPACEOAR SYSTEM 10ML (Spacer) ×4 IMPLANT
IV NS 1000ML (IV SOLUTION) ×1
IV NS 1000ML BAXH (IV SOLUTION) ×3 IMPLANT
KIT TURNOVER CYSTO (KITS) ×4 IMPLANT
MARKER SKIN DUAL TIP RULER LAB (MISCELLANEOUS) ×4 IMPLANT
PACK CYSTO (CUSTOM PROCEDURE TRAY) ×4 IMPLANT
SURGILUBE 2OZ TUBE FLIPTOP (MISCELLANEOUS) IMPLANT
SUT BONE WAX W31G (SUTURE) ×4 IMPLANT
SYR 10ML LL (SYRINGE) ×4 IMPLANT
UNDERPAD 30X30 (UNDERPADS AND DIAPERS) ×8 IMPLANT
WATER STERILE IRR 3000ML UROMA (IV SOLUTION) ×4 IMPLANT
WATER STERILE IRR 500ML POUR (IV SOLUTION) ×4 IMPLANT

## 2018-06-20 NOTE — Anesthesia Preprocedure Evaluation (Signed)
Anesthesia Evaluation  Patient identified by MRN, date of birth, ID band Patient awake    Reviewed: Allergy & Precautions, NPO status , Patient's Chart, lab work & pertinent test results  Airway Mallampati: II  TM Distance: >3 FB Neck ROM: Full    Dental no notable dental hx.    Pulmonary Current Smoker,    Pulmonary exam normal breath sounds clear to auscultation       Cardiovascular hypertension, Normal cardiovascular exam Rhythm:Regular Rate:Normal     Neuro/Psych negative neurological ROS  negative psych ROS   GI/Hepatic negative GI ROS, Neg liver ROS,   Endo/Other  negative endocrine ROS  Renal/GU negative Renal ROS  negative genitourinary   Musculoskeletal negative musculoskeletal ROS (+)   Abdominal   Peds negative pediatric ROS (+)  Hematology negative hematology ROS (+)   Anesthesia Other Findings   Reproductive/Obstetrics negative OB ROS                             Anesthesia Physical Anesthesia Plan  ASA: II  Anesthesia Plan: General   Post-op Pain Management:    Induction: Intravenous  PONV Risk Score and Plan: 2 and Ondansetron, Dexamethasone and Treatment may vary due to age or medical condition  Airway Management Planned: LMA  Additional Equipment:   Intra-op Plan:   Post-operative Plan: Extubation in OR  Informed Consent: I have reviewed the patients History and Physical, chart, labs and discussed the procedure including the risks, benefits and alternatives for the proposed anesthesia with the patient or authorized representative who has indicated his/her understanding and acceptance.   Dental advisory given  Plan Discussed with: CRNA and Surgeon  Anesthesia Plan Comments:         Anesthesia Quick Evaluation

## 2018-06-20 NOTE — Anesthesia Procedure Notes (Signed)
Procedure Name: LMA Insertion Date/Time: 06/20/2018 7:44 AM Performed by: Gwyndolyn Saxon, CRNA Pre-anesthesia Checklist: Patient identified, Emergency Drugs available, Suction available and Patient being monitored Patient Re-evaluated:Patient Re-evaluated prior to induction Oxygen Delivery Method: Circle system utilized Preoxygenation: Pre-oxygenation with 100% oxygen Induction Type: IV induction Ventilation: Mask ventilation without difficulty and Oral airway inserted - appropriate to patient size LMA: LMA inserted LMA Size: 4.0 Number of attempts: 1 Placement Confirmation: positive ETCO2,  CO2 detector and breath sounds checked- equal and bilateral Tube secured with: Tape Dental Injury: Teeth and Oropharynx as per pre-operative assessment

## 2018-06-20 NOTE — Anesthesia Procedure Notes (Signed)
Procedure Name: LMA Insertion Date/Time: 06/20/2018 7:47 AM Performed by: Gwyndolyn Saxon, CRNA Pre-anesthesia Checklist: Emergency Drugs available, Suction available, Patient being monitored and Patient identified Patient Re-evaluated:Patient Re-evaluated prior to induction Oxygen Delivery Method: Circle system utilized Preoxygenation: Pre-oxygenation with 100% oxygen Induction Type: IV induction and Inhalational induction LMA: LMA inserted LMA Size: 5.0 Number of attempts: 1 Placement Confirmation: positive ETCO2,  CO2 detector and breath sounds checked- equal and bilateral Dental Injury: Teeth and Oropharynx as per pre-operative assessment

## 2018-06-20 NOTE — Transfer of Care (Signed)
Immediate Anesthesia Transfer of Care Note  Patient: Travis Singh  Procedure(s) Performed: RADIOACTIVE SEED IMPLANT/BRACHYTHERAPY IMPLANT (N/A Prostate) SPACE OAR INSTILLATION (N/A Rectum) CYSTOSCOPY FLEXIBLE (Bladder)  Patient Location: PACU  Anesthesia Type:General  Level of Consciousness: drowsy and patient cooperative  Airway & Oxygen Therapy: Patient Spontanous Breathing and Patient connected to face mask oxygen  Post-op Assessment: Report given to RN and Post -op Vital signs reviewed and stable  Post vital signs: Reviewed and stable  Last Vitals:  Vitals Value Taken Time  BP 107/78 06/20/2018  9:30 AM  Temp    Pulse 67 06/20/2018  9:31 AM  Resp 17 06/20/2018  9:31 AM  SpO2 100 % 06/20/2018  9:31 AM  Vitals shown include unvalidated device data.  Last Pain:  Vitals:   06/20/18 0542  TempSrc: Oral         Complications: No apparent anesthesia complications

## 2018-06-20 NOTE — Discharge Instructions (Addendum)
Brachytherapy for Prostate Cancer, Care After Refer to this sheet in the next few weeks. These instructions provide you with information on caring for yourself after your procedure. Your health care provider may also give you more specific instructions. Your treatment has been planned according to current medical practices, but problems sometimes occur. Call your health care provider if you have any problems or questions after your procedure. What can I expect after the procedure? The area behind the scrotum will probably be tender and bruised. For a short period of time you may have:  Difficulty passing urine. You may need a catheter for a few days to a month.  Blood in the urine or semen.  A feeling of constipation because of prostate swelling.  Frequent feeling of an urgent need to urinate.  For a long period of time you may have:  Inflammation of the rectum. This happens in about 2% of people who have the procedure.  Erection problems. These vary with age and occur in about 15-40% of men.  Difficulty urinating. This is caused by scarring in the urethra.  Diarrhea.  Follow these instructions at home:  Take medicines only as directed by your health care provider.  You will probably have a catheter in your bladder for several days. You will have blood in the urine bag and should drink a lot of fluids to keep it a light red color.  Keep all follow-up visits as directed by your health care provider. If you have a catheter, it will be removed during one of these visits.  Try not to sit directly on the area behind the scrotum. A soft cushion can decrease the discomfort. Ice packs may also be helpful for the discomfort. Do not put ice directly on the skin.  Shower and wash the area behind the scrotum gently. Do not sit in a tub.  If you have had the brachytherapy that uses the seeds, limit your close contact with children and pregnant women for 2 months because of the radiation still  in the prostate. After that period of time, the levels drop off quickly. Get help right away if:  You have a fever.  You have chills.  You have shortness of breath.  You have chest pain.  You have thick blood, like tomato juice, in the urine bag.  Your catheter is blocked so urine cannot get into the bag. Your bladder area or lower abdomen may be swollen.  There is excessive bleeding from your rectum. It is normal to have a little blood mixed with your stool.  There is severe discomfort in the treated area that does not go away with pain medicine.  You have abdominal discomfort.  You have severe nausea or vomiting.  You develop any new or unusual symptoms. This information is not intended to replace advice given to you by your health care provider. Make sure you discuss any questions you have with your health care provider. Document Released: 10/14/2010 Document Revised: 02/23/2016 Document Reviewed: 03/04/2013 Elsevier Interactive Patient Education  2017 [employer] [facility] Care Instructions  Activity: Get plenty of rest for the remainder of the day. A responsible individual must stay with you for 24 hours following the procedure.  For the next 24 hours, DO NOT: -Drive a car -Paediatric nurse -Drink alcoholic beverages -Take any medication unless instructed by your physician -Make any legal decisions or sign important papers.  Meals: Start with liquid foods such as gelatin or soup. Progress  to regular foods as tolerated. Avoid greasy, spicy, heavy foods. If nausea and/or vomiting occur, drink only clear liquids until the nausea and/or vomiting subsides. Call your physician if vomiting continues.  Special Instructions/Symptoms: Your throat may feel dry or sore from the anesthesia or the breathing tube placed in your throat during surgery. If this causes discomfort, gargle with warm salt water. The discomfort should disappear within 24 hours.

## 2018-06-20 NOTE — Anesthesia Postprocedure Evaluation (Signed)
Anesthesia Post Note  Patient: Travis Singh  Procedure(s) Performed: RADIOACTIVE SEED IMPLANT/BRACHYTHERAPY IMPLANT (N/A Prostate) SPACE OAR INSTILLATION (N/A Rectum) CYSTOSCOPY FLEXIBLE (Bladder)     Patient location during evaluation: PACU Anesthesia Type: General Level of consciousness: awake and alert Pain management: pain level controlled Vital Signs Assessment: post-procedure vital signs reviewed and stable Respiratory status: spontaneous breathing, nonlabored ventilation, respiratory function stable and patient connected to nasal cannula oxygen Cardiovascular status: blood pressure returned to baseline and stable Postop Assessment: no apparent nausea or vomiting Anesthetic complications: no    Last Vitals:  Vitals:   06/20/18 1017 06/20/18 1029  BP: 98/72   Pulse: 62 60  Resp: 17 20  Temp:    SpO2: 99% 97%    Last Pain:  Vitals:   06/20/18 0945  TempSrc:   PainSc: 0-No pain                 Madalin Hughart S

## 2018-06-20 NOTE — Interval H&P Note (Signed)
History and Physical Interval Note:  06/20/2018 7:13 AM  Travis Singh  has presented today for surgery, with the diagnosis of PROSTATE CANCER  The various methods of treatment have been discussed with the patient and family. After consideration of risks, benefits and other options for treatment, the patient has consented to  Procedure(s): RADIOACTIVE SEED IMPLANT/BRACHYTHERAPY IMPLANT (N/A) SPACE OAR INSTILLATION (N/A) as a surgical intervention .  The patient's history has been reviewed, patient examined, no change in status, stable for surgery.  I have reviewed the patient's chart and labs.  Questions were answered to the patient's satisfaction.     Irine Seal

## 2018-06-20 NOTE — Anesthesia Procedure Notes (Signed)
Procedure Name: Intubation Date/Time: 06/20/2018 7:50 AM Performed by: Gwyndolyn Saxon, CRNA Pre-anesthesia Checklist: Patient identified, Emergency Drugs available, Suction available and Patient being monitored Patient Re-evaluated:Patient Re-evaluated prior to induction Oxygen Delivery Method: Circle system utilized Preoxygenation: Pre-oxygenation with 100% oxygen Induction Type: IV induction and Inhalational induction Laryngoscope Size: Miller and 2 Grade View: Grade III Tube type: Oral Tube size: 7.0 mm Number of attempts: 1 Airway Equipment and Method: Patient positioned with wedge pillow Placement Confirmation: ETT inserted through vocal cords under direct vision,  positive ETCO2 and breath sounds checked- equal and bilateral Secured at: 23 cm Tube secured with: Tape Dental Injury: Teeth and Oropharynx as per pre-operative assessment  Difficulty Due To: Difficult Airway- due to large tongue Comments: Attempted LMA #4 and #5; good seat with both,. Unable to achieve adequate TV with appropriate peak pressue.

## 2018-06-20 NOTE — Op Note (Signed)
PATIENT:  Travis Singh  PRE-OPERATIVE DIAGNOSIS:  Adenocarcinoma of the prostate  POST-OPERATIVE DIAGNOSIS:  Same  PROCEDURE:  Procedure(s): 1. I-125 radioactive seed implantation 2. SpaceOAR implant. 3. Cystoscopy  SURGEON:  Surgeon(s): Irine Seal MD  Radiation oncologist: Dr. Tyler Pita  ANESTHESIA:  General  EBL:  Minimal  DRAINS: 16 French Foley catheter  INDICATION: [name] is a 71 y.o. with Stage T1c, Gleason 7(3+4) prostate cancer who has elected brachytherapy for treatment.  Description of procedure: After informed consent the patient was brought to the major OR, placed on the table and administered general anesthesia. He was then moved to the modified lithotomy position with his perineum perpendicular to the floor. His perineum and genitalia were then sterilely prepped. An official timeout was then performed. A 16 French Foley catheter was then placed in the bladder and filled with dilute contrast, a rectal tube was placed in the rectum and the transrectal ultrasound probe was placed in the rectum and affixed to the stand. He was then sterilely draped.  The sterile grid was installed.   Anchor needles were then placed.   Real time ultrasonography was used along with the seed planning software spot-pro version 3.1-00. This was used to develop the seed plan including the number of needles as well as number of seeds required for complete and adequate coverage. Real-time ultrasonography was then used along with the previously developed plan and the Nucletron device to implant a total of 86 seeds using 27 needles for a target dose of 145 Gy. This proceeded without difficulty or complication.  After completion of the implant the fixation needles and guide were removed and the SpaceOAR gel was placed in the standard fashion posterior to the prostate after confirmation of proper placement of the needle using [place] and saline injection throught the spinal needle.  Good  distribution was noted  A Foley catheter was then removed as well as the transrectal ultrasound probe and rectal probe. Flexible cystoscopy was then performed using the 17 French flexible scope which revealed a normal urethra throughout its length down to the sphincter which appeared intact. The prostatic urethra was 3cm with bilobar hyperplasia with some coaptation. The bladder was then entered and fully and systematically inspected.  The ureteral orifices were noted to be of normal configuration and position. The mucosa revealed no evidence of tumors. There were also no stones identified within the bladder.  no seeds or spacers were seen and/or removed from the bladder.  The cystoscope was then removed.  The drapes were removed.  The perineum was cleaned and dressed.  He was taken out of the lithotomy position and was awakened and taken to recovery room in stable and satisfactory condition. He tolerated procedure well and there were no intraoperative complications.

## 2018-06-21 ENCOUNTER — Encounter (HOSPITAL_BASED_OUTPATIENT_CLINIC_OR_DEPARTMENT_OTHER): Payer: Self-pay | Admitting: Urology

## 2018-06-23 NOTE — Progress Notes (Signed)
  Radiation Oncology         [phone] ________________________________  Name: Travis Singh MRN: [id number]  Date: 06/23/2018  DOB: 1946/02/24       Prostate Seed Implant  ON:[id number], Cammie Mcgee, MD  No ref. provider found  DIAGNOSIS: 72 y.o. gentleman with Stage T1c adenocarcinoma of the prostate with Gleason Score of 3+4, and PSA of 7.08  PROCEDURE: Insertion of radioactive I-125 seeds into the prostate gland.  RADIATION DOSE: 145 Gy, definitive therapy.  TECHNIQUE: Travis Singh was brought to the operating room with the urologist. He was placed in the dorsolithotomy position. He was catheterized and a rectal tube was inserted. The perineum was shaved, prepped and draped. The ultrasound probe was then introduced into the rectum to see the prostate gland.  TREATMENT DEVICE: A needle grid was attached to the ultrasound probe stand and anchor needles were placed.  3D PLANNING: The prostate was imaged in 3D using a sagittal sweep of the prostate probe. These images were transferred to the planning computer. There, the prostate, urethra and rectum were defined on each axial reconstructed image. Then, the software created an optimized 3D plan and a few seed positions were adjusted. The quality of the plan was reviewed using [facility] information for the target and the following two organs at risk:  Urethra and Rectum.  Then the accepted plan was printed and handed off to the radiation therapist.  Under my supervision, the custom loading of the seeds and spacers was carried out and loaded into sealed vicryl sleeves.  These pre-loaded needles were then placed into the needle holder.Marland Kitchen  PROSTATE VOLUME STUDY:  Using transrectal ultrasound the volume of the prostate was verified to be 51.9 cc.  SPECIAL TREATMENT PROCEDURE/SUPERVISION AND HANDLING: The pre-loaded needles were then delivered under sagittal guidance. A total of 27 needles were used to deposit 86 seeds in the prostate gland. The  individual seed activity was 0.452 mCi.  SpaceOAR:  Yes  COMPLEX SIMULATION: At the end of the procedure, an anterior radiograph of the pelvis was obtained to document seed positioning and count. Cystoscopy was performed to check the urethra and bladder.  MICRODOSIMETRY: At the end of the procedure, the patient was emitting 0.141 mR/hr at 1 meter. Accordingly, he was considered safe for hospital discharge.  PLAN: The patient will return to the radiation oncology clinic for post implant CT dosimetry in three weeks.   ________________________________  Sheral Apley Tammi Klippel, M.D.

## 2018-06-28 ENCOUNTER — Ambulatory Visit: Payer: Medicare Other | Admitting: Radiation Oncology

## 2018-07-10 ENCOUNTER — Telehealth: Payer: Self-pay | Admitting: *Deleted

## 2018-07-10 NOTE — Telephone Encounter (Signed)
Called patient to remind of post seed appts. and MRI for 07-11-18, spoke with patient and he is aware of these appts.

## 2018-07-11 ENCOUNTER — Ambulatory Visit (HOSPITAL_COMMUNITY)
Admit: 2018-07-11 | Discharge: 2018-07-11 | Disposition: A | Discharge status: Home / Self Care | Payer: Medicare Other | Attending: Urology | Admitting: Urology

## 2018-07-11 ENCOUNTER — Encounter: Payer: Self-pay | Admitting: Medical Oncology

## 2018-07-11 ENCOUNTER — Ambulatory Visit
Admission: RE | Admit: 2018-07-11 | Discharge: 2018-07-11 | Disposition: A | Discharge status: Home / Self Care | Payer: Medicare Other | Source: Ambulatory Visit | Attending: Radiation Oncology | Admitting: Radiation Oncology

## 2018-07-11 ENCOUNTER — Encounter: Payer: Self-pay | Admitting: Radiation Oncology

## 2018-07-11 ENCOUNTER — Ambulatory Visit (HOSPITAL_COMMUNITY): Payer: Medicare Other

## 2018-07-11 ENCOUNTER — Other Ambulatory Visit: Payer: Self-pay

## 2018-07-11 DIAGNOSIS — C61 Malignant neoplasm of prostate: Secondary | ICD-10-CM

## 2018-07-11 DIAGNOSIS — Z79899 Other long term (current) drug therapy: Secondary | ICD-10-CM | POA: Diagnosis not present

## 2018-07-11 DIAGNOSIS — Z923 Personal history of irradiation: Secondary | ICD-10-CM | POA: Diagnosis not present

## 2018-07-11 NOTE — Progress Notes (Signed)
  Radiation Oncology         [phone] ________________________________  Name: Javed Cotto MRN: [id number]  Date: 07/11/2018  DOB: [birth year]  COMPLEX SIMULATION NOTE  NARRATIVE:  The patient was brought to the [facility] suite today following prostate seed implantation approximately one month ago.  Identity was confirmed.  All relevant records and images related to the planned course of therapy were reviewed.  Then, the patient was set-up supine.  CT images were obtained.  The CT images were loaded into the planning software.  Then the prostate and rectum were contoured.  Treatment planning then occurred.  The implanted iodine 125 seeds were identified by the physics staff for projection of radiation distribution  I have requested : 3D Simulation  I have requested a DVH of the following structures: Prostate and rectum.    ________________________________  Sheral Apley Tammi Klippel, M.D.  This document serves as a record of services personally performed by Tyler Pita, MD. It was created on his behalf by Rae Lips, a trained medical scribe. The creation of this record is based on the scribe's personal observations and the provider's statements to them. This document has been checked and approved by the attending provider.

## 2018-07-11 NOTE — Progress Notes (Signed)
Radiation Oncology         [phone] ________________________________  Name: Travis Singh MRN: [id number]  Date: 07/11/2018  DOB: 14-Jul-1946  Post-Seed Follow-Up Visit Note  CC: Travis [name], MD  Travis [name], MD  Diagnosis:   72 y.o. 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  Interval Since Last Radiation:  3 weeks - Insertion of radioactive I-125 seeds into the prostate gland, 145 Gy definitive therapy, with SpaceOAR  Narrative:  The patient returns today for routine follow-up.  He is complaining of increased urinary frequency and urinary hesitation symptoms. He filled out a questionnaire regarding urinary function today providing an overall IPSS score of 10 characterizing his symptoms as mild to moderate with residual increased urgency and mildly weakened stream but feels that he empties his bladder well on voiding in general.  His pre-implant score was 6. He specifically denies dysuria, gross hematuria, excessive daytime frequency, intermittency, hesitancy, straining to void or incontinence.  He reports a healthy appetite and is maintaining his weight.  He denies abdominal pain, nausea, vomiting, diarrhea or constipation.  Overall, he is quite pleased with his progress to date.  ALLERGIES:  has No Known Allergies.  Meds: Current Outpatient Medications  Medication Sig Dispense Refill  . atorvastatin (LIPITOR) 10 MG tablet Take 1 tablet (10 mg total) by mouth daily. (Patient taking differently: Take 10 mg by mouth every morning. ) 30 tablet 0  . colchicine 0.6 MG tablet TAKE 1 TABLET BY MOUTH ONCE DAILY 90 tablet 3  . cyclobenzaprine (FLEXERIL) 10 MG tablet TAKE 1 TABLET BY MOUTH THREE TIMES DAILY AS NEEDED (Patient taking differently: Take 10 mg by mouth 3 (three) times daily as needed. ) 90 tablet 0  . hydrochlorothiazide (HYDRODIURIL) 25 MG tablet TAKE 1 TABLET BY MOUTH ONCE DAILY (Patient  taking differently: Take 25 mg by mouth every morning. ) 90 tablet 3  . lisinopril (PRINIVIL,ZESTRIL) 40 MG tablet TAKE 1 TABLET BY MOUTH ONCE DAILY (Patient taking differently: Take 40 mg by mouth every morning. ) 90 tablet 3  . traMADol (ULTRAM) 50 MG tablet Take 1 tablet (50 mg total) by mouth every 6 (six) hours as needed. 8 tablet 0  . verapamil (CALAN-SR) 240 MG CR tablet TAKE 1 TABLET BY MOUTH ONCE DAILY (Patient taking differently: Take 240 mg by mouth every morning. ) 90 tablet 3   No current facility-administered medications for this encounter.     Physical Findings: In general this is a well appearing African-American male in no acute distress. He's alert and oriented x4 and appropriate throughout the examination. Cardiopulmonary assessment is negative for acute distress and he exhibits normal effort.   Lab Findings: Lab Results  Component Value Date   WBC 6.0 06/13/2018   HGB 13.8 06/13/2018   HCT 42.0 06/13/2018   MCV 85.5 06/13/2018   PLT 236 06/13/2018    Radiographic Findings:  Patient underwent CT imaging in our clinic for post implant dosimetry. The CT was reviewed by Dr. Tammi Klippel and appears to demonstrate an adequate distribution of radioactive seeds throughout the prostate gland. There are no seeds in or near the rectum.  He is scheduled for an MRI of the prostate at 3 PM this afternoon and those images will be images for further evaluation.  We suspect the final radiation plan and dosimetry will show appropriate coverage of the prostate gland.   Impression/Plan: The patient is recovering from  the effects of radiation. His urinary symptoms should gradually improve over the next 4-6 months. We talked about this today. He is encouraged by his improvement already and is otherwise pleased with his outcome. We also talked about long-term follow-up for prostate cancer following seed implant. He understands that ongoing PSA determinations and digital rectal exams will help  perform surveillance to rule out disease recurrence. He has a follow up appointment scheduled with Jiles Crocker, NP at 2 PM this afternoon and anticipates a follow-up with Dr. Leana Roe sometime in early January 2020. He understands what to expect with his PSA measures. Patient was also educated today about some of the long-term effects from radiation including a small risk for rectal bleeding and possibly erectile dysfunction. We talked about some of the general management approaches to these potential complications. However, I did encourage the patient to contact our office or return at any point if he has questions or concerns related to his previous radiation and prostate cancer.    [name], PA-C    Tyler Pita, MD  [facility] Direct Dial: [phone]  Fax: [phone] [email or web address]  Skype  LinkedIn    Page Me   This document serves as a record of services personally performed by Tyler Pita, MD and Freeman Caldron, PA-C. It was created on their behalf by Rae Lips, a trained medical scribe. The creation of this record is based on the scribe's personal observations and the providers' statements to them. This document has been checked and approved by the attending providers.

## 2018-07-11 NOTE — Progress Notes (Signed)
Received patient and his wife in the clinic for post seed follow up appointment. Weight and vitals stable. Denies pain. Post seed IPSS 10. Denies dysuria, hematuria, urinary leakage or incontinence. Denies bowel complaints. Scheduled to see urologist at 1345 today. Also, scheduled for MRI to confirm SpaceOar placement today at 1430 for 1500 scan.    BP (!) 144/80 (BP Location: Right Arm, Patient Position: Sitting)   Pulse 63   Temp 98.1 F (36.7 C) (Oral)   Resp 20   Ht 5\' 8"  (1.727 m)   Wt 207 lb 3.2 oz (94 kg)   SpO2 100%   BMI 31.50 kg/m  Wt Readings from Last 3 Encounters:  07/11/18 207 lb 3.2 oz (94 kg)  06/20/18 209 lb 8 oz (95 kg)  03/13/18 207 lb 9.6 oz (94.2 kg)

## 2018-07-15 ENCOUNTER — Telehealth: Payer: Self-pay | Admitting: Family Medicine

## 2018-07-15 NOTE — Telephone Encounter (Signed)
Pharmacy sent fax stating that pt does not have insurance for prescriptions and needs something cheaper then colchicine as it is 70$?  Mitigare is 116$

## 2018-07-16 NOTE — Telephone Encounter (Signed)
His only other option for an acute flare would be indomethacin 50 mg p.o. twice daily for 3-4 days.  This is potentially toxic to his kidney and he has chronic kidney disease.  Therefore he would want to use it sparingly and drink plenty of water while doing so.  You would not take this every day.  If he is not on allopurinol or Uloric I will be glad to see him to discuss preventative strategies

## 2018-07-22 NOTE — Telephone Encounter (Signed)
Call placed to patient and patient made aware.   States that he would prefer to continue to pay for Colchicine.

## 2018-07-25 ENCOUNTER — Encounter: Payer: Self-pay | Admitting: Radiation Oncology

## 2018-07-25 DIAGNOSIS — C61 Malignant neoplasm of prostate: Secondary | ICD-10-CM | POA: Diagnosis not present

## 2018-08-01 ENCOUNTER — Ambulatory Visit (INDEPENDENT_AMBULATORY_CARE_PROVIDER_SITE_OTHER): Payer: Medicare Other

## 2018-08-01 DIAGNOSIS — Z23 Encounter for immunization: Secondary | ICD-10-CM | POA: Diagnosis not present

## 2018-08-01 NOTE — Progress Notes (Signed)
Patient was in office for high doe flu vaccine. Patient received the vaccine in his left deltoid patient tolerated well.

## 2018-08-04 NOTE — Progress Notes (Signed)
  Radiation Oncology         ([phone] ________________________________  Name: Fadi Menter MRN: [id number]  Date: 07/25/2018  DOB: Dec 07, 1945  3D Planning Note   Prostate Brachytherapy Post-Implant Dosimetry  Diagnosis: 72 y.o. gentleman with Stage T1c adenocarcinoma of the prostate with Gleason Score of 3+4, and PSA of 7.08.  Narrative: On a previous date, Travis Singh returned following prostate seed implantation for post implant planning. He underwent CT scan complex simulation to delineate the three-dimensional structures of the pelvis and demonstrate the radiation distribution.  Since that time, the seed localization, and complex isodose planning with dose volume histograms have now been completed.  Results:   Prostate Coverage - The dose of radiation delivered to the 90% or more of the prostate gland (D90) was 128.16% of the prescription dose. This exceeds our goal of greater than 90%. Rectal Sparing - The volume of rectal tissue receiving the prescription dose or higher was 0.17 cc. This falls under our thresholds tolerance of 1.0 cc.  Impression: The prostate seed implant appears to show adequate target coverage and appropriate rectal sparing.  Plan:  The patient will continue to follow with urology for ongoing PSA determinations. I would anticipate a high likelihood for local tumor control with minimal risk for rectal morbidity.  ________________________________  Sheral Apley Tammi Klippel, M.D.

## 2018-09-07 ENCOUNTER — Other Ambulatory Visit: Payer: Self-pay | Admitting: Family Medicine

## 2018-09-09 NOTE — Telephone Encounter (Signed)
Requesting refill Flexeril    LOV: 08/09/17  LRF:  11/13/17

## 2018-10-02 DIAGNOSIS — C61 Malignant neoplasm of prostate: Secondary | ICD-10-CM | POA: Diagnosis not present

## 2018-10-09 DIAGNOSIS — R351 Nocturia: Secondary | ICD-10-CM | POA: Diagnosis not present

## 2018-10-09 DIAGNOSIS — Z8546 Personal history of malignant neoplasm of prostate: Secondary | ICD-10-CM | POA: Diagnosis not present

## 2018-11-18 ENCOUNTER — Other Ambulatory Visit: Payer: Self-pay | Admitting: Family Medicine

## 2018-11-18 NOTE — Telephone Encounter (Signed)
Ok to refill 

## 2018-12-12 ENCOUNTER — Other Ambulatory Visit: Payer: Self-pay | Admitting: Family Medicine

## 2018-12-12 DIAGNOSIS — I10 Essential (primary) hypertension: Secondary | ICD-10-CM

## 2019-01-10 ENCOUNTER — Other Ambulatory Visit: Payer: Self-pay | Admitting: Family Medicine

## 2019-01-10 DIAGNOSIS — I10 Essential (primary) hypertension: Secondary | ICD-10-CM

## 2019-02-05 ENCOUNTER — Other Ambulatory Visit: Payer: Self-pay | Admitting: Family Medicine

## 2019-02-05 NOTE — Telephone Encounter (Signed)
Ok to refill 

## 2019-02-11 ENCOUNTER — Other Ambulatory Visit: Payer: Self-pay | Admitting: Family Medicine

## 2019-02-11 DIAGNOSIS — I10 Essential (primary) hypertension: Secondary | ICD-10-CM

## 2019-03-10 ENCOUNTER — Other Ambulatory Visit: Payer: Self-pay | Admitting: Family Medicine

## 2019-03-10 DIAGNOSIS — I10 Essential (primary) hypertension: Secondary | ICD-10-CM

## 2019-04-11 DIAGNOSIS — Z8546 Personal history of malignant neoplasm of prostate: Secondary | ICD-10-CM | POA: Diagnosis not present

## 2019-04-12 ENCOUNTER — Other Ambulatory Visit: Payer: Self-pay | Admitting: Family Medicine

## 2019-04-12 DIAGNOSIS — I10 Essential (primary) hypertension: Secondary | ICD-10-CM

## 2019-04-18 DIAGNOSIS — Z8546 Personal history of malignant neoplasm of prostate: Secondary | ICD-10-CM | POA: Diagnosis not present

## 2019-04-18 DIAGNOSIS — R351 Nocturia: Secondary | ICD-10-CM | POA: Diagnosis not present

## 2019-04-25 ENCOUNTER — Other Ambulatory Visit: Payer: Self-pay | Admitting: Family Medicine

## 2019-04-25 NOTE — Telephone Encounter (Signed)
Ok to refill 

## 2019-04-26 ENCOUNTER — Other Ambulatory Visit: Payer: Self-pay | Admitting: Family Medicine

## 2019-04-26 DIAGNOSIS — I10 Essential (primary) hypertension: Secondary | ICD-10-CM

## 2019-05-14 ENCOUNTER — Other Ambulatory Visit: Payer: Self-pay

## 2019-05-15 ENCOUNTER — Ambulatory Visit (INDEPENDENT_AMBULATORY_CARE_PROVIDER_SITE_OTHER): Payer: Medicare Other | Admitting: Family Medicine

## 2019-05-15 ENCOUNTER — Encounter: Payer: Self-pay | Admitting: Family Medicine

## 2019-05-15 VITALS — BP 154/98 | HR 62 | Temp 98.0°F | Resp 18 | Ht 68.5 in | Wt 206.0 lb

## 2019-05-15 DIAGNOSIS — E049 Nontoxic goiter, unspecified: Secondary | ICD-10-CM | POA: Diagnosis not present

## 2019-05-15 DIAGNOSIS — C61 Malignant neoplasm of prostate: Secondary | ICD-10-CM

## 2019-05-15 DIAGNOSIS — R0989 Other specified symptoms and signs involving the circulatory and respiratory systems: Secondary | ICD-10-CM | POA: Diagnosis not present

## 2019-05-15 DIAGNOSIS — Z0001 Encounter for general adult medical examination with abnormal findings: Secondary | ICD-10-CM | POA: Diagnosis not present

## 2019-05-15 DIAGNOSIS — Z23 Encounter for immunization: Secondary | ICD-10-CM

## 2019-05-15 DIAGNOSIS — Z8739 Personal history of other diseases of the musculoskeletal system and connective tissue: Secondary | ICD-10-CM

## 2019-05-15 DIAGNOSIS — Z0000 Encounter for general adult medical examination without abnormal findings: Secondary | ICD-10-CM

## 2019-05-15 DIAGNOSIS — I10 Essential (primary) hypertension: Secondary | ICD-10-CM | POA: Diagnosis not present

## 2019-05-15 MED ORDER — LISINOPRIL 40 MG PO TABS: 40.0000 mg | ORAL_TABLET | Freq: Every day | ORAL | 3 refills | Status: DC

## 2019-05-15 NOTE — Progress Notes (Signed)
Subjective:    Patient ID: Travis Singh, male    DOB: [birth year], 73 y.o.   MRN: [id number]  HPI  Patient has not been seen in some time.  He is here today for complete physical exam.  His blood pressure is elevated however he has not been taking his lisinopril or his atorvastatin.  He has been taking verapamil and hydrochlorothiazide.  He denies any chest pain shortness of breath or dyspnea on exertion.  His last colonoscopy was in 2009.  This is due now.  The patient has prostate cancer and recently underwent radioactive seed placement by Dr. Jeffie Pollock.  His urologist is checking his PSA and monitoring this.  His immunization records are listed below: Immunization History  Administered Date(s) Administered  . Fluad Quad(high Dose 65+) 05/15/2019  . Influenza, High Dose Seasonal PF 07/04/2017, 08/01/2018  . Pneumococcal Conjugate-13 02/12/2015  . Pneumococcal Polysaccharide-23 01/20/2011   Patient received his flu shot today.  The only other vaccine he is due for would be the shingles vaccine.  He denies any issues with falls, depression, or memory loss.  He is still working part-time in [employer]. Past Medical History:  Diagnosis Date  . Bulging lumbar disc    multiple levels  . ED (erectile dysfunction)   . Gout   . History of adenomatous polyp of colon   . Hyperlipidemia   . Hypertension   . Left sciatic nerve pain   . Lumbar stenosis   . Nocturia   . Pre-diabetes   . Prostate cancer [facility]) urologist-  dr wrenn/ oncologist-  dr Tammi Klippel   dx 01-30-2017 w/ Gleason 3+3, PSA 4.59, active survillance/  biopsy 02-06-2018 , Stage T1c, Gleason 3+4, PSA 7.08-- scheduled for radioactive seed implants  . Wears glasses    Past Surgical History:  Procedure Laterality Date  . CYSTOSCOPY  06/20/2018   Procedure: CYSTOSCOPY FLEXIBLE;  Surgeon: Irine Seal, MD;  Location: [facility];  Service: Urology;;  NO SEEDS FOUND IN BLADDER  . NO PAST SURGERIES    . PROSTATE BIOPSY   01-30-2017;  02-06-2018  dr Jeffie Pollock office  . RADIOACTIVE SEED IMPLANT N/A 06/20/2018   Procedure: RADIOACTIVE SEED IMPLANT/BRACHYTHERAPY IMPLANT;  Surgeon: Irine Seal, MD;  Location: [facility];  Service: Urology;  Laterality: N/A;   86   SEEDS IMPLANTED  . SPACE OAR INSTILLATION N/A 06/20/2018   Procedure: SPACE OAR INSTILLATION;  Surgeon: Irine Seal, MD;  Location: [facility];  Service: Urology;  Laterality: N/A;   Current Outpatient Medications on File Prior to Visit  Medication Sig Dispense Refill  . atorvastatin (LIPITOR) 10 MG tablet Take 1 tablet (10 mg total) by mouth daily. (Patient taking differently: Take 10 mg by mouth every morning. ) 30 tablet 0  . colchicine 0.6 MG tablet TAKE 1 TABLET BY MOUTH ONCE DAILY 90 tablet 3  . cyclobenzaprine (FLEXERIL) 10 MG tablet TAKE 1 TABLET BY MOUTH THREE TIMES DAILY AS NEEDED -  NEEDS  OFFICE  VISIT  AND  LABS  BEFORE  FURTHER  REFILLS 90 tablet 0  . hydrochlorothiazide (HYDRODIURIL) 25 MG tablet Take 1 tablet (25 mg total) by mouth daily. 30 tablet 0  . traMADol (ULTRAM) 50 MG tablet Take 1 tablet (50 mg total) by mouth every 6 (six) hours as needed. 8 tablet 0  . verapamil (CALAN-SR) 240 MG CR tablet TAKE 1 TABLET BY MOUTH ONCE DAILY (Patient taking differently: Take 240 mg by mouth every morning. ) 90 tablet  3   No current facility-administered medications on file prior to visit.    No Known Allergies Social History   Socioeconomic History  . Marital status: Married    Spouse name: Not on file  . Number of children: Not on file  . Years of education: Not on file  . Highest education level: Not on file  Occupational History  . Occupation: Nature conservation officer  Social Needs  . Financial resource strain: Not on file  . Food insecurity    Worry: Not on file    Inability: Not on file  . Transportation needs    Medical: Not on file    Non-medical: Not on file  Tobacco Use  . Smoking status: Current Some  Day Smoker    Types: Cigars  . Smokeless tobacco: Never Used  . Tobacco comment: 06-13-2018  per pt every once in a while , cigar  Substance and Sexual Activity  . Alcohol use: Not Currently    Alcohol/week: 7.0 standard drinks    Types: 7 Cans of beer per week  . Drug use: No  . Sexual activity: Not Currently  Lifestyle  . Physical activity    Days per week: Not on file    Minutes per session: Not on file  . Stress: Not on file  Relationships  . Social Herbalist on phone: Not on file    Gets together: Not on file    Attends religious service: Not on file    Active member of club or organization: Not on file    Attends meetings of clubs or organizations: Not on file    Relationship status: Not on file  . Intimate partner violence    Fear of current or ex partner: Not on file    Emotionally abused: Not on file    Physically abused: Not on file    Forced sexual activity: Not on file  Other Topics Concern  . 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    Family History  Problem Relation Age of Onset  . Pancreatic cancer Mother   . Leukemia Father   . Lung cancer Brother   . Lung cancer Brother   . Lung cancer Brother   '    Review of Systems  All other systems reviewed and are negative.      Objective:   Physical Exam Constitutional:      General: He is not in acute distress.    Appearance: Normal appearance. He is obese. He is not ill-appearing, toxic-appearing or diaphoretic.  HENT:     Head: Normocephalic and atraumatic.     Right Ear: Tympanic membrane, ear canal and external ear normal.     Left Ear: Tympanic membrane, ear canal and external ear normal.     Nose: Nose normal. No congestion or rhinorrhea.     Mouth/Throat:     Mouth: Mucous membranes are moist.     Pharynx: Oropharynx is clear. No oropharyngeal exudate.  Eyes:     General: No scleral icterus.       Right eye: No  discharge.        Left eye: No discharge.     Extraocular Movements: Extraocular movements intact.     Conjunctiva/sclera: Conjunctivae normal.     Pupils: Pupils are equal, round, and reactive to light.  Neck:     Musculoskeletal: Neck supple. No muscular tenderness.  Thyroid: Thyromegaly present.     Vascular: Carotid bruit present.  Cardiovascular:     Rate and Rhythm: Normal rate and regular rhythm.     Pulses: Normal pulses.     Heart sounds: Normal heart sounds. No murmur. No friction rub. No gallop.   Pulmonary:     Effort: Pulmonary effort is normal. No respiratory distress.     Breath sounds: Normal breath sounds. No stridor. No wheezing, rhonchi or rales.  Chest:     Chest wall: No tenderness.  Abdominal:     General: Bowel sounds are normal. There is no distension.     Palpations: Abdomen is soft. There is no mass.     Tenderness: There is no abdominal tenderness. There is no right CVA tenderness, left CVA tenderness, guarding or rebound.     Hernia: No hernia is present.  Musculoskeletal: Normal range of motion.     Right lower leg: No edema.     Left lower leg: No edema.  Skin:    General: Skin is warm.     Coloration: Skin is not jaundiced or pale.     Findings: No bruising, erythema, lesion or rash.  Neurological:     General: No focal deficit present.     Mental Status: He is alert and oriented to person, place, and time.     Cranial Nerves: No cranial nerve deficit.     Sensory: No sensory deficit.     Motor: No weakness.     Coordination: Coordination normal.     Gait: Gait normal.     Deep Tendon Reflexes: Reflexes normal.  Psychiatric:        Mood and Affect: Mood normal.        Behavior: Behavior normal.        Thought Content: Thought content normal.        Judgment: Judgment normal.           Assessment & Plan:  The primary encounter diagnosis was Benign essential HTN. Diagnoses of History of gout, Needs flu shot, Prostate cancer ([place]),  General medical exam, Goiter, and Left carotid bruit were also pertinent to this visit. Blood pressure is elevated today.  Resume lisinopril 40 mg a day and recheck blood pressure in 1 month.  Check CBC, CMP, fasting lipid panel.  If LDL cholesterol is elevated I would recommend resuming Lipitor.  Goal LDL cholesterol is less than 100.  Patient received his flu shot today.  I did recommend the shingles vaccine.  His physical exam is significant for a bruit.  I will obtain carotid Dopplers to evaluate this further.  I recommended an aspirin 81 mg daily until carotid Dopplers are completed.  He also has a goiter on exam today.  Therefore I will obtain a thyroid ultrasound to evaluate further.  He denies any issues with falls, depression, or memory loss.  His PSA is monitored by his urologist.  We will defer colonoscopy until we have evaluated the bruit and thyromegaly first.

## 2019-05-16 ENCOUNTER — Other Ambulatory Visit: Payer: Medicare Other

## 2019-05-16 ENCOUNTER — Other Ambulatory Visit: Payer: Self-pay

## 2019-05-16 DIAGNOSIS — Z8739 Personal history of other diseases of the musculoskeletal system and connective tissue: Secondary | ICD-10-CM | POA: Diagnosis not present

## 2019-05-16 DIAGNOSIS — R7309 Other abnormal glucose: Secondary | ICD-10-CM | POA: Diagnosis not present

## 2019-05-16 DIAGNOSIS — I10 Essential (primary) hypertension: Secondary | ICD-10-CM | POA: Diagnosis not present

## 2019-05-21 ENCOUNTER — Other Ambulatory Visit: Payer: Self-pay | Admitting: Family Medicine

## 2019-05-22 ENCOUNTER — Ambulatory Visit
Admission: RE | Admit: 2019-05-22 | Discharge: 2019-05-22 | Disposition: A | Discharge status: Home / Self Care | Payer: Medicare Other | Source: Ambulatory Visit | Attending: Family Medicine | Admitting: Family Medicine

## 2019-05-22 ENCOUNTER — Encounter: Payer: Self-pay | Admitting: Family Medicine

## 2019-05-22 DIAGNOSIS — E042 Nontoxic multinodular goiter: Secondary | ICD-10-CM | POA: Diagnosis not present

## 2019-05-22 DIAGNOSIS — I6522 Occlusion and stenosis of left carotid artery: Secondary | ICD-10-CM | POA: Insufficient documentation

## 2019-05-22 DIAGNOSIS — E079 Disorder of thyroid, unspecified: Secondary | ICD-10-CM | POA: Insufficient documentation

## 2019-05-22 DIAGNOSIS — R0989 Other specified symptoms and signs involving the circulatory and respiratory systems: Secondary | ICD-10-CM

## 2019-05-22 DIAGNOSIS — E049 Nontoxic goiter, unspecified: Secondary | ICD-10-CM

## 2019-05-22 DIAGNOSIS — I6523 Occlusion and stenosis of bilateral carotid arteries: Secondary | ICD-10-CM | POA: Diagnosis not present

## 2019-05-23 ENCOUNTER — Other Ambulatory Visit: Payer: Self-pay | Admitting: Family Medicine

## 2019-05-23 LAB — LIPID PANEL
Cholesterol: 250 mg/dL — ABNORMAL HIGH (ref <200)
HDL: 35 mg/dL — ABNORMAL LOW (ref >=40)
LDL Cholesterol (Calc): 176 mg/dL (calc) — ABNORMAL HIGH
Non-HDL Cholesterol (Calc): 215 mg/dL (calc) — ABNORMAL HIGH (ref <130)
Total CHOL/HDL Ratio: 7.1 (calc) — ABNORMAL HIGH (ref <5.0)
Triglycerides: 228 mg/dL — ABNORMAL HIGH (ref <150)

## 2019-05-23 LAB — CBC WITH DIFFERENTIAL/PLATELET
Absolute Monocytes: 689 cells/uL (ref 200–950)
Basophils Absolute: 10 cells/uL (ref 0–200)
Basophils Relative: 0.2 %
Eosinophils Absolute: 71 cells/uL (ref 15–500)
Eosinophils Relative: 1.4 %
HCT: 43.7 % (ref 38.5–50.0)
Hemoglobin: 14.5 g/dL (ref 13.2–17.1)
Lymphs Abs: 1102 cells/uL (ref 850–3900)
MCH: 28.3 pg (ref 27.0–33.0)
MCHC: 33.2 g/dL (ref 32.0–36.0)
MCV: 85.2 fL (ref 80.0–100.0)
MPV: 10.8 fL (ref 7.5–12.5)
Monocytes Relative: 13.5 %
Neutro Abs: 3228 cells/uL (ref 1500–7800)
Neutrophils Relative %: 63.3 %
Platelets: 190 10*3/uL (ref 140–400)
RBC: 5.13 10*6/uL (ref 4.20–5.80)
RDW: 13.9 % (ref 11.0–15.0)
Total Lymphocyte: 21.6 %
WBC: 5.1 10*3/uL (ref 3.8–10.8)

## 2019-05-23 LAB — COMPLETE METABOLIC PANEL WITH GFR
AG Ratio: 1.5 (calc) (ref 1.0–2.5)
ALT: 25 U/L (ref 9–46)
AST: 22 U/L (ref 10–35)
Albumin: 4 g/dL (ref 3.6–5.1)
Alkaline phosphatase (APISO): 55 U/L (ref 35–144)
BUN/Creatinine Ratio: 17 (calc) (ref 6–22)
BUN: 24 mg/dL (ref 7–25)
CO2: 21 mmol/L (ref 20–32)
Calcium: 9.1 mg/dL (ref 8.6–10.3)
Chloride: 106 mmol/L (ref 98–110)
Creat: 1.38 mg/dL — ABNORMAL HIGH (ref 0.70–1.18)
GFR, Est African American: 58 mL/min/{1.73_m2} — ABNORMAL LOW (ref >=60)
GFR, Est Non African American: 50 mL/min/{1.73_m2} — ABNORMAL LOW (ref >=60)
Globulin: 2.6 g/dL (calc) (ref 1.9–3.7)
Glucose, Bld: 133 mg/dL — ABNORMAL HIGH (ref 65–99)
Potassium: 4.1 mmol/L (ref 3.5–5.3)
Sodium: 139 mmol/L (ref 135–146)
Total Bilirubin: 0.5 mg/dL (ref 0.2–1.2)
Total Protein: 6.6 g/dL (ref 6.1–8.1)

## 2019-05-23 LAB — HEMOGLOBIN A1C
Hgb A1c MFr Bld: 6.6 % of total Hgb — ABNORMAL HIGH (ref <5.7)
Mean Plasma Glucose: 143 (calc)
eAG (mmol/L): 7.9 (calc)

## 2019-05-23 LAB — TEST AUTHORIZATION 2

## 2019-05-23 LAB — URIC ACID: Uric Acid, Serum: 10.9 mg/dL — ABNORMAL HIGH (ref 4.0–8.0)

## 2019-05-26 ENCOUNTER — Telehealth: Payer: Self-pay

## 2019-05-26 DIAGNOSIS — E041 Nontoxic single thyroid nodule: Secondary | ICD-10-CM

## 2019-05-26 MED ORDER — ATORVASTATIN CALCIUM 40 MG PO TABS: 40.0000 mg | ORAL_TABLET | Freq: Every day | ORAL | 0 refills | Status: DC

## 2019-05-26 MED ORDER — ALLOPURINOL 100 MG PO TABS: 100.0000 mg | ORAL_TABLET | Freq: Every day | ORAL | 0 refills | Status: DC

## 2019-05-27 ENCOUNTER — Other Ambulatory Visit: Payer: Self-pay | Admitting: Family Medicine

## 2019-05-27 DIAGNOSIS — E041 Nontoxic single thyroid nodule: Secondary | ICD-10-CM

## 2019-05-27 NOTE — Telephone Encounter (Signed)
Error

## 2019-05-28 ENCOUNTER — Other Ambulatory Visit: Payer: Medicare Other

## 2019-06-09 ENCOUNTER — Other Ambulatory Visit: Payer: Self-pay | Admitting: Family Medicine

## 2019-06-09 DIAGNOSIS — I10 Essential (primary) hypertension: Secondary | ICD-10-CM

## 2019-07-01 ENCOUNTER — Other Ambulatory Visit: Payer: Self-pay | Admitting: Family Medicine

## 2019-07-01 ENCOUNTER — Ambulatory Visit
Admission: RE | Admit: 2019-07-01 | Discharge: 2019-07-01 | Disposition: A | Discharge status: Home / Self Care | Payer: Medicare Other | Source: Ambulatory Visit | Attending: Family Medicine | Admitting: Family Medicine

## 2019-07-01 DIAGNOSIS — E041 Nontoxic single thyroid nodule: Secondary | ICD-10-CM

## 2019-07-01 DIAGNOSIS — E042 Nontoxic multinodular goiter: Secondary | ICD-10-CM | POA: Diagnosis not present

## 2019-07-12 ENCOUNTER — Other Ambulatory Visit: Payer: Self-pay | Admitting: Family Medicine

## 2019-08-22 ENCOUNTER — Other Ambulatory Visit: Payer: Self-pay | Admitting: Family Medicine

## 2019-08-25 ENCOUNTER — Other Ambulatory Visit: Payer: Medicare Other

## 2019-08-25 ENCOUNTER — Other Ambulatory Visit: Payer: Self-pay

## 2019-08-25 DIAGNOSIS — Z8739 Personal history of other diseases of the musculoskeletal system and connective tissue: Secondary | ICD-10-CM | POA: Diagnosis not present

## 2019-08-25 DIAGNOSIS — E7800 Pure hypercholesterolemia, unspecified: Secondary | ICD-10-CM | POA: Diagnosis not present

## 2019-08-26 LAB — LIPID PANEL
Cholesterol: 136 mg/dL (ref <200)
HDL: 40 mg/dL (ref >=40)
LDL Cholesterol (Calc): 76 mg/dL (calc)
Non-HDL Cholesterol (Calc): 96 mg/dL (calc) (ref <130)
Total CHOL/HDL Ratio: 3.4 (calc) (ref <5.0)
Triglycerides: 115 mg/dL (ref <150)

## 2019-08-26 LAB — URIC ACID: Uric Acid, Serum: 8.2 mg/dL — ABNORMAL HIGH (ref 4.0–8.0)

## 2019-08-27 ENCOUNTER — Other Ambulatory Visit: Payer: Self-pay | Admitting: Family Medicine

## 2019-08-27 MED ORDER — ALLOPURINOL 300 MG PO TABS: 300.0000 mg | ORAL_TABLET | Freq: Every day | ORAL | 3 refills | Status: DC

## 2019-10-09 DIAGNOSIS — Z8546 Personal history of malignant neoplasm of prostate: Secondary | ICD-10-CM | POA: Diagnosis not present

## 2019-10-16 DIAGNOSIS — R351 Nocturia: Secondary | ICD-10-CM | POA: Diagnosis not present

## 2019-10-16 DIAGNOSIS — Z8546 Personal history of malignant neoplasm of prostate: Secondary | ICD-10-CM | POA: Diagnosis not present

## 2019-10-16 DIAGNOSIS — N5201 Erectile dysfunction due to arterial insufficiency: Secondary | ICD-10-CM | POA: Diagnosis not present

## 2019-11-12 ENCOUNTER — Other Ambulatory Visit: Payer: Self-pay | Admitting: Family Medicine

## 2019-12-22 DIAGNOSIS — H40033 Anatomical narrow angle, bilateral: Secondary | ICD-10-CM | POA: Diagnosis not present

## 2019-12-22 DIAGNOSIS — H2513 Age-related nuclear cataract, bilateral: Secondary | ICD-10-CM | POA: Diagnosis not present

## 2020-02-24 ENCOUNTER — Other Ambulatory Visit: Payer: Self-pay | Admitting: Family Medicine

## 2020-04-12 ENCOUNTER — Other Ambulatory Visit: Payer: Self-pay | Admitting: Family Medicine

## 2020-04-12 DIAGNOSIS — Z8546 Personal history of malignant neoplasm of prostate: Secondary | ICD-10-CM | POA: Diagnosis not present

## 2020-04-19 DIAGNOSIS — R351 Nocturia: Secondary | ICD-10-CM | POA: Diagnosis not present

## 2020-04-19 DIAGNOSIS — C61 Malignant neoplasm of prostate: Secondary | ICD-10-CM | POA: Diagnosis not present

## 2020-05-10 ENCOUNTER — Other Ambulatory Visit: Payer: Self-pay | Admitting: Family Medicine

## 2020-05-14 ENCOUNTER — Other Ambulatory Visit: Payer: Self-pay | Admitting: Family Medicine

## 2020-05-17 ENCOUNTER — Other Ambulatory Visit: Payer: Self-pay | Admitting: Family Medicine

## 2020-05-19 ENCOUNTER — Telehealth: Payer: Self-pay | Admitting: Family Medicine

## 2020-05-19 ENCOUNTER — Other Ambulatory Visit: Payer: Self-pay

## 2020-05-19 MED ORDER — COLCHICINE 0.6 MG PO TABS: ORAL_TABLET | ORAL | 0 refills | Status: DC

## 2020-05-19 NOTE — Telephone Encounter (Signed)
Rx refill sent, Pt aware OV is need for further refills

## 2020-05-19 NOTE — Telephone Encounter (Signed)
CB# [phone] Colchicine 0.6mg   [id number] [place] [address]), [place]

## 2020-05-24 ENCOUNTER — Other Ambulatory Visit: Payer: Self-pay

## 2020-05-24 ENCOUNTER — Ambulatory Visit (INDEPENDENT_AMBULATORY_CARE_PROVIDER_SITE_OTHER): Payer: Medicare Other | Admitting: Family Medicine

## 2020-05-24 VITALS — BP 120/60 | HR 60 | Temp 96.3°F | Ht 68.0 in | Wt 208.0 lb

## 2020-05-24 DIAGNOSIS — E118 Type 2 diabetes mellitus with unspecified complications: Secondary | ICD-10-CM

## 2020-05-24 DIAGNOSIS — E041 Nontoxic single thyroid nodule: Secondary | ICD-10-CM

## 2020-05-24 DIAGNOSIS — I10 Essential (primary) hypertension: Secondary | ICD-10-CM | POA: Diagnosis not present

## 2020-05-24 DIAGNOSIS — I6522 Occlusion and stenosis of left carotid artery: Secondary | ICD-10-CM | POA: Diagnosis not present

## 2020-05-24 NOTE — Progress Notes (Signed)
Subjective:    Patient ID: Travis Singh, male    DOB: [birth year], 74 y.o.   MRN: [id number]  HPI  Patient is a very pleasant 74 year old African-American gentleman here today for a checkup.  Last year he was found to have a left-sided thyroid nodule.  He deferred a biopsy at that point so they recommended annual ultrasound surveillance.  I will schedule that for him today.  He also has a history of noncritical left-sided carotid artery stenosis less than 50%.  He is taking atorvastatin.  He is taking aspirin.  He denies any TIA or strokelike symptoms.  Blood pressure today is well controlled at 120/60.  He denies any chest pain shortness of breath or dyspnea on exertion.  He is also on allopurinol for gout.  He denies any recent gout exacerbations.  He denies any myalgias or right upper quadrant pain on atorvastatin.  He denies any rash on allopurinol.  He denies any cough on lisinopril.  Heart rate today is well controlled.  He has had his Covid vaccination. Past Medical History:  Diagnosis Date  . Bulging lumbar disc    multiple levels  . ED (erectile dysfunction)   . Gout   . History of adenomatous polyp of colon   . Hyperlipidemia   . Hypertension   . Left carotid artery stenosis <50%  . Left sciatic nerve pain   . Lumbar stenosis   . Nocturia   . Pre-diabetes   . Prostate cancer [facility]) urologist-  dr wrenn/ oncologist-  dr Tammi Klippel   dx 01-30-2017 w/ Gleason 3+3, PSA 4.59, active survillance/  biopsy 02-06-2018 , Stage T1c, Gleason 3+4, PSA 7.08-- scheduled for radioactive seed implants  . Thyroid mass    see [place] 8/20  . Wears glasses    Past Surgical History:  Procedure Laterality Date  . CYSTOSCOPY  06/20/2018   Procedure: CYSTOSCOPY FLEXIBLE;  Surgeon: Irine Seal, MD;  Location: [facility];  Service: Urology;;  NO SEEDS FOUND IN BLADDER  . NO PAST SURGERIES    . PROSTATE BIOPSY  01-30-2017;  02-06-2018  dr Jeffie Pollock office  . RADIOACTIVE SEED IMPLANT N/A  06/20/2018   Procedure: RADIOACTIVE SEED IMPLANT/BRACHYTHERAPY IMPLANT;  Surgeon: Irine Seal, MD;  Location: [facility];  Service: Urology;  Laterality: N/A;   86   SEEDS IMPLANTED  . SPACE OAR INSTILLATION N/A 06/20/2018   Procedure: SPACE OAR INSTILLATION;  Surgeon: Irine Seal, MD;  Location: [facility];  Service: Urology;  Laterality: N/A;   Current Outpatient Medications on File Prior to Visit  Medication Sig Dispense Refill  . allopurinol (ZYLOPRIM) 300 MG tablet Take 1 tablet (300 mg total) by mouth daily. 90 tablet 3  . atorvastatin (LIPITOR) 40 MG tablet Take 1 tablet by mouth once daily 90 tablet 0  . colchicine 0.6 MG tablet TAKE 1 TABLET BY MOUTH ONCE DAILY . APPOINTMENT REQUIRED FOR FUTURE REFILLS 30 tablet 0  . hydrochlorothiazide (HYDRODIURIL) 25 MG tablet Take 1 tablet by mouth once daily 90 tablet 3  . lisinopril (ZESTRIL) 40 MG tablet Take 1 tablet by mouth once daily 90 tablet 0  . verapamil (CALAN-SR) 240 MG CR tablet Take 1 tablet by mouth once daily 90 tablet 2  . cyclobenzaprine (FLEXERIL) 10 MG tablet TAKE 1 TABLET BY MOUTH THREE TIMES DAILY AS NEEDED -  NEEDS  OFFICE  VISIT  AND  LABS  BEFORE  FURTHER  REFILLS (Patient not taking: Reported on 05/24/2020) 90 tablet  0   No current facility-administered medications on file prior to visit.   No Known Allergies Social History   Socioeconomic History  . Marital status: Married    Spouse name: Not on file  . Number of children: Not on file  . Years of education: Not on file  . Highest education level: Not on file  Occupational History  . Occupation: Nature conservation officer  Tobacco Use  . Smoking status: Current Some Day Smoker    Types: Cigars  . Smokeless tobacco: Never Used  . Tobacco comment: 06-13-2018  per pt every once in a while , cigar  Vaping Use  . Vaping Use: Never used  Substance and Sexual Activity  . Alcohol use: Not Currently    Alcohol/week: 7.0 standard drinks     Types: 7 Cans of beer per week  . Drug use: No  . Sexual activity: Not Currently  Other Topics Concern  . 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    Social Determinants of Health   Financial Resource Strain:   . Difficulty of Paying Living Expenses: Not on file  Food Insecurity:   . Worried About Charity fundraiser in the Last Year: Not on file  . Ran Out of Food in the Last Year: Not on file  Transportation Needs:   . Lack of Transportation (Medical): Not on file  . Lack of Transportation (Non-Medical): Not on file  Physical Activity:   . Days of Exercise per Week: Not on file  . Minutes of Exercise per Session: Not on file  Stress:   . Feeling of Stress : Not on file  Social Connections:   . Frequency of Communication with Friends and Family: Not on file  . Frequency of Social Gatherings with Friends and Family: Not on file  . Attends Religious Services: Not on file  . Active Member of Clubs or Organizations: Not on file  . Attends Archivist Meetings: Not on file  . Marital Status: Not on file  Intimate Partner Violence:   . Fear of Current or Ex-Partner: Not on file  . Emotionally Abused: Not on file  . Physically Abused: Not on file  . Sexually Abused: Not on file   Family History  Problem Relation Age of Onset  . Pancreatic cancer Mother   . Leukemia Father   . Lung cancer Brother   . Lung cancer Brother   . Lung cancer Brother   '    Review of Systems  All other systems reviewed and are negative.      Objective:   Physical Exam Constitutional:      General: He is not in acute distress.    Appearance: Normal appearance. He is obese. He is not ill-appearing, toxic-appearing or diaphoretic.  HENT:     Head: Normocephalic and atraumatic.     Right Ear: Tympanic membrane, ear canal and external ear normal.     Left Ear: Tympanic membrane, ear canal and external ear normal.      Nose: Nose normal. No congestion or rhinorrhea.     Mouth/Throat:     Mouth: Mucous membranes are moist.     Pharynx: Oropharynx is clear. No oropharyngeal exudate.  Eyes:     General: No scleral icterus.       Right eye: No discharge.        Left eye: No discharge.     Extraocular Movements: Extraocular  movements intact.     Conjunctiva/sclera: Conjunctivae normal.     Pupils: Pupils are equal, round, and reactive to light.  Neck:     Thyroid: Thyromegaly present.     Vascular: Carotid bruit present.  Cardiovascular:     Rate and Rhythm: Normal rate and regular rhythm.     Pulses: Normal pulses.     Heart sounds: Normal heart sounds. No murmur heard.  No friction rub. No gallop.   Pulmonary:     Effort: Pulmonary effort is normal. No respiratory distress.     Breath sounds: Normal breath sounds. No stridor. No wheezing, rhonchi or rales.  Chest:     Chest wall: No tenderness.  Abdominal:     General: Bowel sounds are normal. There is no distension.     Palpations: Abdomen is soft. There is no mass.     Tenderness: There is no abdominal tenderness. There is no right CVA tenderness, left CVA tenderness, guarding or rebound.     Hernia: No hernia is present.  Musculoskeletal:        General: Normal range of motion.     Cervical back: Neck supple. No muscular tenderness.     Right lower leg: No edema.     Left lower leg: No edema.  Skin:    General: Skin is warm.     Coloration: Skin is not jaundiced or pale.     Findings: No bruising, erythema, lesion or rash.  Neurological:     General: No focal deficit present.     Mental Status: He is alert and oriented to person, place, and time.     Cranial Nerves: No cranial nerve deficit.     Sensory: No sensory deficit.     Motor: No weakness.     Coordination: Coordination normal.     Gait: Gait normal.     Deep Tendon Reflexes: Reflexes normal.  Psychiatric:        Mood and Affect: Mood normal.        Behavior: Behavior  normal.        Thought Content: Thought content normal.        Judgment: Judgment normal.           Assessment & Plan:  Benign essential HTN - Plan: CBC with Differential/Platelet, COMPLETE METABOLIC PANEL WITH GFR, Lipid panel  Left carotid artery stenosis - Plan: CBC with Differential/Platelet, COMPLETE METABOLIC PANEL WITH GFR, Lipid panel  Controlled type 2 diabetes mellitus with complication, without long-term current use of insulin (HCC) - Plan: Hemoglobin A1c  Left thyroid nodule - Plan: US THYROID  Repeat thyroid ultrasound to monitor his thyroid nodule to ensure that there is no growth.  If there are distinct changes I would recommend a fine-needle aspiration.  Patient has a history of less than 50% carotid artery stenosis on the left side.  Continue aspirin daily for primary prevention of stroke.  Check lipid panel.  Goal LDL cholesterol is less than 70.  Check CBC and CMP.  Blood pressure today is well controlled.  Check hemoglobin A1c to ensure that diabetes is well controlled.  Goal hemoglobin A1c is less than 6.5.  Covid vaccination is up-to-date.

## 2020-05-25 ENCOUNTER — Encounter: Payer: Self-pay | Admitting: Family Medicine

## 2020-05-25 DIAGNOSIS — E118 Type 2 diabetes mellitus with unspecified complications: Secondary | ICD-10-CM | POA: Insufficient documentation

## 2020-05-25 LAB — CBC WITH DIFFERENTIAL/PLATELET
Absolute Monocytes: 525 cells/uL (ref 200–950)
Basophils Absolute: 21 cells/uL (ref 0–200)
Basophils Relative: 0.5 %
Eosinophils Absolute: 59 cells/uL (ref 15–500)
Eosinophils Relative: 1.4 %
HCT: 43.1 % (ref 38.5–50.0)
Hemoglobin: 13.9 g/dL (ref 13.2–17.1)
Lymphs Abs: 1205 cells/uL (ref 850–3900)
MCH: 27.7 pg (ref 27.0–33.0)
MCHC: 32.3 g/dL (ref 32.0–36.0)
MCV: 86 fL (ref 80.0–100.0)
MPV: 11 fL (ref 7.5–12.5)
Monocytes Relative: 12.5 %
Neutro Abs: 2390 cells/uL (ref 1500–7800)
Neutrophils Relative %: 56.9 %
Platelets: 181 10*3/uL (ref 140–400)
RBC: 5.01 10*6/uL (ref 4.20–5.80)
RDW: 14.5 % (ref 11.0–15.0)
Total Lymphocyte: 28.7 %
WBC: 4.2 10*3/uL (ref 3.8–10.8)

## 2020-05-25 LAB — COMPLETE METABOLIC PANEL WITH GFR
AG Ratio: 2 (calc) (ref 1.0–2.5)
ALT: 21 U/L (ref 9–46)
AST: 18 U/L (ref 10–35)
Albumin: 4.3 g/dL (ref 3.6–5.1)
Alkaline phosphatase (APISO): 56 U/L (ref 35–144)
BUN/Creatinine Ratio: 20 (calc) (ref 6–22)
BUN: 29 mg/dL — ABNORMAL HIGH (ref 7–25)
CO2: 22 mmol/L (ref 20–32)
Calcium: 9.6 mg/dL (ref 8.6–10.3)
Chloride: 108 mmol/L (ref 98–110)
Creat: 1.44 mg/dL — ABNORMAL HIGH (ref 0.70–1.18)
GFR, Est African American: 54 mL/min/{1.73_m2} — ABNORMAL LOW (ref >=60)
GFR, Est Non African American: 47 mL/min/{1.73_m2} — ABNORMAL LOW (ref >=60)
Globulin: 2.2 g/dL (calc) (ref 1.9–3.7)
Glucose, Bld: 124 mg/dL — ABNORMAL HIGH (ref 65–99)
Potassium: 4.2 mmol/L (ref 3.5–5.3)
Sodium: 141 mmol/L (ref 135–146)
Total Bilirubin: 0.5 mg/dL (ref 0.2–1.2)
Total Protein: 6.5 g/dL (ref 6.1–8.1)

## 2020-05-25 LAB — LIPID PANEL
Cholesterol: 136 mg/dL (ref <200)
HDL: 40 mg/dL (ref >=40)
LDL Cholesterol (Calc): 77 mg/dL (calc)
Non-HDL Cholesterol (Calc): 96 mg/dL (calc) (ref <130)
Total CHOL/HDL Ratio: 3.4 (calc) (ref <5.0)
Triglycerides: 104 mg/dL (ref <150)

## 2020-05-25 LAB — HEMOGLOBIN A1C
Hgb A1c MFr Bld: 6.8 % of total Hgb — ABNORMAL HIGH (ref <5.7)
Mean Plasma Glucose: 148 (calc)
eAG (mmol/L): 8.2 (calc)

## 2020-05-26 ENCOUNTER — Other Ambulatory Visit: Payer: Self-pay | Admitting: Family Medicine

## 2020-06-04 ENCOUNTER — Other Ambulatory Visit: Payer: Self-pay | Admitting: Family Medicine

## 2020-06-04 DIAGNOSIS — I10 Essential (primary) hypertension: Secondary | ICD-10-CM

## 2020-06-07 ENCOUNTER — Other Ambulatory Visit: Payer: Self-pay

## 2020-06-07 ENCOUNTER — Ambulatory Visit (HOSPITAL_COMMUNITY)
Admission: RE | Admit: 2020-06-07 | Discharge: 2020-06-07 | Disposition: A | Discharge status: Home / Self Care | Payer: Medicare Other | Source: Ambulatory Visit | Attending: Family Medicine | Admitting: Family Medicine

## 2020-06-07 DIAGNOSIS — E049 Nontoxic goiter, unspecified: Secondary | ICD-10-CM | POA: Diagnosis not present

## 2020-06-07 DIAGNOSIS — E041 Nontoxic single thyroid nodule: Secondary | ICD-10-CM | POA: Insufficient documentation

## 2020-07-08 ENCOUNTER — Ambulatory Visit (INDEPENDENT_AMBULATORY_CARE_PROVIDER_SITE_OTHER): Payer: Medicare Other | Admitting: Family Medicine

## 2020-07-08 ENCOUNTER — Other Ambulatory Visit: Payer: Self-pay

## 2020-07-08 DIAGNOSIS — Z23 Encounter for immunization: Secondary | ICD-10-CM

## 2020-07-19 ENCOUNTER — Other Ambulatory Visit: Payer: Self-pay | Admitting: Family Medicine

## 2020-08-02 ENCOUNTER — Ambulatory Visit: Payer: Medicare Other | Attending: Internal Medicine

## 2020-08-02 DIAGNOSIS — Z23 Encounter for immunization: Secondary | ICD-10-CM

## 2020-08-02 NOTE — Progress Notes (Signed)
   Covid-19 Vaccination Clinic  Name:  Travis Singh    MRN: [id number] DOB: Oct 27, 1945  08/02/2020  Mr. Travis Singh was observed post Covid-19 immunization for 15 minutes without incident. He was provided with Vaccine Information Sheet and instruction to access the V-Safe system.   Mr. Travis Singh was instructed to call 911 with any severe reactions post vaccine: Marland Kitchen Difficulty breathing  . Swelling of face and throat  . A fast heartbeat  . A bad rash all over body  . Dizziness and weakness

## 2020-08-09 ENCOUNTER — Other Ambulatory Visit: Payer: Self-pay | Admitting: Family Medicine

## 2020-08-09 ENCOUNTER — Telehealth: Payer: Self-pay | Admitting: Family Medicine

## 2020-08-09 NOTE — Telephone Encounter (Signed)
Pt is scheduled for 08-13-20 with a follow up/ BP Medications

## 2020-08-09 NOTE — Telephone Encounter (Signed)
LVM to sch appt f/u bp meds

## 2020-08-10 ENCOUNTER — Other Ambulatory Visit: Payer: Self-pay | Admitting: Family Medicine

## 2020-08-13 ENCOUNTER — Ambulatory Visit (INDEPENDENT_AMBULATORY_CARE_PROVIDER_SITE_OTHER): Payer: Medicare Other | Admitting: Family Medicine

## 2020-08-13 ENCOUNTER — Other Ambulatory Visit: Payer: Self-pay

## 2020-08-13 VITALS — BP 140/80 | HR 66 | Temp 97.6°F | Ht 68.0 in | Wt 210.0 lb

## 2020-08-13 DIAGNOSIS — E118 Type 2 diabetes mellitus with unspecified complications: Secondary | ICD-10-CM

## 2020-08-13 DIAGNOSIS — I6522 Occlusion and stenosis of left carotid artery: Secondary | ICD-10-CM | POA: Diagnosis not present

## 2020-08-13 DIAGNOSIS — I10 Essential (primary) hypertension: Secondary | ICD-10-CM | POA: Diagnosis not present

## 2020-08-13 DIAGNOSIS — E049 Nontoxic goiter, unspecified: Secondary | ICD-10-CM | POA: Diagnosis not present

## 2020-08-13 NOTE — Progress Notes (Signed)
Subjective:    Patient ID: Travis Singh, male    DOB: October 07, 1945, 74 y.o.   MRN: [id number]  HPI  04/2020 Patient is a very pleasant 74 year old African-American gentleman here today for a checkup.  Last year he was found to have a left-sided thyroid nodule.  He deferred a biopsy at that point so they recommended annual ultrasound surveillance.  I will schedule that for him today.  He also has a history of noncritical left-sided carotid artery stenosis less than 50%.  He is taking atorvastatin.  He is taking aspirin.  He denies any TIA or strokelike symptoms.  Blood pressure today is well controlled at 120/60.  He denies any chest pain shortness of breath or dyspnea on exertion.  He is also on allopurinol for gout.  He denies any recent gout exacerbations.  He denies any myalgias or right upper quadrant pain on atorvastatin.  He denies any rash on allopurinol.  He denies any cough on lisinopril.  Heart rate today is well controlled.  He has had his Covid vaccination.  At that time, my plan was: Repeat thyroid ultrasound to monitor his thyroid nodule to ensure that there is no growth.  If there are distinct changes I would recommend a fine-needle aspiration.  Patient has a history of less than 50% carotid artery stenosis on the left side.  Continue aspirin daily for primary prevention of stroke.  Check lipid panel.  Goal LDL cholesterol is less than 70.  Check CBC and CMP.  Blood pressure today is well controlled.  Check hemoglobin A1c to ensure that diabetes is well controlled.  Goal hemoglobin A1c is less than 6.5.  Covid vaccination is up-to-date.  Thyroid US- IMPRESSION: Similar-appearing diffusely enlarged and heterogeneous thyroid gland without discrete nodule.  Office Visit on 05/24/2020  Component Date Value Ref Range Status  . WBC 05/24/2020 4.2  3.8 - 10.8 Thousand/uL Final  . RBC 05/24/2020 5.01  4.20 - 5.80 Million/uL Final  . Hemoglobin 05/24/2020 13.9  13.2 - 17.1 g/dL Final  .  HCT 05/24/2020 43.1  38 - 50 % Final  . MCV 05/24/2020 86.0  80.0 - 100.0 fL Final  . MCH 05/24/2020 27.7  27.0 - 33.0 pg Final  . MCHC 05/24/2020 32.3  32.0 - 36.0 g/dL Final  . RDW 05/24/2020 14.5  11.0 - 15.0 % Final  . Platelets 05/24/2020 181  140 - 400 Thousand/uL Final  . MPV 05/24/2020 11.0  7.5 - 12.5 fL Final  . Neutro Abs 05/24/2020 2,390  1,500 - 7,800 cells/uL Final  . Lymphs Abs 05/24/2020 1,205  850 - 3,900 cells/uL Final  . Absolute Monocytes 05/24/2020 525  200 - 950 cells/uL Final  . Eosinophils Absolute 05/24/2020 59  15.0 - 500.0 cells/uL Final  . Basophils Absolute 05/24/2020 21  0.0 - 200.0 cells/uL Final  . Neutrophils Relative % 05/24/2020 56.9  % Final  . Total Lymphocyte 05/24/2020 28.7  % Final  . Monocytes Relative 05/24/2020 12.5  % Final  . Eosinophils Relative 05/24/2020 1.4  % Final  . Basophils Relative 05/24/2020 0.5  % Final  . Glucose, Bld 05/24/2020 124* 65 - 99 mg/dL Final   Comment: .            Fasting reference interval . For someone without known diabetes, a glucose value between 100 and 125 mg/dL is consistent with prediabetes and should be confirmed with a follow-up test. .   . BUN 05/24/2020 29* 7 - 25 mg/dL Final  .  Creat 05/24/2020 1.44* 0.70 - 1.18 mg/dL Final   Comment: For patients >23 years of age, the reference limit for Creatinine is approximately 13% higher for people identified as African-American. .   . GFR, Est Non African American 05/24/2020 47* > OR = 60 mL/min/1.41m2 Final  . GFR, Est African American 05/24/2020 54* > OR = 60 mL/min/1.34m2 Final  . BUN/Creatinine Ratio 05/24/2020 20  6 - 22 (calc) Final  . Sodium 05/24/2020 141  135 - 146 mmol/L Final  . Potassium 05/24/2020 4.2  3.5 - 5.3 mmol/L Final  . Chloride 05/24/2020 108  98 - 110 mmol/L Final  . CO2 05/24/2020 22  20 - 32 mmol/L Final  . Calcium 05/24/2020 9.6  8.6 - 10.3 mg/dL Final  . Total Protein 05/24/2020 6.5  6.1 - 8.1 g/dL Final  . Albumin  05/24/2020 4.3  3.6 - 5.1 g/dL Final  . Globulin 05/24/2020 2.2  1.9 - 3.7 g/dL (calc) Final  . AG Ratio 05/24/2020 2.0  1.0 - 2.5 (calc) Final  . Total Bilirubin 05/24/2020 0.5  0.2 - 1.2 mg/dL Final  . Alkaline phosphatase (APISO) 05/24/2020 56  35 - 144 U/L Final  . AST 05/24/2020 18  10 - 35 U/L Final  . ALT 05/24/2020 21  9 - 46 U/L Final  . Cholesterol 05/24/2020 136  <200 mg/dL Final  . HDL 05/24/2020 40  > OR = 40 mg/dL Final  . Triglycerides 05/24/2020 104  <150 mg/dL Final  . LDL Cholesterol (Calc) 05/24/2020 77  mg/dL (calc) Final   Comment: Reference range: <100 . Desirable range <100 mg/dL for primary prevention;   <70 mg/dL for patients with CHD or diabetic patients  with > or = 2 CHD risk factors. Marland Kitchen LDL-C is now calculated using the Martin-Hopkins  calculation, which is a validated novel method providing  better accuracy than the Friedewald equation in the  estimation of LDL-C.  Cresenciano Genre et al. Annamaria Helling. 1245;809(98): 2061-2068  (http://education.[facility].com/faq/FAQ164)   . Total CHOL/HDL Ratio 05/24/2020 3.4  <5.0 (calc) Final  . Non-HDL Cholesterol (Calc) 05/24/2020 96  <130 mg/dL (calc) Final   Comment: For patients with diabetes plus 1 major ASCVD risk  factor, treating to a non-HDL-C goal of <100 mg/dL  (LDL-C of <70 mg/dL) is considered a therapeutic  option.   . Hgb A1c MFr Bld 05/24/2020 6.8* <5.7 % of total Hgb Final   Comment: For someone without known diabetes, a hemoglobin A1c value of 6.5% or greater indicates that they may have  diabetes and this should be confirmed with a follow-up  test. . For someone with known diabetes, a value <7% indicates  that their diabetes is well controlled and a value  greater than or equal to 7% indicates suboptimal  control. A1c targets should be individualized based on  duration of diabetes, age, comorbid conditions, and  other considerations. . Currently, no consensus exists regarding use of hemoglobin  A1c for diagnosis of diabetes for children. .   . Mean Plasma Glucose 05/24/2020 148  (calc) Final  . eAG (mmol/L) 05/24/2020 8.2  (calc) Final    08/13/20 Although they did not see a discrete mass on his thyroid ultrasound, the size of the thyroid had grown considerably since the previous ultrasound in 2020: Right lobe: 12.1 x 4.3 x 4.8 cm, previously 8.2 x 4.8 x 4.8 cm  Left lobe: 11.9 x 3.8 x 5.1 cm, previously 8.5 x 3.5 x 4.7 cm Patient denies any obstructive symptoms.  He denies  any difficulty breathing.  He denies any difficulty swallowing.  He denies any dysphagia.  However on today's exam, the patient has a markedly enlarged goiter and I am concerned that eventually he is going to develop obstructive symptoms.  He is not had a TSH in some time to determine if he is euthyroid.  Therefore this is due today along with a T4.  He is also due to recheck his blood sugar.  He is not checking his sugar.  At present he has diet-controlled diabetes although he denies any polyuria, polydipsia or blurry vision.  Blood pressure here and at home are well controlled. Past Medical History:  Diagnosis Date  . Bulging lumbar disc    multiple levels  . Diabetes mellitus type 2 with complications ([place])   . ED (erectile dysfunction)   . Gout   . History of adenomatous polyp of colon   . Hyperlipidemia   . Hypertension   . Left carotid artery stenosis <50%  . Left sciatic nerve pain   . Lumbar stenosis   . Nocturia   . Prostate cancer [facility]) urologist-  dr wrenn/ oncologist-  dr Tammi Klippel   dx 01-30-2017 w/ Gleason 3+3, PSA 4.59, active survillance/  biopsy 02-06-2018 , Stage T1c, Gleason 3+4, PSA 7.08-- scheduled for radioactive seed implants  . Thyroid mass    see [place] 8/20  . Wears glasses    Past Surgical History:  Procedure Laterality Date  . CYSTOSCOPY  06/20/2018   Procedure: CYSTOSCOPY FLEXIBLE;  Surgeon: Irine Seal, MD;  Location: [facility];  Service: Urology;;  NO SEEDS  FOUND IN BLADDER  . NO PAST SURGERIES    . PROSTATE BIOPSY  01-30-2017;  02-06-2018  dr Jeffie Pollock office  . RADIOACTIVE SEED IMPLANT N/A 06/20/2018   Procedure: RADIOACTIVE SEED IMPLANT/BRACHYTHERAPY IMPLANT;  Surgeon: Irine Seal, MD;  Location: [facility];  Service: Urology;  Laterality: N/A;   86   SEEDS IMPLANTED  . SPACE OAR INSTILLATION N/A 06/20/2018   Procedure: SPACE OAR INSTILLATION;  Surgeon: Irine Seal, MD;  Location: [facility];  Service: Urology;  Laterality: N/A;   Current Outpatient Medications on File Prior to Visit  Medication Sig Dispense Refill  . allopurinol (ZYLOPRIM) 300 MG tablet Take 1 tablet (300 mg total) by mouth daily. 90 tablet 3  . atorvastatin (LIPITOR) 40 MG tablet Take 1 tablet by mouth once daily 90 tablet 0  . colchicine 0.6 MG tablet TAKE 1 TABLET BY MOUTH ONCE DAILY(APPT REQUIRED FOR FUTURE REFILLS) 30 tablet 0  . cyclobenzaprine (FLEXERIL) 10 MG tablet TAKE 1 TABLET BY MOUTH THREE TIMES DAILY AS NEEDED -  NEEDS  OFFICE  VISIT  AND  LABS  BEFORE  FURTHER  REFILLS (Patient not taking: Reported on 05/24/2020) 90 tablet 0  . hydrochlorothiazide (HYDRODIURIL) 25 MG tablet Take 1 tablet by mouth once daily 90 tablet 0  . lisinopril (ZESTRIL) 40 MG tablet Take 1 tablet (40 mg total) by mouth daily. 90 tablet 1  . verapamil (CALAN-SR) 240 MG CR tablet Take 1 tablet by mouth once daily 90 tablet 0   No current facility-administered medications on file prior to visit.   No Known Allergies Social History   Socioeconomic History  . Marital status: Married    Spouse name: Not on file  . Number of children: Not on file  . Years of education: Not on file  . Highest education level: Not on file  Occupational History  . Occupation: Nature conservation officer  Tobacco Use  . Smoking status: Current Some Day Smoker    Types: Cigars  . Smokeless tobacco: Never Used  . Tobacco comment: 06-13-2018  per pt every once in a while , cigar  Vaping  Use  . Vaping Use: Never used  Substance and Sexual Activity  . Alcohol use: Not Currently    Alcohol/week: 7.0 standard drinks    Types: 7 Cans of beer per week  . Drug use: No  . Sexual activity: Not Currently  Other Topics Concern  . 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    Social Determinants of Health   Financial Resource Strain:   . Difficulty of Paying Living Expenses: Not on file  Food Insecurity:   . Worried About Charity fundraiser in the Last Year: Not on file  . Ran Out of Food in the Last Year: Not on file  Transportation Needs:   . Lack of Transportation (Medical): Not on file  . Lack of Transportation (Non-Medical): Not on file  Physical Activity:   . Days of Exercise per Week: Not on file  . Minutes of Exercise per Session: Not on file  Stress:   . Feeling of Stress : Not on file  Social Connections:   . Frequency of Communication with Friends and Family: Not on file  . Frequency of Social Gatherings with Friends and Family: Not on file  . Attends Religious Services: Not on file  . Active Member of Clubs or Organizations: Not on file  . Attends Archivist Meetings: Not on file  . Marital Status: Not on file  Intimate Partner Violence:   . Fear of Current or Ex-Partner: Not on file  . Emotionally Abused: Not on file  . Physically Abused: Not on file  . Sexually Abused: Not on file   Family History  Problem Relation Age of Onset  . Pancreatic cancer Mother   . Leukemia Father   . Lung cancer Brother   . Lung cancer Brother   . Lung cancer Brother   '    Review of Systems  All other systems reviewed and are negative.      Objective:   Physical Exam Constitutional:      General: He is not in acute distress.    Appearance: Normal appearance. He is obese. He is not ill-appearing, toxic-appearing or diaphoretic.  HENT:     Head: Normocephalic and atraumatic.     Right  Ear: Tympanic membrane, ear canal and external ear normal.     Left Ear: Tympanic membrane, ear canal and external ear normal.     Nose: Nose normal. No congestion or rhinorrhea.     Mouth/Throat:     Mouth: Mucous membranes are moist.     Pharynx: Oropharynx is clear. No oropharyngeal exudate.  Eyes:     General: No scleral icterus.       Right eye: No discharge.        Left eye: No discharge.     Extraocular Movements: Extraocular movements intact.     Conjunctiva/sclera: Conjunctivae normal.     Pupils: Pupils are equal, round, and reactive to light.  Neck:     Thyroid: Thyromegaly present.     Vascular: Carotid bruit present.  Cardiovascular:     Rate and Rhythm: Normal rate and regular rhythm.     Pulses: Normal pulses.     Heart sounds: Normal heart sounds.  No murmur heard.  No friction rub. No gallop.   Pulmonary:     Effort: Pulmonary effort is normal. No respiratory distress.     Breath sounds: Normal breath sounds. No stridor. No wheezing, rhonchi or rales.  Chest:     Chest wall: No tenderness.  Abdominal:     General: Bowel sounds are normal. There is no distension.     Palpations: Abdomen is soft. There is no mass.     Tenderness: There is no abdominal tenderness. There is no right CVA tenderness, left CVA tenderness, guarding or rebound.     Hernia: No hernia is present.  Musculoskeletal:        General: Normal range of motion.     Cervical back: Neck supple. No muscular tenderness.     Right lower leg: No edema.     Left lower leg: No edema.  Skin:    General: Skin is warm.     Coloration: Skin is not jaundiced or pale.     Findings: No bruising, erythema, lesion or rash.  Neurological:     General: No focal deficit present.     Mental Status: He is alert and oriented to person, place, and time.     Cranial Nerves: No cranial nerve deficit.     Sensory: No sensory deficit.     Motor: No weakness.     Coordination: Coordination normal.     Gait: Gait  normal.     Deep Tendon Reflexes: Reflexes normal.  Psychiatric:        Mood and Affect: Mood normal.        Behavior: Behavior normal.        Thought Content: Thought content normal.        Judgment: Judgment normal.           Assessment & Plan:  Goiter - Plan: T4, free, TSH  Controlled type 2 diabetes mellitus with complication, without long-term current use of insulin (HCC) - Plan: CBC with Differential/Platelet, COMPLETE METABOLIC PANEL WITH GFR, Hemoglobin A1c  Benign essential HTN  I am concerned about the growth of the goiter over the last year.  I am concerned that eventually this is going to cause obstructive symptoms.  Therefore I will first check a TSH and a T4 to determine if the patient has a euthyroid state.  If he is euthyroid, he may want to discuss surgical debulking versus radioactive iodine therapy with a general surgeon.  I will also check a CBC and a CMP and a A1c today.  Blood pressure is well controlled.  Flu shot is up-to-date as is his Covid shot.

## 2020-08-14 LAB — COMPLETE METABOLIC PANEL WITH GFR
AG Ratio: 1.7 (calc) (ref 1.0–2.5)
ALT: 24 U/L (ref 9–46)
AST: 21 U/L (ref 10–35)
Albumin: 4.3 g/dL (ref 3.6–5.1)
Alkaline phosphatase (APISO): 66 U/L (ref 35–144)
BUN/Creatinine Ratio: 16 (calc) (ref 6–22)
BUN: 26 mg/dL — ABNORMAL HIGH (ref 7–25)
CO2: 23 mmol/L (ref 20–32)
Calcium: 9.5 mg/dL (ref 8.6–10.3)
Chloride: 109 mmol/L (ref 98–110)
Creat: 1.64 mg/dL — ABNORMAL HIGH (ref 0.70–1.18)
GFR, Est African American: 46 mL/min/{1.73_m2} — ABNORMAL LOW (ref >=60)
GFR, Est Non African American: 40 mL/min/{1.73_m2} — ABNORMAL LOW (ref >=60)
Globulin: 2.6 g/dL (calc) (ref 1.9–3.7)
Glucose, Bld: 128 mg/dL — ABNORMAL HIGH (ref 65–99)
Potassium: 4.1 mmol/L (ref 3.5–5.3)
Sodium: 142 mmol/L (ref 135–146)
Total Bilirubin: 0.6 mg/dL (ref 0.2–1.2)
Total Protein: 6.9 g/dL (ref 6.1–8.1)

## 2020-08-14 LAB — CBC WITH DIFFERENTIAL/PLATELET
Absolute Monocytes: 677 cells/uL (ref 200–950)
Basophils Absolute: 19 cells/uL (ref 0–200)
Basophils Relative: 0.4 %
Eosinophils Absolute: 139 cells/uL (ref 15–500)
Eosinophils Relative: 2.9 %
HCT: 41.8 % (ref 38.5–50.0)
Hemoglobin: 13.8 g/dL (ref 13.2–17.1)
Lymphs Abs: 1253 cells/uL (ref 850–3900)
MCH: 28.5 pg (ref 27.0–33.0)
MCHC: 33 g/dL (ref 32.0–36.0)
MCV: 86.2 fL (ref 80.0–100.0)
MPV: 11.2 fL (ref 7.5–12.5)
Monocytes Relative: 14.1 %
Neutro Abs: 2712 cells/uL (ref 1500–7800)
Neutrophils Relative %: 56.5 %
Platelets: 181 10*3/uL (ref 140–400)
RBC: 4.85 10*6/uL (ref 4.20–5.80)
RDW: 14.1 % (ref 11.0–15.0)
Total Lymphocyte: 26.1 %
WBC: 4.8 10*3/uL (ref 3.8–10.8)

## 2020-08-14 LAB — HEMOGLOBIN A1C
Hgb A1c MFr Bld: 6.9 % of total Hgb — ABNORMAL HIGH (ref <5.7)
Mean Plasma Glucose: 151 (calc)
eAG (mmol/L): 8.4 (calc)

## 2020-08-14 LAB — T4, FREE: Free T4: 1 ng/dL (ref 0.8–1.8)

## 2020-08-14 LAB — TSH: TSH: 0.91 mIU/L (ref 0.40–4.50)

## 2020-08-17 ENCOUNTER — Other Ambulatory Visit: Payer: Self-pay

## 2020-08-17 DIAGNOSIS — I10 Essential (primary) hypertension: Secondary | ICD-10-CM

## 2020-08-17 MED ORDER — AMLODIPINE BESYLATE 5 MG PO TABS: 5.0000 mg | ORAL_TABLET | Freq: Every day | ORAL | 3 refills | Status: DC

## 2020-08-20 ENCOUNTER — Other Ambulatory Visit: Payer: Self-pay | Admitting: Family Medicine

## 2020-08-24 ENCOUNTER — Telehealth: Payer: Self-pay

## 2020-08-24 NOTE — Telephone Encounter (Signed)
Spoke with the daughter of Travis Singh, moving forward Travis Singh gave permission to call his daughter, will call her to give lab results, she is a Therapist, sports and handles her dad's medications.

## 2020-08-25 ENCOUNTER — Other Ambulatory Visit: Payer: Medicare Other

## 2020-08-25 ENCOUNTER — Other Ambulatory Visit: Payer: Self-pay

## 2020-08-25 DIAGNOSIS — I10 Essential (primary) hypertension: Secondary | ICD-10-CM

## 2020-08-25 NOTE — Telephone Encounter (Signed)
Spoke with Pt daughter about lab results

## 2020-08-26 LAB — BASIC METABOLIC PANEL
BUN/Creatinine Ratio: 23 (calc) — ABNORMAL HIGH (ref 6–22)
BUN: 35 mg/dL — ABNORMAL HIGH (ref 7–25)
CO2: 21 mmol/L (ref 20–32)
Calcium: 9.4 mg/dL (ref 8.6–10.3)
Chloride: 109 mmol/L (ref 98–110)
Creat: 1.55 mg/dL — ABNORMAL HIGH (ref 0.70–1.18)
Glucose, Bld: 126 mg/dL — ABNORMAL HIGH (ref 65–99)
Potassium: 4.6 mmol/L (ref 3.5–5.3)
Sodium: 141 mmol/L (ref 135–146)

## 2020-08-27 ENCOUNTER — Other Ambulatory Visit: Payer: Self-pay | Admitting: Family Medicine

## 2020-09-28 DIAGNOSIS — W009XXA Unspecified fall due to ice and snow, initial encounter: Secondary | ICD-10-CM | POA: Diagnosis not present

## 2020-09-28 DIAGNOSIS — S62348A Nondisplaced fracture of base of other metacarpal bone, initial encounter for closed fracture: Secondary | ICD-10-CM | POA: Diagnosis not present

## 2020-09-30 ENCOUNTER — Telehealth: Payer: Self-pay | Admitting: *Deleted

## 2020-09-30 NOTE — Telephone Encounter (Signed)
Received VM from patient.   Reports that he had fall on 09/29/2019 due to ice and fractured hand.   Reports that he was seen at [facility] and referral for ortho was supposed to be placed. No notes in chart.   Call placed to patient to inquire. LMTRC.

## 2020-10-01 ENCOUNTER — Telehealth: Payer: Self-pay

## 2020-10-01 NOTE — Telephone Encounter (Signed)
Spoke with wife of pt, pt fell and broke hand. Has appt with ortho 10/04/20. Will call for follow up appt

## 2020-10-01 NOTE — Telephone Encounter (Signed)
Per chart notes, patient has appointment with ortho on 10/04/2020

## 2020-10-04 DIAGNOSIS — S60222A Contusion of left hand, initial encounter: Secondary | ICD-10-CM | POA: Diagnosis not present

## 2020-10-28 DIAGNOSIS — Z8546 Personal history of malignant neoplasm of prostate: Secondary | ICD-10-CM | POA: Diagnosis not present

## 2020-11-01 DIAGNOSIS — Z8546 Personal history of malignant neoplasm of prostate: Secondary | ICD-10-CM | POA: Diagnosis not present

## 2020-11-01 DIAGNOSIS — N5201 Erectile dysfunction due to arterial insufficiency: Secondary | ICD-10-CM | POA: Diagnosis not present

## 2020-11-04 ENCOUNTER — Ambulatory Visit (INDEPENDENT_AMBULATORY_CARE_PROVIDER_SITE_OTHER): Payer: Medicare Other | Admitting: Family Medicine

## 2020-11-04 ENCOUNTER — Other Ambulatory Visit: Payer: Self-pay

## 2020-11-04 ENCOUNTER — Encounter: Payer: Self-pay | Admitting: Family Medicine

## 2020-11-04 VITALS — BP 134/78 | HR 76 | Temp 97.8°F | Resp 16 | Ht 68.0 in | Wt 208.0 lb

## 2020-11-04 DIAGNOSIS — M79642 Pain in left hand: Secondary | ICD-10-CM | POA: Diagnosis not present

## 2020-11-04 NOTE — Progress Notes (Signed)
Subjective:    Patient ID: Travis Singh, male    DOB: 1946-04-20, 75 y.o.   MRN: [id number]  HPI  Patient slipped on the ice and fell 3 weeks ago.  He went to an urgent care due to pain in his left hand.  He states that he reached at his left hand to catch himself when he fell.  He jammed his hand and suffered an injury to the dorsal surface of his hand.  He reports swelling and pain over the third and fourth metatarsals.  He states that in urgent care he was told that he fractured them.  He states that the provider there "set the bones".  She then put him in some kind of removable wrap that sounds like an Ace bandage and referred him to [place].  He states that he was seen recently at [place] and they took x-rays and said that everything was fine to use his terminology.  However he now has pain and swelling in his hand.  He is tender to palpation over the middle of the third metatarsal and fourth metatarsal.  He reports pain with range of motion of his MCP joints.  He denies any pain with range of motion of his wrist his thumb or his index finger or his fifth digit. Past Medical History:  Diagnosis Date  . Bulging lumbar disc    multiple levels  . Diabetes mellitus type 2 with complications ([place])   . ED (erectile dysfunction)   . Gout   . History of adenomatous polyp of colon   . Hyperlipidemia   . Hypertension   . Left carotid artery stenosis <50%  . Left sciatic nerve pain   . Lumbar stenosis   . Nocturia   . Prostate cancer [facility]) urologist-  dr wrenn/ oncologist-  dr Tammi Klippel   dx 01-30-2017 w/ Gleason 3+3, PSA 4.59, active survillance/  biopsy 02-06-2018 , Stage T1c, Gleason 3+4, PSA 7.08-- scheduled for radioactive seed implants  . Thyroid mass    see [place] 8/20  . Wears glasses    Past Surgical History:  Procedure Laterality Date  . CYSTOSCOPY  06/20/2018   Procedure: CYSTOSCOPY FLEXIBLE;  Surgeon: Irine Seal, MD;  Location: [facility];  Service: Urology;;  NO SEEDS FOUND IN BLADDER  . NO PAST SURGERIES    . PROSTATE BIOPSY  01-30-2017;  02-06-2018  dr Jeffie Pollock office  . RADIOACTIVE SEED IMPLANT N/A 06/20/2018   Procedure: RADIOACTIVE SEED IMPLANT/BRACHYTHERAPY IMPLANT;  Surgeon: Irine Seal, MD;  Location: [place];  Service: Urology;  Laterality: N/A;   86   SEEDS IMPLANTED  . SPACE OAR INSTILLATION N/A 06/20/2018   Procedure: SPACE OAR INSTILLATION;  Surgeon: Irine Seal, MD;  Location[facility];  Service: Urology;  Laterality: N/A;   Current Outpatient Medications on File Prior to Visit  Medication Sig Dispense Refill  . allopurinol (ZYLOPRIM) 300 MG tablet Take 1 tablet by mouth once daily 90 tablet 0  . amLODipine (NORVASC) 5 MG tablet Take 1 tablet (5 mg total) by mouth daily. 90 tablet 3  . atorvastatin (LIPITOR) 40 MG tablet Take 1 tablet by mouth once daily 90 tablet 0  . colchicine 0.6 MG tablet TAKE 1 TABLET BY MOUTH ONCE DAILY(APPT REQUIRED FOR FUTURE REFILLS) 30 tablet 3  . hydrochlorothiazide (HYDRODIURIL) 25 MG tablet Take 1 tablet by mouth once daily 90 tablet 0  . lisinopril (ZESTRIL) 40 MG tablet Take 1 tablet (40 mg  total) by mouth daily. 90 tablet 1  . verapamil (CALAN-SR) 240 MG CR tablet Take 1 tablet by mouth once daily 90 tablet 0   No current facility-administered medications on file prior to visit.   No Known Allergies Social History   Socioeconomic History  . Marital status: Married    Spouse name: Not on file  . Number of children: Not on file  . Years of education: Not on file  . Highest education level: Not on file  Occupational History  . Occupation: Nature conservation officer  Tobacco Use  . Smoking status: Current Some Day Smoker    Types: Cigars  . Smokeless tobacco: Never Used  . Tobacco comment: 06-13-2018  per pt every once in a while , cigar  Vaping Use  . Vaping Use: Never used  Substance and Sexual Activity  . Alcohol use: Not Currently     Alcohol/week: 7.0 standard drinks    Types: 7 Cans of beer per week  . Drug use: No  . Sexual activity: Not Currently  Other Topics Concern  . 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    Social Determinants of Health   Financial Resource Strain: Not on file  Food Insecurity: Not on file  Transportation Needs: Not on file  Physical Activity: Not on file  Stress: Not on file  Social Connections: Not on file  Intimate Partner Violence: Not on file   Family History  Problem Relation Age of Onset  . Pancreatic cancer Mother   . Leukemia Father   . Lung cancer Brother   . Lung cancer Brother   . Lung cancer Brother   '    Review of Systems  All other systems reviewed and are negative.      Objective:   Physical Exam Constitutional:      General: He is not in acute distress.    Appearance: Normal appearance. He is obese. He is not ill-appearing, toxic-appearing or diaphoretic.  HENT:     Head: Normocephalic and atraumatic.  Neck:     Thyroid: Thyromegaly present.  Cardiovascular:     Rate and Rhythm: Normal rate and regular rhythm.     Pulses: Normal pulses.     Heart sounds: Normal heart sounds. No murmur heard. No friction rub. No gallop.   Pulmonary:     Effort: Pulmonary effort is normal. No respiratory distress.     Breath sounds: Normal breath sounds. No stridor. No wheezing, rhonchi or rales.  Chest:     Chest wall: No tenderness.  Musculoskeletal:     Left hand: Swelling, tenderness and bony tenderness present. No deformity or lacerations. Decreased range of motion. Normal strength.       Hands:     Cervical back: No muscular tenderness.  Neurological:     Mental Status: He is alert.           Assessment & Plan:  Left hand pain  I am uncertain of what happened.  I believe the patient was put in a splint by a urgent care and then referred to an orthopedist and based on his  discussion it sounds like they ruled out a fracture and treated him as a sprain however he is now having swelling in his palm with pain over the third and fourth metatarsals.  He is just had x-rays and does not want to repeat the x-rays.  Therefore I put the patient in a  boxer's fracture flexion and the MCP joints of the fourth and fifth digits in 90 degrees of flexion.  I will see the patient back in 1 week to remove the splint and then reassess to see if the pain and swelling have subsided.  Would likely need to repeat x-rays at that time.  Patient states that the cast is not too tight and is comfortable

## 2020-11-12 ENCOUNTER — Ambulatory Visit (INDEPENDENT_AMBULATORY_CARE_PROVIDER_SITE_OTHER): Payer: Medicare Other | Admitting: Family Medicine

## 2020-11-12 ENCOUNTER — Encounter: Payer: Self-pay | Admitting: Family Medicine

## 2020-11-12 ENCOUNTER — Ambulatory Visit
Admission: RE | Admit: 2020-11-12 | Discharge: 2020-11-12 | Disposition: A | Discharge status: Home / Self Care | Payer: Medicare Other | Source: Ambulatory Visit | Attending: Family Medicine | Admitting: Family Medicine

## 2020-11-12 ENCOUNTER — Other Ambulatory Visit: Payer: Self-pay

## 2020-11-12 VITALS — BP 120/80 | HR 62 | Temp 98.6°F

## 2020-11-12 DIAGNOSIS — M79642 Pain in left hand: Secondary | ICD-10-CM | POA: Diagnosis not present

## 2020-11-12 NOTE — Progress Notes (Signed)
Subjective:    Patient ID: Travis Singh, male    DOB: 01/15/1946, 75 y.o.   MRN: [id number]  HPI  11/04/20 Patient slipped on the ice and fell 3 weeks ago.  He went to an urgent care due to pain in his left hand.  He states that he reached at his left hand to catch himself when he fell.  He jammed his hand and suffered an injury to the dorsal surface of his hand.  He reports swelling and pain over the third and fourth metatarsals.  He states that in urgent care he was told that he fractured them.  He states that the provider there "set the bones".  She then put him in some kind of removable wrap that sounds like an Ace bandage and referred him to [place].  He states that he was seen recently at [place] and they took x-rays and said that everything was fine to use his terminology.  However he now has pain and swelling in his hand.  He is tender to palpation over the middle of the third metatarsal and fourth metatarsal.  He reports pain with range of motion of his MCP joints.  He denies any pain with range of motion of his wrist his thumb or his index finger or his fifth digit.  At that time, my plan was: I am uncertain of what happened.  I believe the patient was put in a splint by a urgent care and then referred to an orthopedist and based on his discussion it sounds like they ruled out a fracture and treated him as a sprain however he is now having swelling in his palm with pain over the third and fourth metatarsals.  He is just had x-rays and does not want to repeat the x-rays.  Therefore I put the patient in a boxer's fracture flexion and the MCP joints of the fourth and fifth digits in 90 degrees of flexion.  I will see the patient back in 1 week to remove the splint and then reassess to see if the pain and swelling have subsided.  Would likely need to repeat x-rays at that time.  Patient states that the cast is not too tight and is comfortable  11/12/20 I removed  his cast this morning without complication.  He still has some mild swelling in the dorsum of his left hand.  There is no point tenderness to palpation over the third or fourth metacarpals today.  There is no redness or warmth.  Patient does have diminished range of motion in his fourth and fifth MCP joint and PIP joints due to stiffness from wearing the cast. Past Medical History:  Diagnosis Date  . Bulging lumbar disc    multiple levels  . Diabetes mellitus type 2 with complications ([place])   . ED (erectile dysfunction)   . Gout   . History of adenomatous polyp of colon   . Hyperlipidemia   . Hypertension   . Left carotid artery stenosis <50%  . Left sciatic nerve pain   . Lumbar stenosis   . Nocturia   . Prostate cancer [facility]) urologist-  dr wrenn/ oncologist-  dr Tammi Klippel   dx 01-30-2017 w/ Gleason 3+3, PSA 4.59, active survillance/  biopsy 02-06-2018 , Stage T1c, Gleason 3+4, PSA 7.08-- scheduled for radioactive seed implants  . Thyroid mass    see [place] 8/20  . Wears glasses    Past Surgical History:  Procedure Laterality Date  . CYSTOSCOPY  06/20/2018   Procedure: CYSTOSCOPY FLEXIBLE;  Surgeon: Irine Seal, MD;  Location: [facility];  Service: Urology;;  NO SEEDS FOUND IN BLADDER  . NO PAST SURGERIES    . PROSTATE BIOPSY  01-30-2017;  02-06-2018  dr Jeffie Pollock office  . RADIOACTIVE SEED IMPLANT N/A 06/20/2018   Procedure: RADIOACTIVE SEED IMPLANT/BRACHYTHERAPY IMPLANT;  Surgeon: Irine Seal, MD;  Location: [facility];  Service: Urology;  Laterality: N/A;   86   SEEDS IMPLANTED  . SPACE OAR INSTILLATION N/A 06/20/2018   Procedure: SPACE OAR INSTILLATION;  Surgeon: Irine Seal, MD;  Location: [facility];  Service: Urology;  Laterality: N/A;   Current Outpatient Medications on File Prior to Visit  Medication Sig Dispense Refill  . allopurinol (ZYLOPRIM) 300 MG tablet Take 1 tablet by mouth once daily 90 tablet 0  . amLODipine (NORVASC) 5 MG  tablet Take 1 tablet (5 mg total) by mouth daily. 90 tablet 3  . atorvastatin (LIPITOR) 40 MG tablet Take 1 tablet by mouth once daily 90 tablet 0  . colchicine 0.6 MG tablet TAKE 1 TABLET BY MOUTH ONCE DAILY(APPT REQUIRED FOR FUTURE REFILLS) 30 tablet 3  . hydrochlorothiazide (HYDRODIURIL) 25 MG tablet Take 1 tablet by mouth once daily 90 tablet 0  . lisinopril (ZESTRIL) 40 MG tablet Take 1 tablet (40 mg total) by mouth daily. 90 tablet 1  . verapamil (CALAN-SR) 240 MG CR tablet Take 1 tablet by mouth once daily 90 tablet 0   No current facility-administered medications on file prior to visit.   No Known Allergies Social History   Socioeconomic History  . Marital status: Married    Spouse name: Not on file  . Number of children: Not on file  . Years of education: Not on file  . Highest education level: Not on file  Occupational History  . Occupation: Nature conservation officer  Tobacco Use  . Smoking status: Current Some Day Smoker    Types: Cigars  . Smokeless tobacco: Never Used  . Tobacco comment: 06-13-2018  per pt every once in a while , cigar  Vaping Use  . Vaping Use: Never used  Substance and Sexual Activity  . Alcohol use: Not Currently    Alcohol/week: 7.0 standard drinks    Types: 7 Cans of beer per week  . Drug use: No  . Sexual activity: Not Currently  Other Topics Concern  . 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    Social Determinants of Health   Financial Resource Strain: Not on file  Food Insecurity: Not on file  Transportation Needs: Not on file  Physical Activity: Not on file  Stress: Not on file  Social Connections: Not on file  Intimate Partner Violence: Not on file   Family History  Problem Relation Age of Onset  . Pancreatic cancer Mother   . Leukemia Father   . Lung cancer Brother   . Lung cancer Brother   . Lung cancer Brother   '    Review of Systems  All other systems  reviewed and are negative.      Objective:   Physical Exam Constitutional:      General: He is not in acute distress.    Appearance: Normal appearance. He is obese. He is not ill-appearing, toxic-appearing or diaphoretic.  HENT:     Head: Normocephalic and atraumatic.  Neck:     Thyroid: Thyromegaly present.  Cardiovascular:  Rate and Rhythm: Normal rate and regular rhythm.     Pulses: Normal pulses.     Heart sounds: Normal heart sounds. No murmur heard. No friction rub. No gallop.   Pulmonary:     Effort: Pulmonary effort is normal. No respiratory distress.     Breath sounds: Normal breath sounds. No stridor. No wheezing, rhonchi or rales.  Chest:     Chest wall: No tenderness.  Musculoskeletal:     Left hand: Swelling present. No deformity, lacerations, tenderness or bony tenderness. Decreased range of motion. Normal strength.       Hands:     Cervical back: No muscular tenderness.  Neurological:     Mental Status: He is alert.           Assessment & Plan:  Left hand pain - Plan: DG Hand Complete Left  I want to start to mobilize the hand early to avoid loss of range of motion.  Therefore I will send the patient for an x-ray today.  If there is no visible fracture or if there is sufficient fracture callus formation, I will just put the patient in an ulnar gutter splint that he can wear with activity.  He already has a splint in his possession that was given to him at an urgent care.  If there is a nonunion, I will refer the patient back to orthopedist however the pain is better after wearing the cast for a week

## 2020-11-18 ENCOUNTER — Telehealth: Payer: Self-pay | Admitting: *Deleted

## 2020-11-18 DIAGNOSIS — M79642 Pain in left hand: Secondary | ICD-10-CM

## 2020-11-18 NOTE — Telephone Encounter (Signed)
Received call from patient daughter Denman George.   Inquired as to if there is a plan of care moving forward for patient hand/ wrist other than wearing splint while at work.   Inquired as to if patient requires PT as he continues to have weakness in hand and cannot make fist.   Please advise.

## 2020-11-18 NOTE — Telephone Encounter (Signed)
Call placed to patient and patient daughter Travis Singh made aware.   Referral placed for PT.

## 2020-11-18 NOTE — Telephone Encounter (Signed)
Definitely would recommend PT if he cannot make a fist.

## 2020-11-21 ENCOUNTER — Other Ambulatory Visit: Payer: Self-pay | Admitting: Family Medicine

## 2020-12-10 ENCOUNTER — Ambulatory Visit (INDEPENDENT_AMBULATORY_CARE_PROVIDER_SITE_OTHER): Payer: Medicare Other | Admitting: Rehabilitative and Restorative Service Providers"

## 2020-12-10 ENCOUNTER — Other Ambulatory Visit: Payer: Self-pay

## 2020-12-10 ENCOUNTER — Encounter: Payer: Self-pay | Admitting: Rehabilitative and Restorative Service Providers"

## 2020-12-10 DIAGNOSIS — M79642 Pain in left hand: Secondary | ICD-10-CM | POA: Diagnosis not present

## 2020-12-10 DIAGNOSIS — M25642 Stiffness of left hand, not elsewhere classified: Secondary | ICD-10-CM | POA: Diagnosis not present

## 2020-12-10 DIAGNOSIS — R600 Localized edema: Secondary | ICD-10-CM | POA: Diagnosis not present

## 2020-12-10 DIAGNOSIS — M25632 Stiffness of left wrist, not elsewhere classified: Secondary | ICD-10-CM

## 2020-12-10 NOTE — Addendum Note (Signed)
Addended by: Maricela Bo on: 12/10/2020 01:59 PM   Modules accepted: Orders

## 2020-12-10 NOTE — Therapy (Signed)
[facility] [address] [place], [place], [address] Phone: [phone]   Fax:  [phone]  Physical Therapy Treatment  Patient Details  Name: Travis HYSLOP Sr. MRN: [id number] Date of Birth: 1945-11-04 Referring Provider (PT): [name] MD   Encounter Date: 12/10/2020   PT End of Session - 12/10/20 1346    Visit Number 1    Number of Visits 6    Date for PT Re-Evaluation 01/21/21    PT Start Time 6283    PT Stop Time 1517    PT Time Calculation (min) 38 min    Activity Tolerance Patient tolerated treatment well;No increased pain    Behavior During Therapy WFL for tasks assessed/performed           Past Medical History:  Diagnosis Date  . Bulging lumbar disc    multiple levels  . Diabetes mellitus type 2 with complications ([place])   . ED (erectile dysfunction)   . Gout   . History of adenomatous polyp of colon   . Hyperlipidemia   . Hypertension   . Left carotid artery stenosis <50%  . Left sciatic nerve pain   . Lumbar stenosis   . Nocturia   . Prostate cancer [facility]) urologist-  dr wrenn/ oncologist-  dr Tammi Klippel   dx 01-30-2017 w/ Gleason 3+3, PSA 4.59, active survillance/  biopsy 02-06-2018 , Stage T1c, Gleason 3+4, PSA 7.08-- scheduled for radioactive seed implants  . Thyroid mass    see [place] 8/20  . Wears glasses     Past Surgical History:  Procedure Laterality Date  . CYSTOSCOPY  06/20/2018   Procedure: CYSTOSCOPY FLEXIBLE;  Surgeon: Irine Seal, MD;  Location: [facility];  Service: Urology;;  NO SEEDS FOUND IN BLADDER  . NO PAST SURGERIES    . PROSTATE BIOPSY  01-30-2017;  02-06-2018  dr Jeffie Pollock office  . RADIOACTIVE SEED IMPLANT N/A 06/20/2018   Procedure: RADIOACTIVE SEED IMPLANT/BRACHYTHERAPY IMPLANT;  Surgeon: Irine Seal, MD;  Location: [facility];  Service: Urology;  Laterality: N/A;   86   SEEDS IMPLANTED  . SPACE OAR INSTILLATION N/A 06/20/2018   Procedure: SPACE OAR  INSTILLATION;  Surgeon: Irine Seal, MD;  Location: [facility];  Service: Urology;  Laterality: N/A;    There were no vitals filed for this visit.   Subjective Assessment - 12/10/20 1341    Subjective Travis Singh wacked his L hand when he slipped and tried to grab a stair rail to prevent from falling.  X-Ray shows no fracture but he is very swollen with limited AROM and strength.    Limitations House hold activities;Lifting    Patient Stated Goals Be able to use his L hand normally again, reduce pain and swelling    Currently in Pain? Yes    Pain Score 3     Pain Location Hand    Pain Orientation Left    Pain Descriptors / Indicators Discomfort;Sore;Pressure;Tender    Pain Type Acute pain    Pain Radiating Towards NA    Pain Onset More than a month ago    Pain Frequency Constant    Aggravating Factors  Sore all the time    Effect of Pain on Daily Activities Unable to use L hand as he would like    Multiple Pain Sites No              [facility] PT Assessment - 12/10/20 0001      Assessment   Medical  Diagnosis L hand pain    Referring Provider (PT) [name] MD    Onset Date/Surgical Date 11/08/20    Hand Dominance Right      Precautions   Required Braces or Orthoses --   Using an ACE wrap     Balance Screen   Has the patient fallen in the past 6 months Yes    How many times? 1    Has the patient had a decrease in activity level because of a fear of falling?  No    Is the patient reluctant to leave their home because of a fear of falling?  No      Prior Function   Level of Independence Independent      Cognition   Overall Cognitive Status Within Functional Limits for tasks assessed      Observation/Other Assessments   Focus on Therapeutic Outcomes (FOTO)  32 (Goal 61)      Observation/Other Assessments-Edema    Edema --   L hand edema present     ROM / Strength   AROM / PROM / Strength AROM;Strength      AROM   Overall AROM  Deficits    AROM  Assessment Site Wrist    Right/Left Wrist Left    Right Wrist Extension 5 Degrees    Right Wrist Flexion 5 Degrees    Left Wrist Extension 50 Degrees    Left Wrist Flexion 55 Degrees      Strength   Overall Strength Deficits    Strength Assessment Site Hand    Right/Left hand Left;Right    Right Hand Grip (lbs) 62.1    Left Hand Grip (lbs) 9.7                         [facility] Adult PT Treatment/Exercise - 12/10/20 0001      Exercises   Exercises Hand;Wrist      Hand Exercises   MCPJ Flexion AROM;Left;10 reps    MCPJ Extension AROM;Left;10 reps    PIPJ Flexion AROM;Left;10 reps    PIPJ Extension AROM;Left;10 reps    DIPJ Flexion AROM;Left;10 reps    DIPJ Extension AROM;Left;10 reps   4 Part Fisting for all hand AROM (Full hand extension; make a claw-PIP flexion; make a fist-DIP flexion; MCP flexion)     Wrist Exercises   Wrist Flexion --   2 minutes of wrist flexion and extension AROM   Wrist Extension --   2 minutes of wrist flexion and extension AROM                 PT Education - 12/10/20 1344    Education Details Discussed how the ACE wrap is optional and doing exercises 5 or more times/day will be required to meet LTGs.    Person(s) Educated Patient    Methods Explanation;Demonstration;Tactile cues;Verbal cues;Handout    Comprehension Verbal cues required;Returned demonstration;Need further instruction;Tactile cues required;Verbalized understanding            PT Short Term Goals - 12/10/20 1351      PT SHORT TERM GOAL #1   Title Travis Singh will be able to make a fist.    Baseline Unable    Time 4    Period Weeks    Status New    Target Date 01/07/21             PT Long Term Goals - 12/10/20 1351      PT LONG TERM  GOAL #1   Title Improve FOTO score to 61.    Baseline 32    Time 6    Period Weeks    Status New    Target Date 01/21/21      PT LONG TERM GOAL #2   Title Travis Singh will have L wrist AROM at 90% or greater compared to  the uninvolved R.    Baseline 9.5%    Time 6    Period Weeks    Status New    Target Date 01/21/21      PT LONG TERM GOAL #3   Title Travis Singh will have at least 50 pounds of L grip strength.    Baseline < 10 pounds    Time 6    Period Weeks    Status New    Target Date 01/21/21      PT LONG TERM GOAL #4   Title [place] will return to work without restrictions or L hand pain at DC.    Time 6    Period Weeks    Status New    Target Date 01/21/21                 Plan - 12/10/20 1346    Clinical Impression Statement Kyung Rudd wacked his L hand on a stair rail when he slipped.  X-Rays show no fracture but swelling is present as is a lack of wrist and hand AROM and grip weakness.  He was started on a gentle AROM program for wrist and hand to reduce edema, improve AROM and decrease pain.  Other grip strength activities will be added as needed to reduce edema and pain and improve AROM and strength.    Examination-Activity Limitations Carry;Other   Limits L hand use at work   Examination-Participation Restrictions Occupation;Community Activity    Stability/Clinical Decision Making Stable/Uncomplicated    Clinical Decision Making Low    Rehab Potential Good    PT Frequency 1x / week    PT Duration 6 weeks    PT Treatment/Interventions ADLs/Self Care Home Management;Cryotherapy;Therapeutic activities;Therapeutic exercise;Neuromuscular re-education;Patient/family education;Manual techniques;Compression bandaging;Passive range of motion;Vasopneumatic Device    PT Next Visit Plan Review basic wrist and hand AROM.  Add some grip and functional strength activities.  Possibly ice or vaso to help with pain and swelling.    PT Home Exercise Plan Access Code: [email or web address]  URL: [email or web address]  Date: 12/10/2020  Prepared by: Vista Mink    Exercises  Wrist Flexion Extension AROM with Fingers Curled and Palm Down - 5 x daily - 7 x weekly - 1 sets - 1 reps    Consulted and Agree  with Plan of Care Patient           Patient will benefit from skilled therapeutic intervention in order to improve the following deficits and impairments:  Decreased activity tolerance,Decreased endurance,Decreased range of motion,Decreased strength,Increased edema,Impaired flexibility,Impaired UE functional use,Pain  Visit Diagnosis: Localized edema  Stiffness of left hand, not elsewhere classified  Stiffness of left wrist, not elsewhere classified  Pain in left hand     Problem List Patient Active Problem List   Diagnosis Date Noted  . Diabetes mellitus type 2 with complications ([place])   . Thyroid mass   . Left carotid artery stenosis   . Prostate cancer ([place])   . Right sided sciatica   . Other and unspecified hyperlipidemia 09/23/2013  . Sciatica 09/23/2013  . Hypertension     Farley Ly PT, MPT 12/10/2020, 1:55 PM  [facility] [address] [place], [place], [address] Phone: [phone]   Fax:  [phone]  Name: MARKIS LANGLAND Sr. MRN: [id number] Date of Birth: 1946-05-17

## 2020-12-10 NOTE — Patient Instructions (Signed)
Access Code: [email or web address] URL: [email or web address] Date: 12/10/2020 Prepared by: Vista Mink  Exercises Wrist Flexion Extension AROM with Fingers Curled and Palm Down - 5 x daily - 7 x weekly - 1 sets - 1 reps   4 Part Fisting: Whole hand extension; PIP flexion; DIP flexion; MCP flexion  2-3 minutes for each exercise 5X/Day

## 2020-12-15 ENCOUNTER — Encounter: Payer: Self-pay | Admitting: Rehabilitative and Restorative Service Providers"

## 2020-12-15 ENCOUNTER — Other Ambulatory Visit: Payer: Self-pay

## 2020-12-15 ENCOUNTER — Ambulatory Visit (INDEPENDENT_AMBULATORY_CARE_PROVIDER_SITE_OTHER): Payer: Medicare Other | Admitting: Rehabilitative and Restorative Service Providers"

## 2020-12-15 DIAGNOSIS — R600 Localized edema: Secondary | ICD-10-CM

## 2020-12-15 DIAGNOSIS — M25642 Stiffness of left hand, not elsewhere classified: Secondary | ICD-10-CM | POA: Diagnosis not present

## 2020-12-15 DIAGNOSIS — M79642 Pain in left hand: Secondary | ICD-10-CM | POA: Diagnosis not present

## 2020-12-15 DIAGNOSIS — M25632 Stiffness of left wrist, not elsewhere classified: Secondary | ICD-10-CM | POA: Diagnosis not present

## 2020-12-15 NOTE — Patient Instructions (Signed)
Continue current HEP 

## 2020-12-15 NOTE — Therapy (Signed)
[facility] Physical Therapy [address] [place], [place], [address] Phone: [phone]   Fax:  [phone]  Physical Therapy Treatment  Patient Details  Name: Travis SPROULE Sr. MRN: [id number] Date of Birth: 05/26/1946 Referring Provider (PT): [name] MD   Encounter Date: 12/15/2020   PT End of Session - 12/15/20 1434    Visit Number 2    Number of Visits 6    Date for PT Re-Evaluation 01/21/21    PT Start Time 7510    PT Stop Time 1424    PT Time Calculation (min) 39 min    Activity Tolerance Patient tolerated treatment well;No increased pain    Behavior During Therapy WFL for tasks assessed/performed           Past Medical History:  Diagnosis Date  . Bulging lumbar disc    multiple levels  . Diabetes mellitus type 2 with complications ([place])   . ED (erectile dysfunction)   . Gout   . History of adenomatous polyp of colon   . Hyperlipidemia   . Hypertension   . Left carotid artery stenosis <50%  . Left sciatic nerve pain   . Lumbar stenosis   . Nocturia   . Prostate cancer [facility]) urologist-  dr wrenn/ oncologist-  dr Tammi Klippel   dx 01-30-2017 w/ Gleason 3+3, PSA 4.59, active survillance/  biopsy 02-06-2018 , Stage T1c, Gleason 3+4, PSA 7.08-- scheduled for radioactive seed implants  . Thyroid mass    see [place] 8/20  . Wears glasses     Past Surgical History:  Procedure Laterality Date  . CYSTOSCOPY  06/20/2018   Procedure: CYSTOSCOPY FLEXIBLE;  Surgeon: Irine Seal, MD;  Location: [facility];  Service: Urology;;  NO SEEDS FOUND IN BLADDER  . NO PAST SURGERIES    . PROSTATE BIOPSY  01-30-2017;  02-06-2018  dr Jeffie Pollock office  . RADIOACTIVE SEED IMPLANT N/A 06/20/2018   Procedure: RADIOACTIVE SEED IMPLANT/BRACHYTHERAPY IMPLANT;  Surgeon: Irine Seal, MD;  Location: [facility];  Service: Urology;  Laterality: N/A;   86   SEEDS IMPLANTED  . SPACE OAR INSTILLATION N/A 06/20/2018   Procedure: SPACE OAR  INSTILLATION;  Surgeon: Irine Seal, MD;  Location: [facility];  Service: Urology;  Laterality: N/A;    There were no vitals filed for this visit.   Subjective Assessment - 12/15/20 1351    Subjective Travis Singh notes good early HEP compliance.  Travis Singh notes being able to move his hand better with less soreness since starting PT.    Limitations House hold activities;Lifting    Patient Stated Goals Be able to use his Travis Singh hand normally again, reduce pain and swelling    Currently in Pain? Yes    Pain Score 2     Pain Location Hand    Pain Orientation Left    Pain Descriptors / Indicators Sore;Discomfort;Tender;Pressure    Pain Type Acute pain    Pain Radiating Towards NA    Pain Onset More than a month ago    Pain Frequency Constant    Aggravating Factors  Sore with grabbing and Travis Singh UE use, sleeping OK    Pain Relieving Factors Exercises    Effect of Pain on Daily Activities Unable to use Travis Singh hand as Travis Singh would like    Multiple Pain Sites No  [facility] Adult PT Treatment/Exercise - 12/15/20 0001      Exercises   Exercises Hand;Wrist      Hand Exercises   MCPJ Flexion AROM;Left;10 reps   2 sets   MCPJ Extension AROM;Left;10 reps   2 sets   PIPJ Flexion AROM;Left;10 reps   2 sets   PIPJ Extension AROM;Left;10 reps   2 sets   DIPJ Flexion AROM;Left;10 reps   2 sets   DIPJ Extension AROM;Left;10 reps   4 Part Fisting for all hand AROM (Full hand extension; make a claw-PIP flexion; make a fist-DIP flexion; MCP flexion)   DIPJ Extension Limitations 2 sets    Thumb Opposition 20X to each finger    Other Hand Exercises Marbles in hand webbing moving marbles with thumb and ring/pinky    Other Hand Exercises UBE 5 minutes (alternate push/pull)      Wrist Exercises   Wrist Flexion --   2 minutes of wrist flexion and extension AROM along with 20X slow and controlled active wrist flexion and extension 20X with 1#   Wrist Extension --   2 minutes  of wrist flexion and extension AROM and 20X slow and controlled flexion and extension with 1#   Other wrist exercises Pronation/Supination grabbing the bell of a 1# weight 20X controlled, slow motion    Other wrist exercises Hand webbing (each individual finger to thumb in yellow 10X)                  PT Education - 12/15/20 1433    Education Details Reviewed HEP and progressed clinic program.  Travis Singh will continue his current HEP for the time being.    Person(s) Educated Patient    Methods Explanation;Demonstration;Tactile cues;Verbal cues    Comprehension Verbal cues required;Returned demonstration;Need further instruction;Tactile cues required;Verbalized understanding            PT Short Term Goals - 12/15/20 1433      PT SHORT TERM GOAL #1   Title Travis Singh will be able to make a fist.    Baseline Unable    Time 4    Period Weeks    Status On-going    Target Date 01/07/21             PT Long Term Goals - 12/15/20 1433      PT LONG TERM GOAL #1   Title Improve FOTO score to 61.    Baseline 32    Time 6    Period Weeks    Status On-going      PT LONG TERM GOAL #2   Title Travis Singh will have Travis Singh wrist AROM at 90% or greater compared to the uninvolved R.    Baseline 9.5%    Time 6    Period Weeks    Status On-going      PT LONG TERM GOAL #3   Title Travis Singh will have at least 50 pounds of Travis Singh grip strength.    Baseline < 10 pounds    Time 6    Period Weeks    Status On-going      PT LONG TERM GOAL #4   Title Travis Singh will return to work without restrictions or Travis Singh hand pain at DC.    Time 6    Period Weeks    Status On-going                 Plan - 12/15/20 1434    Clinical Impression Statement Travis Singh reports daily compliance with his starter  HEP (4 part fisting and wrist AROM flexion/extension).  Edema is less and AROM better.  Travis Singh still can't make a fist and his Travis Singh side certainly has more edema than his R.  Travis Singh will benefit from continuing his  current POC.    Examination-Activity Limitations Carry;Other   Limits Travis Singh hand use at work   Examination-Participation Restrictions Occupation;Community Activity    Stability/Clinical Decision Making Stable/Uncomplicated    Rehab Potential Good    PT Frequency 1x / week    PT Duration 6 weeks    PT Treatment/Interventions ADLs/Self Care Home Management;Cryotherapy;Therapeutic activities;Therapeutic exercise;Neuromuscular re-education;Patient/family education;Manual techniques;Compression bandaging;Passive range of motion;Vasopneumatic Device    PT Next Visit Plan Review basic wrist and hand AROM.  Add some grip and functional strength activities.  Possibly ice or vaso to help with pain and swelling.    PT Home Exercise Plan Access Code: [email or web address]  URL: [email or web address]  Date: 12/10/2020  Prepared by: Vista Mink    Exercises  Wrist Flexion Extension AROM with Fingers Curled and Palm Down - 5 x daily - 7 x weekly - 1 sets - 1 reps    Consulted and Agree with Plan of Care Patient           Patient will benefit from skilled therapeutic intervention in order to improve the following deficits and impairments:  Decreased activity tolerance,Decreased endurance,Decreased range of motion,Decreased strength,Increased edema,Impaired flexibility,Impaired UE functional use,Pain  Visit Diagnosis: Localized edema  Stiffness of left hand, not elsewhere classified  Stiffness of left wrist, not elsewhere classified  Pain in left hand     Problem List Patient Active Problem List   Diagnosis Date Noted  . Diabetes mellitus type 2 with complications ([place])   . Thyroid mass   . Left carotid artery stenosis   . Prostate cancer ([place])   . Right sided sciatica   . Other and unspecified hyperlipidemia 09/23/2013  . Sciatica 09/23/2013  . Hypertension     Farley Ly PT, MPT 12/15/2020, 2:37 PM  [facility] Physical Therapy [address] [place], [place],  [address] Phone: [phone]   Fax:  [phone]  Name: Travis VAZQUEZ Sr. MRN: [id number] Date of Birth: [birth year]

## 2020-12-23 ENCOUNTER — Other Ambulatory Visit: Payer: Self-pay | Admitting: Family Medicine

## 2020-12-30 ENCOUNTER — Ambulatory Visit (INDEPENDENT_AMBULATORY_CARE_PROVIDER_SITE_OTHER): Payer: Medicare Other | Admitting: Rehabilitative and Restorative Service Providers"

## 2020-12-30 ENCOUNTER — Encounter: Payer: Self-pay | Admitting: Rehabilitative and Restorative Service Providers"

## 2020-12-30 ENCOUNTER — Other Ambulatory Visit: Payer: Self-pay

## 2020-12-30 DIAGNOSIS — M25632 Stiffness of left wrist, not elsewhere classified: Secondary | ICD-10-CM | POA: Diagnosis not present

## 2020-12-30 DIAGNOSIS — M79642 Pain in left hand: Secondary | ICD-10-CM | POA: Diagnosis not present

## 2020-12-30 DIAGNOSIS — M25642 Stiffness of left hand, not elsewhere classified: Secondary | ICD-10-CM | POA: Diagnosis not present

## 2020-12-30 DIAGNOSIS — R600 Localized edema: Secondary | ICD-10-CM | POA: Diagnosis not present

## 2020-12-30 DIAGNOSIS — M6281 Muscle weakness (generalized): Secondary | ICD-10-CM

## 2020-12-30 NOTE — Therapy (Signed)
[facility] Physical Therapy [address] [place], [place], [address] Phone: [phone]   Fax:  [phone]  Physical Therapy Treatment/Reassessment  Patient Details  Name: Travis CAIN Sr. MRN: [id number] Date of Birth: [birth year] Referring Provider (PT): [name] MD   Encounter Date: 12/30/2020   PT End of Session - 12/30/20 0926    Visit Number 3    Number of Visits 6    Date for PT Re-Evaluation 01/21/21    PT Start Time 0848    PT Stop Time 0930    PT Time Calculation (min) 42 min    Activity Tolerance Patient tolerated treatment well;No increased pain    Behavior During Therapy WFL for tasks assessed/performed           Past Medical History:  Diagnosis Date  . Bulging lumbar disc    multiple levels  . Diabetes mellitus type 2 with complications ([place])   . ED (erectile dysfunction)   . Gout   . History of adenomatous polyp of colon   . Hyperlipidemia   . Hypertension   . Left carotid artery stenosis <50%  . Left sciatic nerve pain   . Lumbar stenosis   . Nocturia   . Prostate cancer [facility]) urologist-  dr wrenn/ oncologist-  dr Tammi Klippel   dx 01-30-2017 w/ Gleason 3+3, PSA 4.59, active survillance/  biopsy 02-06-2018 , Stage T1c, Gleason 3+4, PSA 7.08-- scheduled for radioactive seed implants  . Thyroid mass    see [place] 8/20  . Wears glasses     Past Surgical History:  Procedure Laterality Date  . CYSTOSCOPY  06/20/2018   Procedure: CYSTOSCOPY FLEXIBLE;  Surgeon: Irine Seal, MD;  Location: [facility];  Service: Urology;;  NO SEEDS FOUND IN BLADDER  . NO PAST SURGERIES    . PROSTATE BIOPSY  01-30-2017;  02-06-2018  dr Jeffie Pollock office  . RADIOACTIVE SEED IMPLANT N/A 06/20/2018   Procedure: RADIOACTIVE SEED IMPLANT/BRACHYTHERAPY IMPLANT;  Surgeon: Irine Seal, MD;  Location: [facility];  Service: Urology;  Laterality: N/A;   86   SEEDS IMPLANTED  . SPACE OAR INSTILLATION N/A 06/20/2018   Procedure: SPACE OAR  INSTILLATION;  Surgeon: Irine Seal, MD;  Location: [facility];  Service: Urology;  Laterality: N/A;    There were no vitals filed for this visit.   Subjective Assessment - 12/30/20 0908    Subjective Travis Singh reports continued progress with his hand AROM, strength and pain.  Swelling still limits all 3 and he takes tylenol 3-4X/week.    Limitations House hold activities;Lifting    Patient Stated Goals Be able to use his L hand normally again, reduce pain and swelling    Currently in Pain? Yes    Pain Score 5     Pain Location Hand    Pain Orientation Left    Pain Descriptors / Indicators Sore;Tightness    Pain Type Acute pain    Pain Radiating Towards NA    Pain Onset More than a month ago    Pain Frequency Constant    Aggravating Factors  Sore with grabbing and L UE use    Pain Relieving Factors Exercises    Effect of Pain on Daily Activities Unable to use L hand normally    Multiple Pain Sites No              [facility] PT Assessment - 12/30/20 0001      Observation/Other Assessments   Focus on Therapeutic Outcomes (FOTO)  60 (was 32, Goal 61)      ROM / Strength   AROM / PROM / Strength AROM;Strength      AROM   Overall AROM  Deficits    AROM Assessment Site Wrist    Right/Left Wrist Left    Right Wrist Extension 45 Degrees    Right Wrist Flexion 35 Degrees    Left Wrist Extension 50 Degrees    Left Wrist Flexion 55 Degrees      Strength   Overall Strength Deficits    Strength Assessment Site Hand    Right/Left hand Left;Right    Right Hand Grip (lbs) 55.3    Left Hand Grip (lbs) 12.1                         [facility] Adult PT Treatment/Exercise - 12/30/20 0001      Exercises   Exercises Hand;Wrist      Hand Exercises   MCPJ Flexion AROM;Left;10 reps   2 sets   MCPJ Extension AROM;Left;10 reps   2 sets   PIPJ Flexion AROM;Left;10 reps   2 sets   PIPJ Extension AROM;Left;10 reps   2 sets   DIPJ Flexion AROM;Left;10 reps   2 sets    DIPJ Extension AROM;Left;10 reps   4 Part Fisting for all hand AROM (Full hand extension; make a claw-PIP flexion; make a fist-DIP flexion; MCP flexion)   DIPJ Extension Limitations 2 sets    Thumb Opposition 20X to each finger    Other Hand Exercises Marbles in hand webbing moving marbles with thumb and ring/pinky    Other Hand Exercises UBE 5 minutes (alternate push/pull)      Wrist Exercises   Wrist Flexion --   2 minutes of wrist flexion and extension AROM along with 20X slow and controlled active wrist flexion and extension 20X with 2#   Wrist Extension --   2 minutes of wrist flexion and extension AROM and 20X slow and controlled flexion and extension with 2#   Other wrist exercises Pronation/Supination grabbing the bell of a 1# weight 20X controlled, slow motion    Other wrist exercises Hand webbing (each individual finger to thumb in yellow 10X)                  PT Education - 12/30/20 0912    Education Details Reviewed HEP and reassessment findings.    Person(s) Educated Patient    Methods Explanation;Demonstration;Verbal cues    Comprehension Verbalized understanding;Returned demonstration;Verbal cues required;Need further instruction            PT Short Term Goals - 12/30/20 0924      PT SHORT TERM GOAL #1   Title Travis Singh will be able to make a fist.    Baseline Unable at evaluation, improving at 12/30/2020 visit.    Time 4    Period Weeks    Status On-going    Target Date 01/07/21             PT Long Term Goals - 12/30/20 0925      PT LONG TERM GOAL #1   Title Improve FOTO score to 61.    Baseline 32    Time 6    Period Weeks    Status On-going      PT LONG TERM GOAL #2   Title Travis Singh will have L wrist AROM at 90% or greater compared to the uninvolved R.    Baseline 9.5% at evaluation,  76% at 12/30/2020 visit    Time 6    Period Weeks    Status On-going      PT LONG TERM GOAL #3   Title Travis Singh will have at least 50 pounds of L grip strength.     Baseline < 10 pounds at evaluation, > 12 at 12/30/2020 reassessment    Time 6    Period Weeks    Status On-going      PT LONG TERM GOAL #4   Title Travis Singh will return to work without restrictions or L hand pain at DC.    Time 6    Period Weeks    Status On-going                 Plan - 12/30/20 0927    Clinical Impression Statement Travis Singh notes continued AROM, strength and pain progress with his L hand.  L wrist AROM is now 76% of the uninvolved R (was < 10%).  Grip strength is better but still poor.  I recommend Travis Singh continue supervised PT 1X/week to complement his HEP to facilitate meeting LTGs establsihed at evaluation.    Examination-Activity Limitations Carry;Other   Limits L hand use at work   Examination-Participation Restrictions Occupation;Community Activity    Stability/Clinical Decision Making Stable/Uncomplicated    Rehab Potential Good    PT Frequency 1x / week    PT Duration 4 weeks    PT Treatment/Interventions ADLs/Self Care Home Management;Cryotherapy;Therapeutic activities;Therapeutic exercise;Neuromuscular re-education;Patient/family education;Manual techniques;Compression bandaging;Passive range of motion;Vasopneumatic Device    PT Next Visit Plan Review basic wrist and hand AROM.  Add some grip and functional strength activities.  Possibly ice or vaso to help with pain and swelling.    PT Home Exercise Plan Access Code: [email or web address]  URL: [email or web address]  Date: 12/10/2020  Prepared by: Vista Mink    Exercises  Wrist Flexion Extension AROM with Fingers Curled and Palm Down - 5 x daily - 7 x weekly - 1 sets - 1 reps    Consulted and Agree with Plan of Care Patient           Patient will benefit from skilled therapeutic intervention in order to improve the following deficits and impairments:  Decreased activity tolerance,Decreased endurance,Decreased range of motion,Decreased strength,Increased edema,Impaired flexibility,Impaired UE  functional use,Pain  Visit Diagnosis: Localized edema  Stiffness of left hand, not elsewhere classified  Stiffness of left wrist, not elsewhere classified  Pain in left hand  Muscle weakness (generalized)     Problem List Patient Active Problem List   Diagnosis Date Noted  . Diabetes mellitus type 2 with complications ([place])   . Thyroid mass   . Left carotid artery stenosis   . Prostate cancer ([place])   . Right sided sciatica   . Other and unspecified hyperlipidemia 09/23/2013  . Sciatica 09/23/2013  . Hypertension     Farley Ly PT, MPT 12/30/2020, 9:37 AM  [facility] Physical Therapy [address] [place], [place], [address] Phone: [phone]   Fax:  [phone]  Name: BREES HOUNSHELL Sr. MRN: [id number] Date of Birth: 10-20-1945

## 2021-01-06 ENCOUNTER — Ambulatory Visit (INDEPENDENT_AMBULATORY_CARE_PROVIDER_SITE_OTHER): Payer: Medicare Other | Admitting: Rehabilitative and Restorative Service Providers"

## 2021-01-06 ENCOUNTER — Encounter: Payer: Self-pay | Admitting: Rehabilitative and Restorative Service Providers"

## 2021-01-06 ENCOUNTER — Other Ambulatory Visit: Payer: Self-pay

## 2021-01-06 DIAGNOSIS — M79642 Pain in left hand: Secondary | ICD-10-CM

## 2021-01-06 DIAGNOSIS — M25642 Stiffness of left hand, not elsewhere classified: Secondary | ICD-10-CM | POA: Diagnosis not present

## 2021-01-06 DIAGNOSIS — R600 Localized edema: Secondary | ICD-10-CM | POA: Diagnosis not present

## 2021-01-06 DIAGNOSIS — M25632 Stiffness of left wrist, not elsewhere classified: Secondary | ICD-10-CM | POA: Diagnosis not present

## 2021-01-06 DIAGNOSIS — M6281 Muscle weakness (generalized): Secondary | ICD-10-CM | POA: Diagnosis not present

## 2021-01-06 NOTE — Therapy (Signed)
[facility] Physical Therapy [address] [place], [place], [address] Phone: [phone]   Fax:  [phone]  Physical Therapy Treatment  Patient Details  Name: Travis KEENA Sr. MRN: [id number] Date of Birth: 18-Feb-1946 Referring Provider (PT): Travis [name] MD   Encounter Date: 01/06/2021   PT End of Session - 01/06/21 1534    Visit Number 4    Number of Visits 6    Date for PT Re-Evaluation 01/21/21    PT Start Time 1856    PT Stop Time 0934    PT Time Calculation (min) 40 min    Activity Tolerance Patient tolerated treatment well;No increased pain    Behavior During Therapy WFL for tasks assessed/performed           Past Medical History:  Diagnosis Date  . Bulging lumbar disc    multiple levels  . Diabetes mellitus type 2 with complications ([place])   . ED (erectile dysfunction)   . Gout   . History of adenomatous polyp of colon   . Hyperlipidemia   . Hypertension   . Left carotid artery stenosis <50%  . Left sciatic nerve pain   . Lumbar stenosis   . Nocturia   . Prostate cancer [facility]) urologist-  dr wrenn/ oncologist-  dr Tammi Klippel   dx 01-30-2017 w/ Gleason 3+3, PSA 4.59, active survillance/  biopsy 02-06-2018 , Stage T1c, Gleason 3+4, PSA 7.08-- scheduled for radioactive seed implants  . Thyroid mass    see [place] 8/20  . Wears glasses     Past Surgical History:  Procedure Laterality Date  . CYSTOSCOPY  06/20/2018   Procedure: CYSTOSCOPY FLEXIBLE;  Surgeon: Irine Seal, MD;  Location: [facility];  Service: Urology;;  NO SEEDS FOUND IN BLADDER  . NO PAST SURGERIES    . PROSTATE BIOPSY  01-30-2017;  02-06-2018  dr Travis Singh office  . RADIOACTIVE SEED IMPLANT N/A 06/20/2018   Procedure: RADIOACTIVE SEED IMPLANT/BRACHYTHERAPY IMPLANT;  Surgeon: Irine Seal, MD;  Location: [facility];  Service: Urology;  Laterality: N/A;   86   SEEDS IMPLANTED  . SPACE OAR INSTILLATION N/A 06/20/2018   Procedure: SPACE OAR  INSTILLATION;  Surgeon: Irine Seal, MD;  Location: [facility];  Service: Urology;  Laterality: N/A;    There were no vitals filed for this visit.   Subjective Assessment - 01/06/21 0912    Subjective Major reports continued progress with his hand AROM, strength and pain.  Swelling still limits all 3 and he takes tylenol 3-4X/week.  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    Limitations House hold activities;Lifting    Patient Stated Goals Be able to use his L hand normally again, reduce pain and swelling    Currently in Pain? No/denies    Pain Score 0-No pain    Pain Location Hand    Pain Orientation Left    Pain Radiating Towards NA    Pain Onset More than a month ago    Pain Frequency Intermittent    Aggravating Factors  Overuse    Pain Relieving Factors Exercises    Effect of Pain on Daily Activities Limits his L UE function    Multiple Pain Sites No              [facility] PT Assessment - 01/06/21 0001      Strength   Right Hand Grip (lbs) 59.8    Left Hand Grip (lbs) 20.7  [place] Adult PT Treatment/Exercise - 01/06/21 0001      Therapeutic Activites    Therapeutic Activities Other Therapeutic Activities    Other Therapeutic Activities Theraband IR/ER 10X Grren for grip      Exercises   Exercises Hand;Wrist      Hand Exercises   MCPJ Flexion AROM;Left;10 reps   2 sets   MCPJ Extension AROM;Left;10 reps   2 sets   PIPJ Flexion AROM;Left;10 reps   2 sets   PIPJ Extension AROM;Left;10 reps   2 sets   DIPJ Flexion AROM;Left;10 reps   2 sets   DIPJ Extension AROM;Left;10 reps   4 Part Fisting for all hand AROM (Full hand extension; make a claw-PIP flexion; make a fist-DIP flexion; MCP flexion)   DIPJ Extension Limitations 2 sets    Thumb Opposition 20X to each finger    Other Hand Exercises Marbles in hand webbing moving marbles with thumb and ring/pinky    Other Hand Exercises UBE 5  minutes (alternate push/pull)      Wrist Exercises   Wrist Flexion --   2 minutes of wrist flexion and extension AROM along with 20X slow and controlled active wrist flexion and extension 20X with 2#   Wrist Extension --   2 minutes of wrist flexion and extension AROM and 20X slow and controlled flexion and extension with 2#   Other wrist exercises Pronation/Supination grabbing the bell of a 1# weight 20X controlled, slow motion    Other wrist exercises Hand webbing (each individual finger to thumb in yellow 10X)                  PT Education - 01/06/21 1532    Education Details Reviewed HEP    Person(s) Educated Patient    Methods Explanation;Demonstration;Verbal cues    Comprehension Verbalized understanding;Returned demonstration;Verbal cues required;Need further instruction            PT Short Term Goals - 01/06/21 1532      PT SHORT TERM GOAL #1   Title Travis Singh will be able to make a fist.    Baseline Unable at evaluation, improving at 12/30/2020 visit.    Time 4    Period Weeks    Status On-going    Target Date 01/07/21             PT Long Term Goals - 01/06/21 1533      PT LONG TERM GOAL #1   Title Improve FOTO score to 61.    Baseline 60 12/30/20 (was 32 at evaluation)    Time 6    Period Weeks    Status On-going      PT LONG TERM GOAL #2   Title Travis Singh will have L wrist AROM at 90% or greater compared to the uninvolved R.    Baseline 9.5% at evaluation, 76% at 12/30/2020 visit    Time 6    Period Weeks    Status On-going      PT LONG TERM GOAL #3   Title Travis Singh will have at least 50 pounds of L grip strength.    Baseline < 10 pounds at evaluation, > 20 at 01/06/21 visit    Time 6    Period Weeks    Status On-going      PT LONG TERM GOAL #4   Title Travis Singh will return to work without restrictions or L hand pain at DC.    Baseline Better able to use equipment at work    Time  6    Period Weeks    Status On-going                 Plan -  01/06/21 1535    Clinical Impression Statement Travis Singh notes he is better able to use his L hand to operate equipment at work.  He is not able to make a fist but has better grip strength and L hand functional use.  Continue POC to meet LTGs.    Examination-Activity Limitations Carry;Other   Limits L hand use at work   Examination-Participation Restrictions Occupation;Community Activity    Stability/Clinical Decision Making Stable/Uncomplicated    Rehab Potential Good    PT Frequency 1x / week    PT Duration 4 weeks    PT Treatment/Interventions ADLs/Self Care Home Management;Cryotherapy;Therapeutic activities;Therapeutic exercise;Neuromuscular re-education;Patient/family education;Manual techniques;Compression bandaging;Passive range of motion;Vasopneumatic Device    PT Next Visit Plan Review basic wrist and hand AROM.  Add some grip and functional strength activities.  Possibly ice or vaso to help with pain and swelling.    PT Home Exercise Plan Access Code: [email or web address]  URL: [email or web address]  Date: 12/10/2020  Prepared by: Vista Mink    Exercises  Wrist Flexion Extension AROM with Fingers Curled and Palm Down - 5 x daily - 7 x weekly - 1 sets - 1 reps    Consulted and Agree with Plan of Care Patient           Patient will benefit from skilled therapeutic intervention in order to improve the following deficits and impairments:  Decreased activity tolerance,Decreased endurance,Decreased range of motion,Decreased strength,Increased edema,Impaired flexibility,Impaired UE functional use,Pain  Visit Diagnosis: Localized edema  Stiffness of left hand, not elsewhere classified  Stiffness of left wrist, not elsewhere classified  Pain in left hand  Muscle weakness (generalized)     Problem List Patient Active Problem List   Diagnosis Date Noted  . Diabetes mellitus type 2 with complications ([place])   . Thyroid mass   . Left carotid artery stenosis   . Prostate cancer  ([place])   . Right sided sciatica   . Other and unspecified hyperlipidemia 09/23/2013  . Sciatica 09/23/2013  . Hypertension     Farley Ly PT, MPT 01/06/2021, 3:37 PM  [facility] Physical Therapy [address] [place], [place], [address] Phone: [phone]   Fax:  [phone]  Name: KEDRICK MCNAMEE Sr. MRN: [id number] Date of Birth: [birth year]

## 2021-01-14 ENCOUNTER — Encounter: Payer: Self-pay | Admitting: Rehabilitative and Restorative Service Providers"

## 2021-01-14 ENCOUNTER — Ambulatory Visit (INDEPENDENT_AMBULATORY_CARE_PROVIDER_SITE_OTHER): Payer: Medicare Other | Admitting: Rehabilitative and Restorative Service Providers"

## 2021-01-14 ENCOUNTER — Other Ambulatory Visit: Payer: Self-pay

## 2021-01-14 DIAGNOSIS — R600 Localized edema: Secondary | ICD-10-CM

## 2021-01-14 DIAGNOSIS — M25632 Stiffness of left wrist, not elsewhere classified: Secondary | ICD-10-CM | POA: Diagnosis not present

## 2021-01-14 DIAGNOSIS — M25642 Stiffness of left hand, not elsewhere classified: Secondary | ICD-10-CM | POA: Diagnosis not present

## 2021-01-14 DIAGNOSIS — M79642 Pain in left hand: Secondary | ICD-10-CM

## 2021-01-14 DIAGNOSIS — M6281 Muscle weakness (generalized): Secondary | ICD-10-CM | POA: Diagnosis not present

## 2021-01-14 NOTE — Therapy (Addendum)
[facility] Physical Therapy [address] [place], [place], [address] Phone: [phone]   Fax:  [phone]  Physical Therapy Treatment  Patient Details  Name: Travis ATIENZA Sr. MRN: [id number] Date of Birth: 19-May-1946 Referring Provider (PT): Donita Brooks MD  PHYSICAL THERAPY DISCHARGE SUMMARY  Visits from [facility] of Care: 5  Current functional level related to goals / functional outcomes: See note   Remaining deficits: See note   Education / Equipment: HEP Plan:                                                    Patient goals were partially met. Patient is being discharged due to not returning since the last visit.  ?????     Encounter Date: 01/14/2021   PT End of Session - 01/14/21 0917    Visit Number 5    Number of Visits 6    Date for PT Re-Evaluation 01/21/21    PT Start Time 0847    PT Stop Time 0930    PT Time Calculation (min) 43 min    Activity Tolerance Patient tolerated treatment well;No increased pain    Behavior During Therapy WFL for tasks assessed/performed           Past Medical History:  Diagnosis Date  . Bulging lumbar disc    multiple levels  . Diabetes mellitus type 2 with complications ([facility])   . ED (erectile dysfunction)   . Gout   . History of adenomatous polyp of colon   . Hyperlipidemia   . Hypertension   . Left carotid artery stenosis <50%  . Left sciatic nerve pain   . Lumbar stenosis   . Nocturia   . Prostate cancer [facility]) urologist-  dr wrenn/ oncologist-  dr Kathrynn Running   dx 01-30-2017 w/ Gleason 3+3, PSA 4.59, active survillance/  biopsy 02-06-2018 , Stage T1c, Gleason 3+4, PSA 7.08-- scheduled for radioactive seed implants  . Thyroid mass    see [place] 8/20  . Wears glasses     Past Surgical History:  Procedure Laterality Date  . CYSTOSCOPY  06/20/2018   Procedure: CYSTOSCOPY FLEXIBLE;  Surgeon: Bjorn Pippin, MD;  Location: [facility];  Service: Urology;;  NO SEEDS FOUND IN BLADDER  . NO  PAST SURGERIES    . PROSTATE BIOPSY  01-30-2017;  02-06-2018  dr Annabell Howells office  . RADIOACTIVE SEED IMPLANT N/A 06/20/2018   Procedure: RADIOACTIVE SEED IMPLANT/BRACHYTHERAPY IMPLANT;  Surgeon: Bjorn Pippin, MD;  Location: [facility];  Service: Urology;  Laterality: N/A;   86   SEEDS IMPLANTED  . SPACE OAR INSTILLATION N/A 06/20/2018   Procedure: SPACE OAR INSTILLATION;  Surgeon: Bjorn Pippin, MD;  Location: [facility];  Service: Urology;  Laterality: N/A;    There were no vitals filed for this visit.   Subjective Assessment - 01/14/21 0851    Subjective Styles reports continued progress with his hand AROM, strength and pain.  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    Limitations House hold activities;Lifting    Patient Stated Goals Be able to use his L hand normally again, reduce pain and swelling    Currently in Pain? No/denies    Pain Score 0-No pain  Pain Location Hand    Pain Orientation Left    Pain Descriptors / Indicators Sore;Tightness    Pain Type Acute pain    Pain Radiating Towards NA    Pain Onset More than a month ago    Pain Frequency Intermittent    Aggravating Factors  Overuse    Pain Relieving Factors Exercises    Effect of Pain on Daily Activities Hank rates his L hand "75% functional"    Multiple Pain Sites No              [facility] PT Assessment - 01/14/21 0001      Strength   Left Hand Grip (lbs) 23.6                         [facility] Adult PT Treatment/Exercise - 01/14/21 0001      Therapeutic Activites    Therapeutic Activities Travis Therapeutic Activities    Travis Therapeutic Activities Theraband IR/ER 10X Grren for grip      Exercises   Exercises Hand;Wrist      Hand Exercises   MCPJ Flexion AROM;Left;10 reps   2 sets   MCPJ Extension AROM;Left;10 reps   2 sets   PIPJ Flexion AROM;Left;10 reps    2 sets   PIPJ Extension AROM;Left;10 reps   2 sets   DIPJ Flexion AROM;Left;10 reps   2 sets   DIPJ Extension AROM;Left;10 reps   4 Part Fisting for all hand AROM (Full hand extension; make a claw-PIP flexion; make a fist-DIP flexion; MCP flexion)   DIPJ Extension Limitations 2 sets    Thumb Opposition 20X to each finger    Travis Hand Exercises Marbles in hand webbing moving marbles with thumb and ring/pinky    Travis Hand Exercises UBE 5 minutes (alternate push/pull)      Wrist Exercises   Wrist Flexion --   2 minutes of wrist flexion and extension AROM along with 20X slow and controlled active wrist flexion and extension 20X with 3#   Wrist Extension --   2 minutes of wrist flexion and extension AROM and 20X slow and controlled flexion and extension with 3#   Travis wrist exercises Pronation/Supination grabbing the bell of a 3# weight 20X controlled, slow motion    Travis wrist exercises Hand webbing (each individual finger to thumb in green 10X)                  PT Education - 01/14/21 0916    Education Details Reviewed HEP and progressed clinic program    Person(s) Educated Patient    Methods Explanation;Demonstration;Verbal cues;Tactile cues    Comprehension Verbalized understanding;Tactile cues required;Returned demonstration;Verbal cues required;Need further instruction            PT Short Term Goals - 01/14/21 0916      PT SHORT TERM GOAL #1   Title Yohance will be able to make a fist.    Baseline Unable at evaluation, improving at 01/14/2021 visit.    Time 4    Period Weeks    Status On-going    Target Date 01/07/21             PT Long Term Goals - 01/14/21 0917      PT LONG TERM GOAL #1   Title Improve FOTO score to 61.    Baseline 60 12/30/20 (was 32 at evaluation)    Time 6    Period Weeks    Status On-going  PT LONG TERM GOAL #2   Title Trustin will have L wrist AROM at 90% or greater compared to the uninvolved R.    Baseline 9.5% at evaluation,  76% at 12/30/2020 visit    Time 6    Period Weeks    Status On-going      PT LONG TERM GOAL #3   Title Delaine will have at least 50 pounds of L grip strength.    Baseline < 10 pounds at evaluation, > 23 at 01/14/21 visit    Time 6    Period Weeks    Status On-going      PT LONG TERM GOAL #4   Title Coleman will return to work without restrictions or L hand pain at DC.    Baseline Better able to use equipment at work    Time [place]    Status On-going                 Plan - 01/14/21 0918    Clinical Impression Statement Hendrik notes continued progress with his L hand pain and swelling.  Function is also better and is subjectively "75% better."  Grip strength is also better, although still low at < 24 pounds (was < 10 at evaluation).  RA next week to assess progress and possible DC vs continued supervised PT.    Examination-Activity Limitations Carry;Travis   Limits L hand use at work   Examination-Participation Restrictions Occupation;Community Activity    Stability/Clinical Decision Making Stable/Uncomplicated    Rehab Potential Good    PT Frequency 1x / week    PT Duration 4 weeks    PT Treatment/Interventions ADLs/Self Care Home Management;Cryotherapy;Therapeutic activities;Therapeutic exercise;Neuromuscular re-education;Patient/family education;Manual techniques;Compression bandaging;Passive range of motion;Vasopneumatic Device    PT Next Visit Plan Review basic wrist and hand AROM.  Add some grip and functional strength activities.  Possibly ice or vaso to help with pain and swelling.    PT Home Exercise Plan Access Code: [email or web address]  URL: [email or web address]  Date: 12/10/2020  Prepared by: Vista Mink    Exercises  Wrist Flexion Extension AROM with Fingers Curled and Palm Down - 5 x daily - 7 x weekly - 1 sets - 1 reps    Consulted and Agree with Plan of Care Patient           Patient will benefit from skilled therapeutic intervention in order  to improve the following deficits and impairments:  Decreased activity tolerance,Decreased endurance,Decreased range of motion,Decreased strength,Increased edema,Impaired flexibility,Impaired UE functional use,Pain  Visit Diagnosis: Localized edema  Stiffness of left hand, not elsewhere classified  Stiffness of left wrist, not elsewhere classified  Pain in left hand  Muscle weakness (generalized)     Problem List Patient Active Problem List   Diagnosis Date Noted  . Diabetes mellitus type 2 with complications ([place])   . Thyroid mass   . Left carotid artery stenosis   . Prostate cancer ([place])   . Right sided sciatica   . Travis and unspecified hyperlipidemia 09/23/2013  . Sciatica 09/23/2013  . Hypertension     Farley Ly PT, MPT 01/14/2021, 9:31 AM  [facility] Physical Therapy [address] [place], [place], [address] Phone: [phone]   Fax:  [phone]  Name: Travis LAZCANO Sr. MRN: [id number] Date of Birth: 15-Feb-1946

## 2021-01-25 ENCOUNTER — Other Ambulatory Visit: Payer: Self-pay | Admitting: Family Medicine

## 2021-01-27 ENCOUNTER — Other Ambulatory Visit: Payer: Self-pay | Admitting: Family Medicine

## 2021-01-31 ENCOUNTER — Other Ambulatory Visit: Payer: Self-pay | Admitting: Family Medicine

## 2021-01-31 NOTE — Telephone Encounter (Signed)
Pt came into office to request a refill of  colchicine 0.6 MG tablet  Sent to the [facility] on [address].  Cb#: [phone]

## 2021-02-01 ENCOUNTER — Other Ambulatory Visit: Payer: Self-pay | Admitting: Family Medicine

## 2021-02-07 ENCOUNTER — Other Ambulatory Visit: Payer: Self-pay | Admitting: Family Medicine

## 2021-02-16 ENCOUNTER — Other Ambulatory Visit: Payer: Self-pay | Admitting: Family Medicine

## 2021-02-24 ENCOUNTER — Other Ambulatory Visit: Payer: Self-pay

## 2021-02-24 ENCOUNTER — Ambulatory Visit (INDEPENDENT_AMBULATORY_CARE_PROVIDER_SITE_OTHER): Payer: Medicare Other | Admitting: *Deleted

## 2021-02-24 VITALS — BP 124/84 | Temp 98.0°F | Ht 68.5 in | Wt 208.4 lb

## 2021-02-24 DIAGNOSIS — Z0000 Encounter for general adult medical examination without abnormal findings: Secondary | ICD-10-CM | POA: Diagnosis not present

## 2021-02-24 NOTE — Progress Notes (Signed)
Subjective:   Travis Singh is a 75 y.o. male who presents for Medicare Annual/Subsequent preventive examination.  Review of Systems    N/A Cardiac Risk Factors include: advanced age (>91men, >25 women);hypertension;dyslipidemia     Objective:    Today's Vitals   02/24/21 0934  BP: 124/84  Temp: 98 F (36.7 C)  TempSrc: Oral  Weight: 208 lb 6.4 oz (94.5 kg)  Height: 5' 8.5" (1.74 m)   Body mass index is 31.23 kg/m.  Advanced Directives 02/24/2021 02/24/2021 12/10/2020 06/20/2018  Does Patient Have a Medical Advance Directive? Yes Yes No No  Type of Paramedic of [place];Living will [place];Living will - -  Does patient want to make changes to medical advance directive? No - Patient declined No - Patient declined - -  Copy of [place] in Chart? No - copy requested - - -  Would patient like information on creating a medical advance directive? - - No - Patient declined No - Patient declined    Current Medications (verified) Outpatient Encounter Medications as of 02/24/2021  Medication Sig  . allopurinol (ZYLOPRIM) 300 MG tablet Take 1 tablet by mouth once daily  . amLODipine (NORVASC) 5 MG tablet Take 1 tablet (5 mg total) by mouth daily.  Marland Kitchen atorvastatin (LIPITOR) 40 MG tablet Take 1 tablet by mouth once daily  . colchicine 0.6 MG tablet TAKE 1 TABLET BY MOUTH ONCE DAILY . APPOINTMENT REQUIRED FOR FUTURE REFILLS  . hydrochlorothiazide (HYDRODIURIL) 25 MG tablet Take 1 tablet by mouth once daily  . lisinopril (ZESTRIL) 40 MG tablet Take 1 tablet by mouth once daily  . [DISCONTINUED] verapamil (CALAN-SR) 240 MG CR tablet Take 1 tablet by mouth once daily   No facility-administered encounter medications on file as of 02/24/2021.    Allergies (verified) Patient has no known allergies.   History: Past Medical History:  Diagnosis Date  . Bulging lumbar disc    multiple levels  . Diabetes mellitus type 2 with  complications ([place])   . ED (erectile dysfunction)   . Gout   . History of adenomatous polyp of colon   . Hyperlipidemia   . Hypertension   . Left carotid artery stenosis <50%  . Left sciatic nerve pain   . Lumbar stenosis   . Nocturia   . Prostate cancer [facility]) urologist-  dr wrenn/ oncologist-  dr Tammi Klippel   dx 01-30-2017 w/ Gleason 3+3, PSA 4.59, active survillance/  biopsy 02-06-2018 , Stage T1c, Gleason 3+4, PSA 7.08-- scheduled for radioactive seed implants  . Thyroid mass    see [place] 8/20  . Wears glasses    Past Surgical History:  Procedure Laterality Date  . CYSTOSCOPY  06/20/2018   Procedure: CYSTOSCOPY FLEXIBLE;  Surgeon: Irine Seal, MD;  Location: [facility];  Service: Urology;;  NO SEEDS FOUND IN BLADDER  . NO PAST SURGERIES    . PROSTATE BIOPSY  01-30-2017;  02-06-2018  dr Jeffie Pollock office  . RADIOACTIVE SEED IMPLANT N/A 06/20/2018   Procedure: RADIOACTIVE SEED IMPLANT/BRACHYTHERAPY IMPLANT;  Surgeon: Irine Seal, MD;  Location: [facility];  Service: Urology;  Laterality: N/A;   86   SEEDS IMPLANTED  . SPACE OAR INSTILLATION N/A 06/20/2018   Procedure: SPACE OAR INSTILLATION;  Surgeon: Irine Seal, MD;  Location: [facility];  Service: Urology;  Laterality: N/A;   Family History  Problem Relation Age of Onset  . Pancreatic cancer Mother   . Leukemia  Father   . Lung cancer Brother   . Lung cancer Brother   . Lung cancer Brother    Social History   Socioeconomic History  . Marital status: Married    Spouse name: Not on file  . Number of children: Not on file  . Years of education: Not on file  . Highest education level: Not on file  Occupational History  . Occupation: Nature conservation officer  Tobacco Use  . Smoking status: Current Some Day Smoker    Types: Cigars  . Smokeless tobacco: Never Used  . Tobacco comment: 06-13-2018  per pt every once in a while , cigar  Vaping Use  . Vaping Use: Never used  Substance and Sexual  Activity  . Alcohol use: Not Currently    Alcohol/week: 7.0 standard drinks    Types: 7 Cans of beer per week  . Drug use: No  . Sexual activity: Not Currently  Other Topics Concern  . 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    Social Determinants of Health   Financial Resource Strain: Low Risk   . Difficulty of Paying Living Expenses: Not hard at all  Food Insecurity: No Food Insecurity  . Worried About Charity fundraiser in the Last Year: Never true  . Ran Out of Food in the Last Year: Never true  Transportation Needs: No Transportation Needs  . Lack of Transportation (Medical): No  . Lack of Transportation (Non-Medical): No  Physical Activity: Not on file  Stress: Not on file  Social Connections: Unknown  . Frequency of Communication with Friends and Family: More than three times a week  . Frequency of Social Gatherings with Friends and Family: Three times a week  . Attends Religious Services: More than 4 times per year  . Active Member of Clubs or Organizations: No  . Attends Archivist Meetings: Never  . Marital Status: Not on file    Tobacco Counseling Ready to quit: Not Answered Counseling given: Not Answered Comment: 06-13-2018  per pt every once in a while , cigar   Clinical Intake:    Patient Care Team: [name], MD as PCP - General (Family Medicine)  Indicate any recent Medical Services you may have received from other than [facility] providers in the past year (date may be approximate).     Assessment:   This is a routine wellness examination for [name].  Hearing/Vision screen [facility]  Dietary issues and exercise activities discussed: Current Exercise Habits: Home exercise routine, Type of exercise: walking, Time (Minutes): 25, Frequency (Times/Week): 6, Weekly Exercise (Minutes/Week): 150, Intensity: Moderate  Goals Addressed            This Visit's  Progress   . Patient Stated       10 lb weight loss      Depression Screen PHQ 2/9 Scores 05/15/2019 03/13/2018 11/14/2016 01/21/2016  PHQ - 2 Score 0 0 0 0  PHQ- 9 Score - - - 0    Fall Risk Fall Risk  02/24/2021 05/15/2019 04/25/2018 03/13/2018 11/14/2016  Falls in the past year? 0 0 No No No  Comment - - Emmi Telephone Survey: data to providers prior to load - -  Number falls in past yr: 0 - - - -  Injury with Fall? 0 - - - -  Risk for fall due to : No Fall Risks - - - -  Follow up Falls  prevention discussed;Falls evaluation completed Falls evaluation completed - - -    FALL RISK PREVENTION PERTAINING TO THE HOME:  Any stairs in or around the home? No  If so, are there any without handrails? Yes  Home free of loose throw rugs in walkways, pet beds, electrical cords, etc? Yes  Adequate lighting in your home to reduce risk of falls? Yes   ASSISTIVE DEVICES UTILIZED TO PREVENT FALLS:  Life alert? No  Use of a cane, walker or w/c? No  Grab bars in the bathroom? Yes  Shower chair or bench in shower? No  Elevated toilet seat or a handicapped toilet? No   TIMED UP AND GO:  Was the test performed? Yes .  Length of time to ambulate 10 feet: 20 sec.   Gait steady and fast without use of assistive device  Cognitive Function:        Immunizations Immunization History  Administered Date(s) Administered  . Fluad Quad(high Dose 65+) 05/15/2019, 07/08/2020  . Influenza, High Dose Seasonal PF 07/04/2017, 08/01/2018  . PFIZER(Purple Top)SARS-COV-2 Vaccination 08/02/2020  . Pneumococcal Conjugate-13 02/12/2015  . Pneumococcal Polysaccharide-23 01/20/2011    TDAP status: Due, Education has been provided regarding the importance of this vaccine. Advised may receive this vaccine at local pharmacy or Health Dept. Aware to provide a copy of the vaccination record if obtained from local pharmacy or Health Dept. Verbalized acceptance and understanding.  Flu Vaccine status: Up to  date  Pneumococcal vaccine status: Up to date  Covid-19 vaccine status: Completed vaccines  Qualifies for Shingles Vaccine? Yes   Zostavax completed No   Shingrix Completed?: No.    Education has been provided regarding the importance of this vaccine. Patient has been advised to call insurance company to determine out of pocket expense if they have not yet received this vaccine. Advised may also receive vaccine at local pharmacy or Health Dept. Verbalized acceptance and understanding.  Screening Tests Health Maintenance  Topic Date Due  . OPHTHALMOLOGY EXAM  Never done  . Hepatitis C Screening  Never done  . TETANUS/TDAP  Never done  . Zoster Vaccines- Shingrix (1 of 2) Never done  . FOOT EXAM  10/13/2016  . COVID-19 Vaccine (2 - Pfizer risk 4-dose series) 08/23/2020  . HEMOGLOBIN A1C  02/10/2021  . INFLUENZA VACCINE  04/25/2021  . PNA vac Low Risk Adult  Completed  . HPV VACCINES  Aged Out    Health Maintenance  Health Maintenance Due  Topic Date Due  . OPHTHALMOLOGY EXAM  Never done  . Hepatitis C Screening  Never done  . TETANUS/TDAP  Never done  . Zoster Vaccines- Shingrix (1 of 2) Never done  . FOOT EXAM  10/13/2016  . COVID-19 Vaccine (2 - Pfizer risk 4-dose series) 08/23/2020  . HEMOGLOBIN A1C  02/10/2021    Colorectal cancer screening: No longer required.   Lung Cancer Screening: (Low Dose CT Chest recommended if Age 23-80 years, 30 pack-year currently smoking OR have quit w/in 15years.) does qualify.   Lung Cancer Screening Referral: patient declined  Additional Screening:  Hepatitis C Screening: does not qualify; Completed   Vision Screening: Recommended annual ophthalmology exams for early detection of glaucoma and other disorders of the eye. Is the patient up to date with their annual eye exam?  Yes  Who is the provider or what is the name of the office in which the patient attends annual eye exams? [facility]  If pt is not established with a  provider, would  they like to be referred to a provider to establish care? Yes .   Dental Screening: Recommended annual dental exams for proper oral hygiene  Community Resource Referral / Chronic Care Management: CRR required this visit?  No   CCM required this visit?  No      Plan:     I have personally reviewed and noted the following in the patient's chart:   . Medical and social history . Use of alcohol, tobacco or illicit drugs  . Current medications and supplements including opioid prescriptions. Patient is not currently taking opioid prescriptions. . Functional ability and status . Nutritional status . Physical activity . Advanced directives . List of other physicians . Hospitalizations, surgeries, and ER visits in previous 12 months . Vitals . Screenings to include cognitive, depression, and falls . Referrals and appointments  In addition, I have reviewed and discussed with patient certain preventive protocols, quality metrics, and best practice recommendations. A written personalized care plan for preventive services as well as general preventive health recommendations were provided to patient.     Leroy Kennedy, LPN   7/0/7615   Nurse Notes: None

## 2021-02-24 NOTE — Patient Instructions (Addendum)
Mr. Travis Singh , Thank you for taking time to come for your Medicare Wellness Visit. I appreciate your ongoing commitment to your health goals. Please review the following plan we discussed and let me know if I can assist you in the future.   Screening recommendations/referrals: Colonoscopy: No longer required  Recommended yearly ophthalmology/optometry visit for glaucoma screening and checkup Recommended yearly dental visit for hygiene and checkup  Vaccinations: Influenza vaccine: Up to date next due fall 2022  Pneumococcal vaccine: Completed series  Tdap vaccine: Up to date,you may await injury to receive  Shingles vaccine: Currently due, education provided    Advanced directives: Please bring in a copy so that we may scan into your chart   Conditions/risks identified: None   Next appointment: 03/02/2022 @ 9:00 am with Nurse Health advisor   Preventive Care 27 Years and Older, Male Preventive care refers to lifestyle choices and visits with your health care provider that can promote health and wellness. What does preventive care include?  A yearly physical exam. This is also called an annual well check.  Dental exams once or twice a year.  Routine eye exams. Ask your health care provider how often you should have your eyes checked.  Personal lifestyle choices, including:  Daily care of your teeth and gums.  Regular physical activity.  Eating a healthy diet.  Avoiding tobacco and drug use.  Limiting alcohol use.  Practicing safe sex.  Taking low doses of aspirin every day.  Taking vitamin and mineral supplements as recommended by your health care provider. What happens during an annual well check? The services and screenings done by your health care provider during your annual well check will depend on your age, overall health, lifestyle risk factors, and family history of disease. Counseling  Your health care provider may ask you questions about your:  Alcohol  use.  Tobacco use.  Drug use.  Emotional well-being.  Home and relationship well-being.  Sexual activity.  Eating habits.  History of falls.  Memory and ability to understand (cognition).  Work and work Statistician. Screening  You may have the following tests or measurements:  Height, weight, and BMI.  Blood pressure.  Lipid and cholesterol levels. These may be checked every 5 years, or more frequently if you are over 53 years old.  Skin check.  Lung cancer screening. You may have this screening every year starting at age 41 if you have a 30-pack-year history of smoking and currently smoke or have quit within the past 15 years.  Fecal occult blood test (FOBT) of the stool. You may have this test every year starting at age 41.  Flexible sigmoidoscopy or colonoscopy. You may have a sigmoidoscopy every 5 years or a colonoscopy every 10 years starting at age 65.  Prostate cancer screening. Recommendations will vary depending on your family history and other risks.  Hepatitis C blood test.  Hepatitis B blood test.  Sexually transmitted disease (STD) testing.  Diabetes screening. This is done by checking your blood sugar (glucose) after you have not eaten for a while (fasting). You may have this done every 1-3 years.  Abdominal aortic aneurysm (AAA) screening. You may need this if you are a current or former smoker.  Osteoporosis. You may be screened starting at age 76 if you are at high risk. Talk with your health care provider about your test results, treatment options, and if necessary, the need for more tests. Vaccines  Your health care provider may recommend certain vaccines, such  as:  Influenza vaccine. This is recommended every year.  Tetanus, diphtheria, and acellular pertussis (Tdap, Td) vaccine. You may need a Td booster every 10 years.  Zoster vaccine. You may need this after age 11.  Pneumococcal 13-valent conjugate (PCV13) vaccine. One dose is  recommended after age 36.  Pneumococcal polysaccharide (PPSV23) vaccine. One dose is recommended after age 64. Talk to your health care provider about which screenings and vaccines you need and how often you need them. This information is not intended to replace advice given to you by your health care provider. Make sure you discuss any questions you have with your health care provider. Document Released: 10/08/2015 Document Revised: 05/31/2016 Document Reviewed: 07/13/2015 Elsevier Interactive Patient Education  2017 [place] Prevention in the Home Falls can cause injuries. They can happen to people of all ages. There are many things you can do to make your home safe and to help prevent falls. What can I do on the outside of my home?  Regularly fix the edges of walkways and driveways and fix any cracks.  Remove anything that might make you trip as you walk through a door, such as a raised step or threshold.  Trim any bushes or trees on the path to your home.  Use bright outdoor lighting.  Clear any walking paths of anything that might make someone trip, such as rocks or tools.  Regularly check to see if handrails are loose or broken. Make sure that both sides of any steps have handrails.  Any raised decks and porches should have guardrails on the edges.  Have any leaves, snow, or ice cleared regularly.  Use sand or salt on walking paths during winter.  Clean up any spills in your garage right away. This includes oil or grease spills. What can I do in the bathroom?  Use night lights.  Install grab bars by the toilet and in the tub and shower. Do not use towel bars as grab bars.  Use non-skid mats or decals in the tub or shower.  If you need to sit down in the shower, use a plastic, non-slip stool.  Keep the floor dry. Clean up any water that spills on the floor as soon as it happens.  Remove soap buildup in the tub or shower regularly.  Attach bath mats  securely with double-sided non-slip rug tape.  Do not have throw rugs and other things on the floor that can make you trip. What can I do in the bedroom?  Use night lights.  Make sure that you have a light by your bed that is easy to reach.  Do not use any sheets or blankets that are too big for your bed. They should not hang down onto the floor.  Have a firm chair that has side arms. You can use this for support while you get dressed.  Do not have throw rugs and other things on the floor that can make you trip. What can I do in the kitchen?  Clean up any spills right away.  Avoid walking on wet floors.  Keep items that you use a lot in easy-to-reach places.  If you need to reach something above you, use a strong step stool that has a grab bar.  Keep electrical cords out of the way.  Do not use floor polish or wax that makes floors slippery. If you must use wax, use non-skid floor wax.  Do not have throw rugs and other things on the  floor that can make you trip. What can I do with my stairs?  Do not leave any items on the stairs.  Make sure that there are handrails on both sides of the stairs and use them. Fix handrails that are broken or loose. Make sure that handrails are as long as the stairways.  Check any carpeting to make sure that it is firmly attached to the stairs. Fix any carpet that is loose or worn.  Avoid having throw rugs at the top or bottom of the stairs. If you do have throw rugs, attach them to the floor with carpet tape.  Make sure that you have a light switch at the top of the stairs and the bottom of the stairs. If you do not have them, ask someone to add them for you. What else can I do to help prevent falls?  Wear shoes that:  Do not have high heels.  Have rubber bottoms.  Are comfortable and fit you well.  Are closed at the toe. Do not wear sandals.  If you use a stepladder:  Make sure that it is fully opened. Do not climb a closed  stepladder.  Make sure that both sides of the stepladder are locked into place.  Ask someone to hold it for you, if possible.  Clearly mark and make sure that you can see:  Any grab bars or handrails.  First and last steps.  Where the edge of each step is.  Use tools that help you move around (mobility aids) if they are needed. These include:  Canes.  Walkers.  Scooters.  Crutches.  Turn on the lights when you go into a dark area. Replace any light bulbs as soon as they burn out.  Set up your furniture so you have a clear path. Avoid moving your furniture around.  If any of your floors are uneven, fix them.  If there are any pets around you, be aware of where they are.  Review your medicines with your doctor. Some medicines can make you feel dizzy. This can increase your chance of falling. Ask your doctor what other things that you can do to help prevent falls. This information is not intended to replace advice given to you by your health care provider. Make sure you discuss any questions you have with your health care provider. Document Released: 07/08/2009 Document Revised: 02/17/2016 Document Reviewed: 10/16/2014 Elsevier Interactive Patient Education  2017 [employer].

## 2021-02-25 DIAGNOSIS — U071 COVID-19: Secondary | ICD-10-CM | POA: Diagnosis not present

## 2021-03-01 ENCOUNTER — Other Ambulatory Visit: Payer: Self-pay | Admitting: Family Medicine

## 2021-03-15 IMAGING — US US THYROID
1 series · 14 of 25 positions shown · non-contrast
Comparison: 05/22/2019 and 07/01/2019

CLINICAL DATA: Prior ultrasound follow-up. 74-year-old male with
history multinodular goiter.

EXAM:
THYROID ULTRASOUND
TECHNIQUE: Ultrasound examination of the thyroid gland and adjacent soft
tissues was performed.

[Series 1: us thyroid · 14 of 31 slices shown]
[im 1/31]
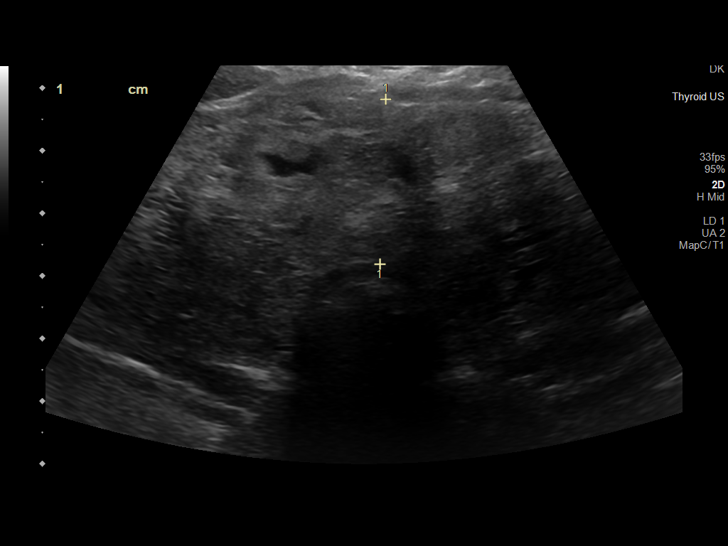
[im 3/31]
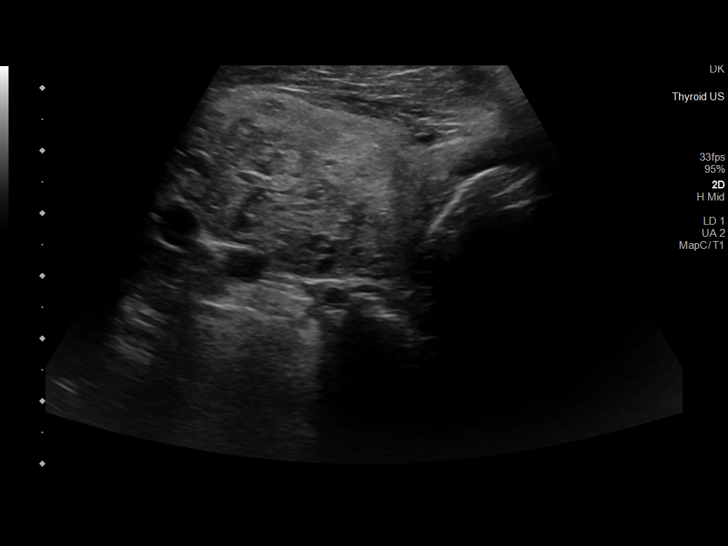
[im 6/31]
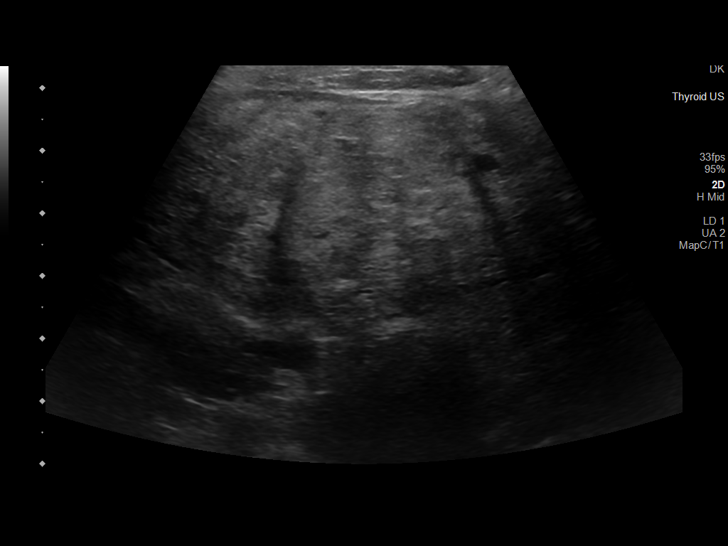
[im 8/31]
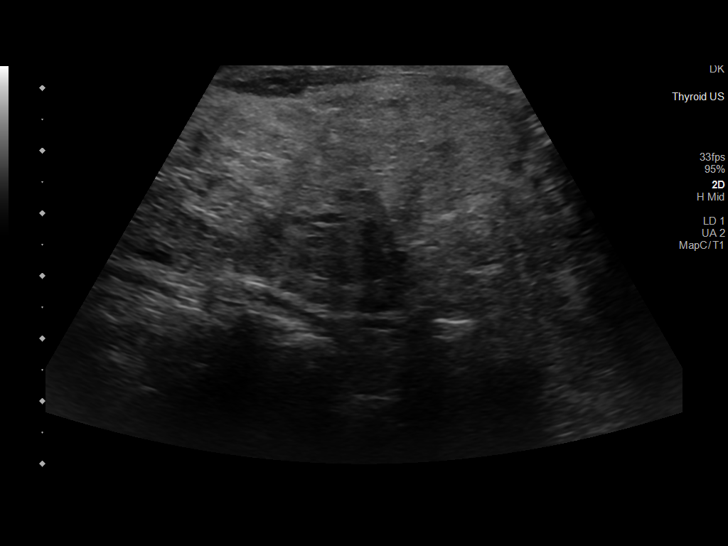
[im 11/31]
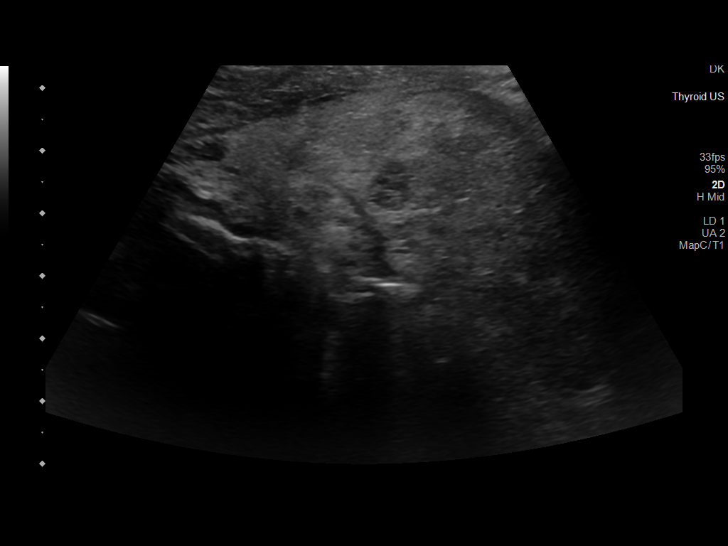
[im 12/31]
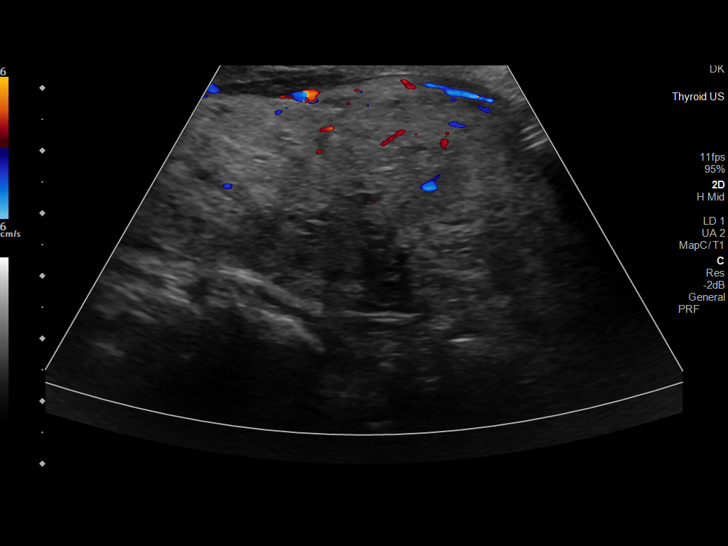
[im 14/31]
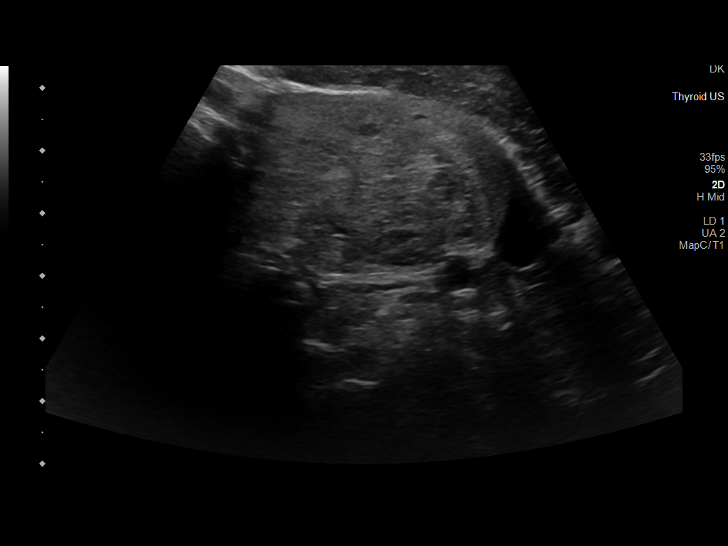
[im 17/31]
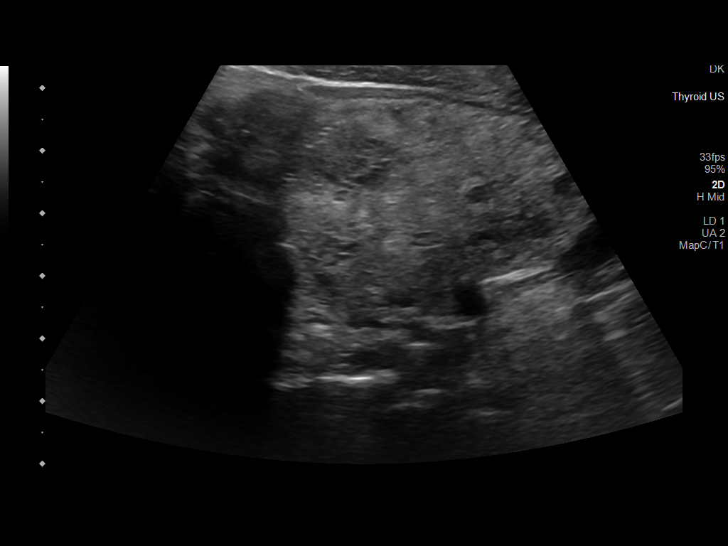
[im 19/31]
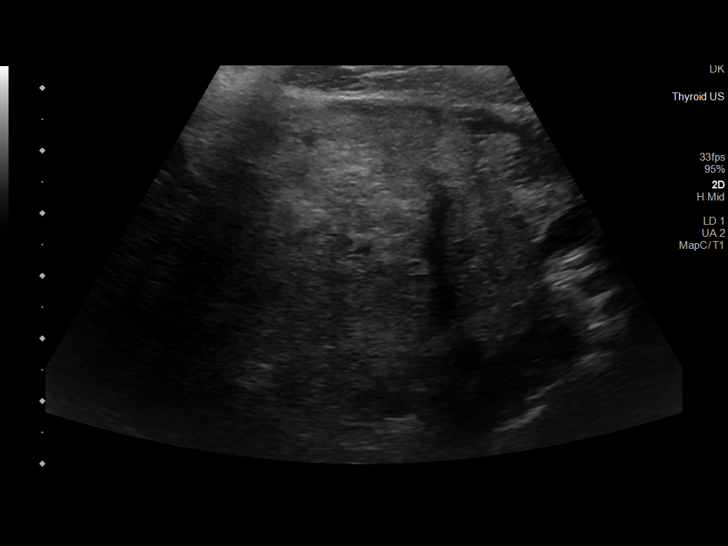
[im 21/31]
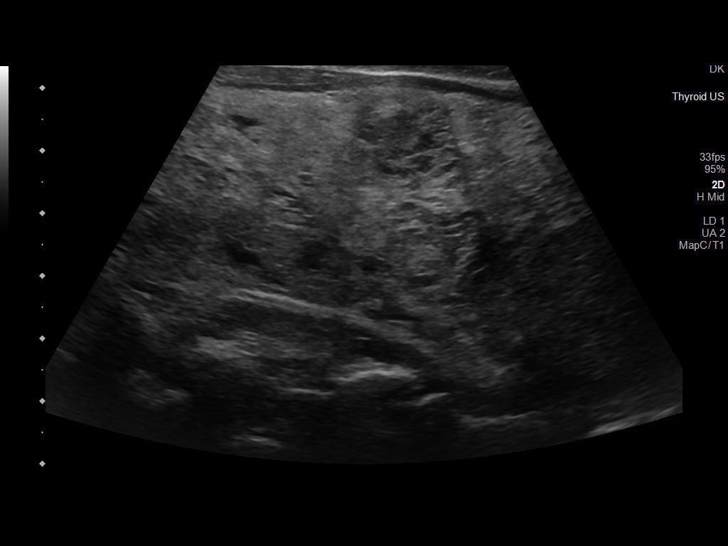
[im 23/31]
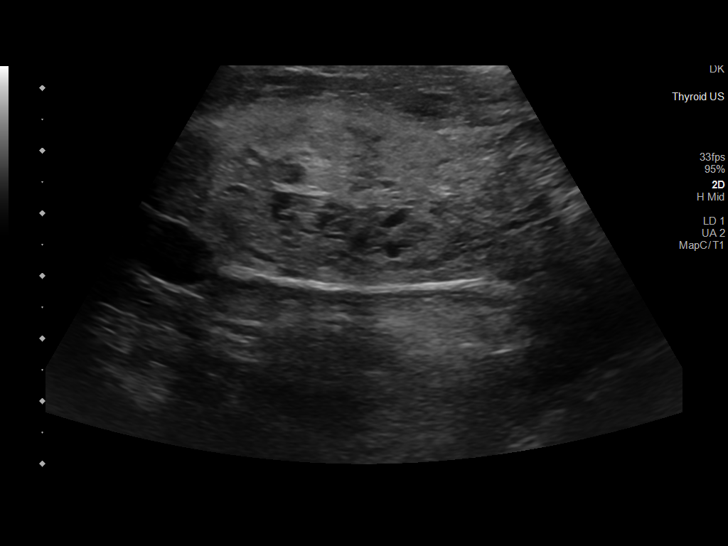
[im 26/31]
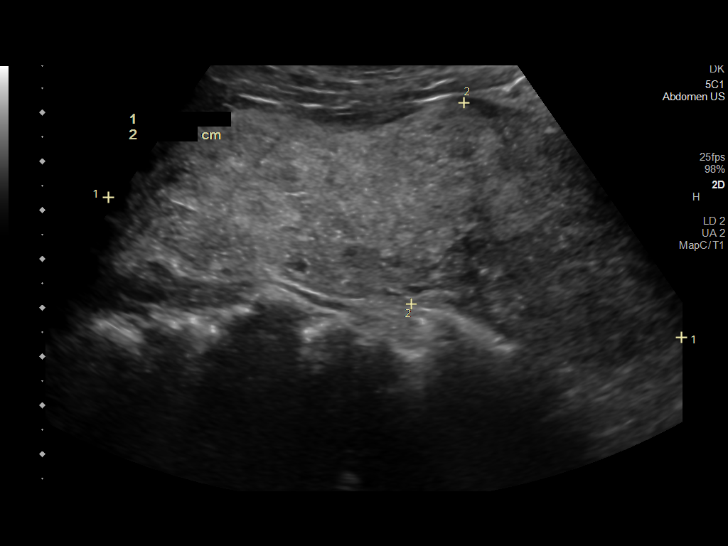
[im 28/31]
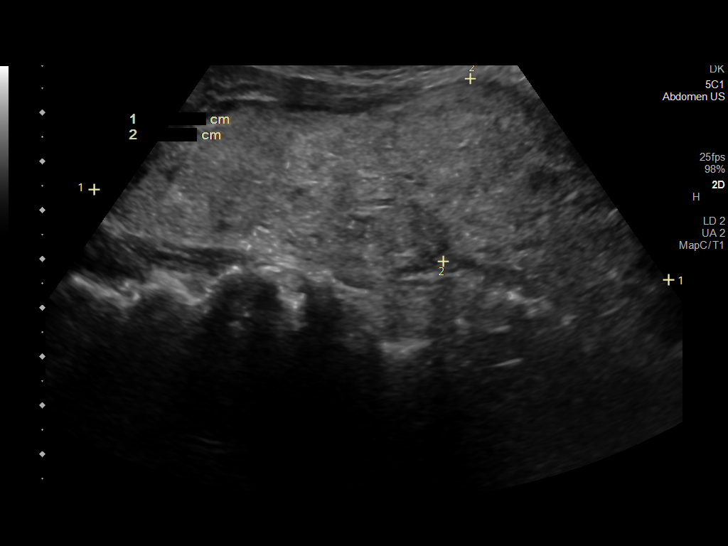
[im 31/31]
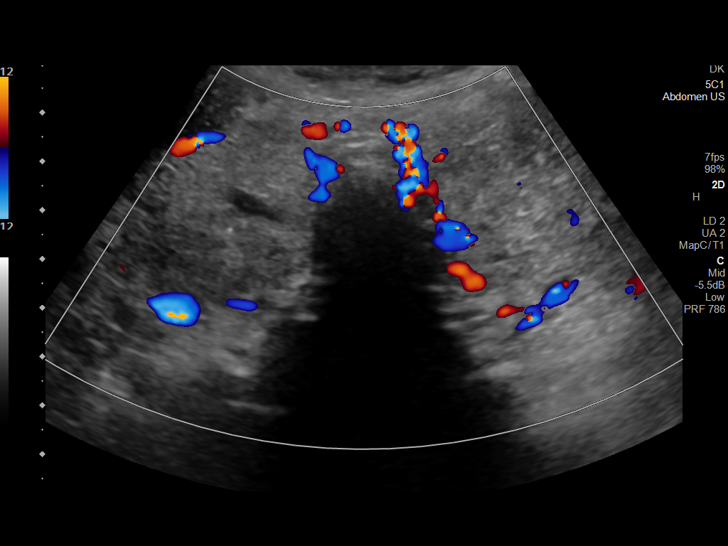

[14 of 25 positions shown; findings below may reference images not displayed]

FINDINGS: Parenchymal Echotexture: Markedly heterogenous

Isthmus: 2.6 cm, previously 2.3 cm

Right lobe: 12.1 x 4.3 x 4.8 cm, previously 8.2 x 4.8 x 4.8 cm

Left lobe: 11.9 x 3.8 x 5.1 cm, previously 8.5 x 3.5 x 4.7 cm

_________________________________________________________

Similar appearing diffusely enlarged in sonographically
heterogeneous thyroid gland. No discrete nodules are seen within the
thyroid gland.
IMPRESSION: Similar-appearing diffusely enlarged and heterogeneous thyroid gland
without discrete nodule.

## 2021-03-30 ENCOUNTER — Other Ambulatory Visit: Payer: Self-pay | Admitting: Family Medicine

## 2021-05-02 ENCOUNTER — Other Ambulatory Visit: Payer: Self-pay | Admitting: Family Medicine

## 2021-05-04 DIAGNOSIS — Z8546 Personal history of malignant neoplasm of prostate: Secondary | ICD-10-CM | POA: Diagnosis not present

## 2021-05-04 DIAGNOSIS — R351 Nocturia: Secondary | ICD-10-CM | POA: Diagnosis not present

## 2021-05-04 DIAGNOSIS — N5201 Erectile dysfunction due to arterial insufficiency: Secondary | ICD-10-CM | POA: Diagnosis not present

## 2021-05-09 ENCOUNTER — Other Ambulatory Visit: Payer: Self-pay | Admitting: Family Medicine

## 2021-05-24 ENCOUNTER — Other Ambulatory Visit: Payer: Self-pay | Admitting: Family Medicine

## 2021-05-31 ENCOUNTER — Other Ambulatory Visit: Payer: Self-pay | Admitting: Family Medicine

## 2021-06-29 ENCOUNTER — Other Ambulatory Visit: Payer: Self-pay | Admitting: Family Medicine

## 2021-07-13 ENCOUNTER — Other Ambulatory Visit: Payer: Self-pay

## 2021-07-13 ENCOUNTER — Ambulatory Visit: Payer: Medicare Other

## 2021-08-01 ENCOUNTER — Other Ambulatory Visit: Payer: Self-pay | Admitting: Family Medicine

## 2021-08-20 IMAGING — DX DG HAND COMPLETE 3+V*L*
3 series · 3 of 3 positions shown · non-contrast
Comparison: None.

CLINICAL DATA: Left hand pain and swelling after injury several
weeks ago.

EXAM:
LEFT HAND - COMPLETE 3+ VIEW

[dg hand complete left (1 of 3)]
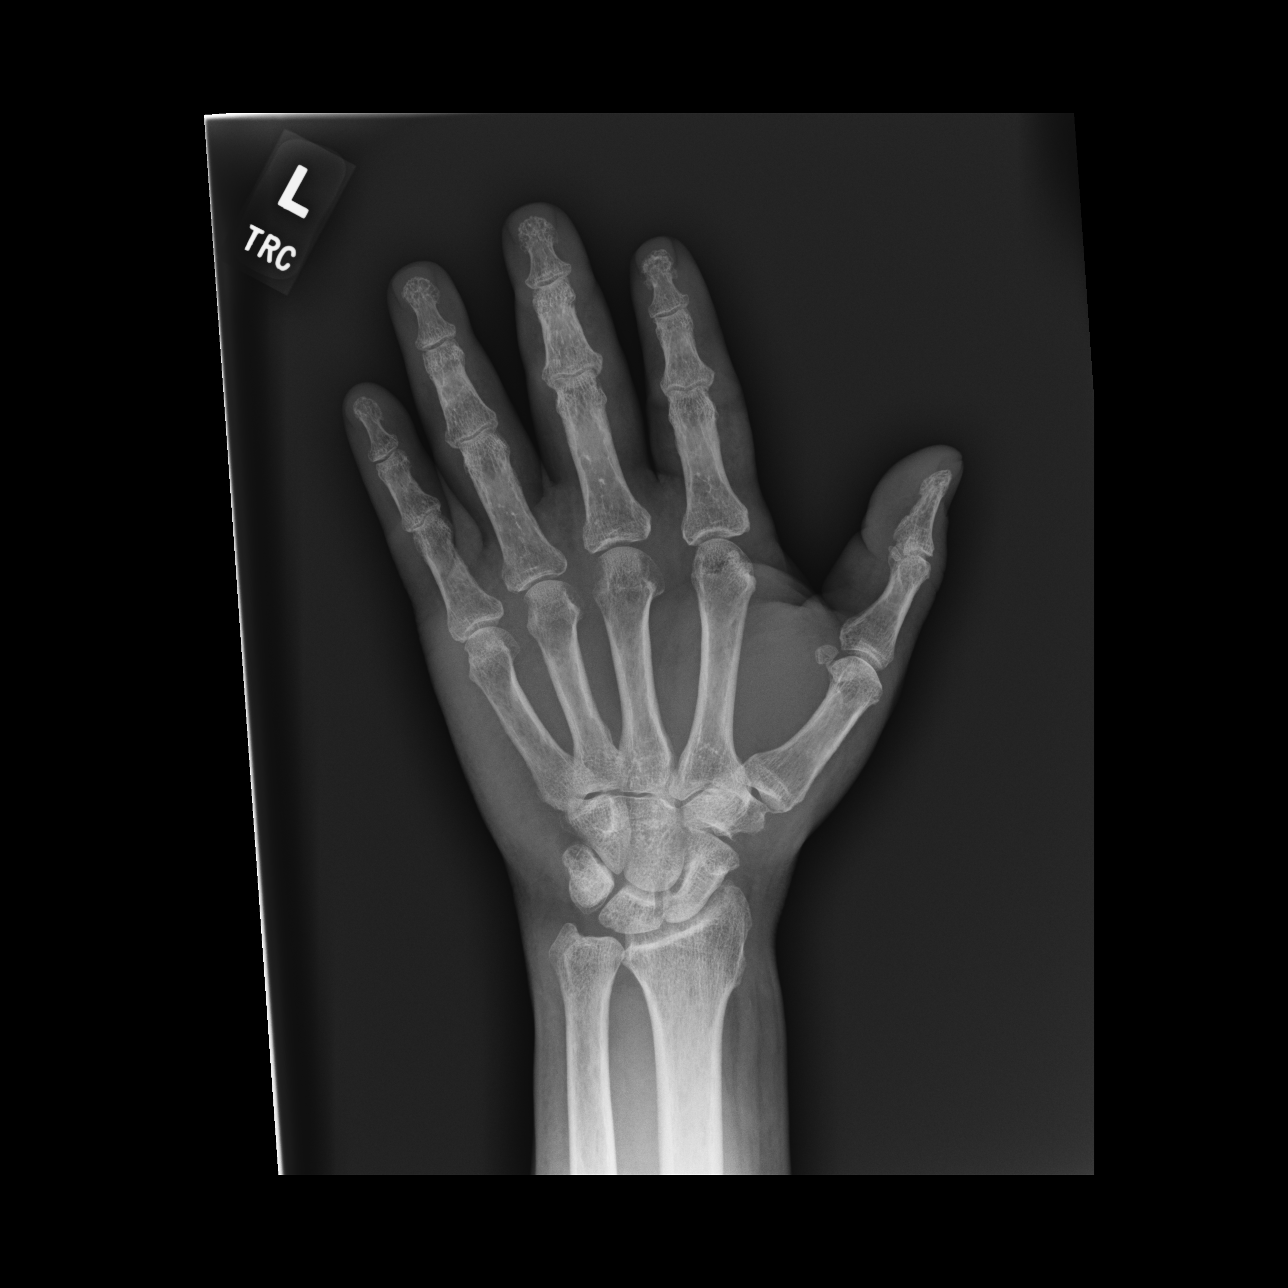

[dg hand complete left (2 of 3)]
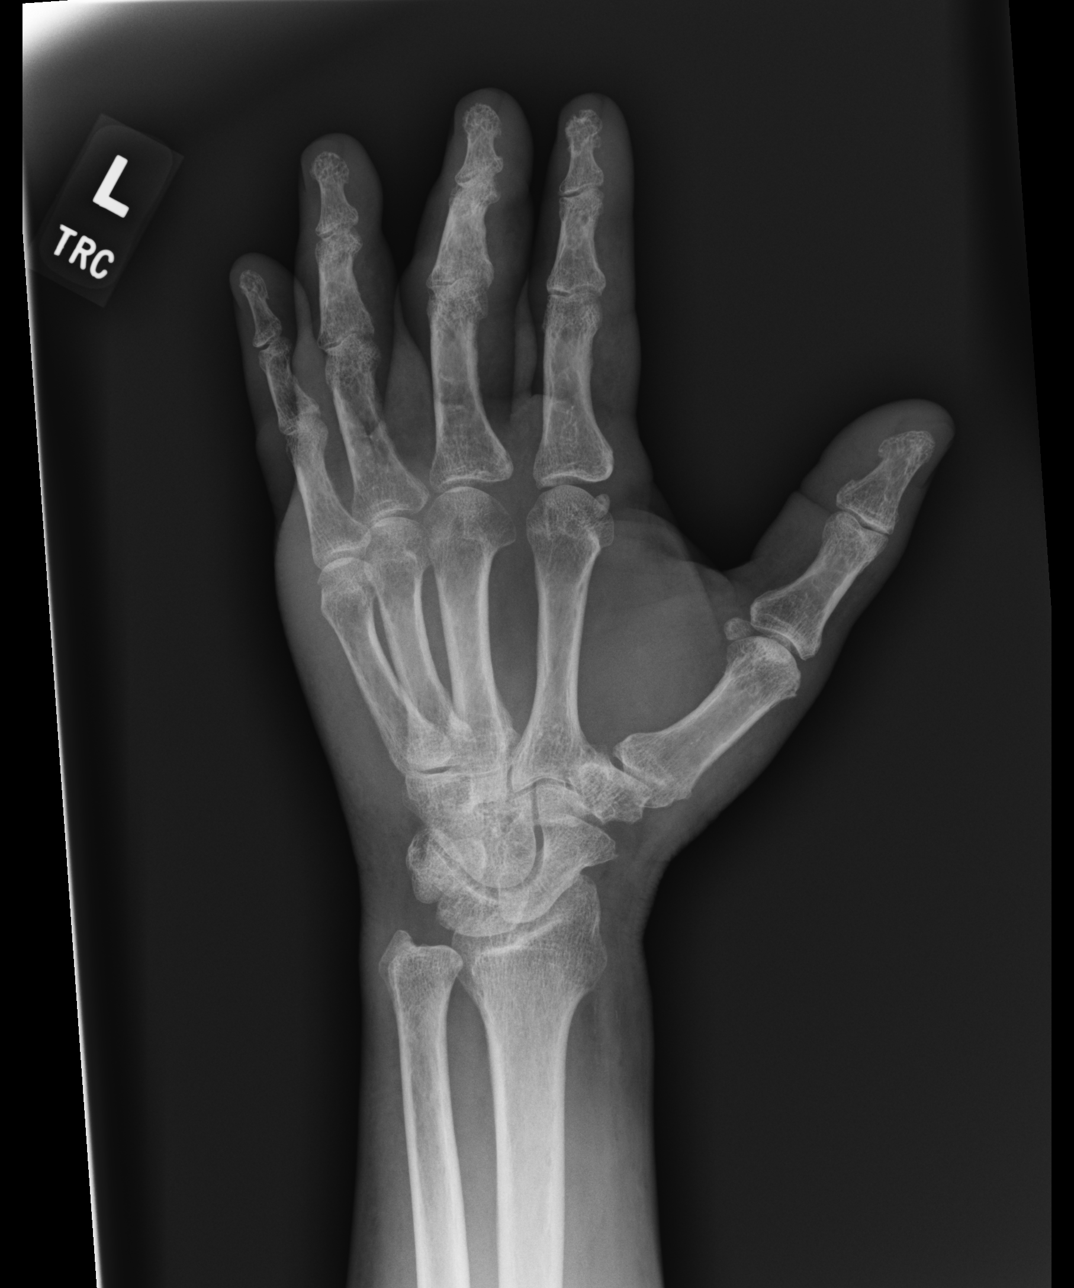

[dg hand complete left (3 of 3)]
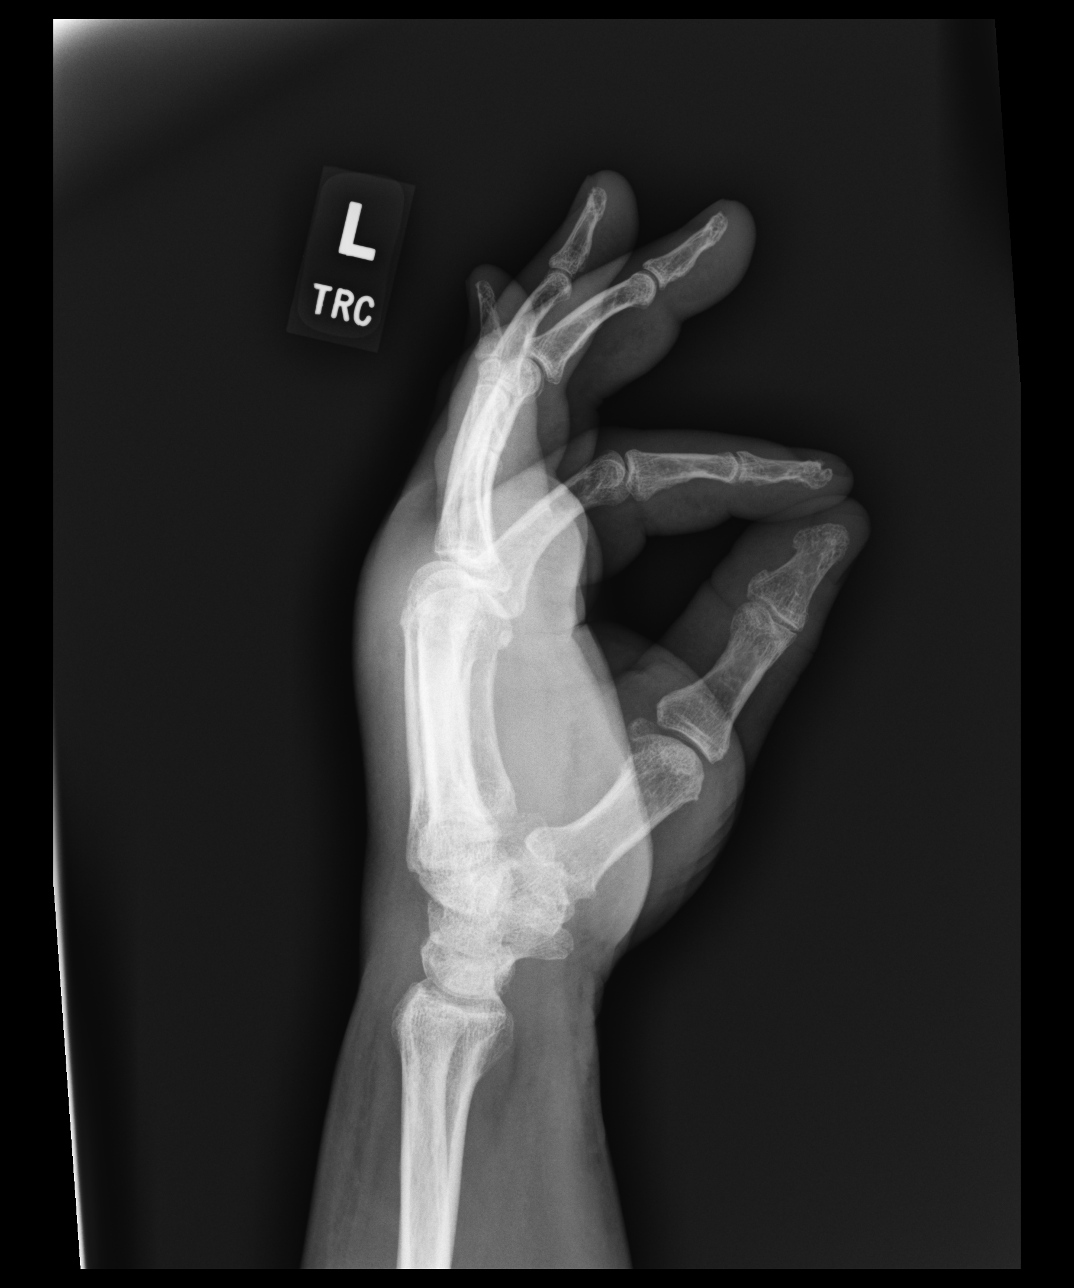

[3 of 3 positions shown; findings below may reference images not displayed]

FINDINGS: There is no evidence of fracture or dislocation. There is no
evidence of arthropathy or other focal bone abnormality. Soft
tissues are unremarkable.
IMPRESSION: Negative.

## 2021-08-22 ENCOUNTER — Other Ambulatory Visit: Payer: Self-pay | Admitting: Family Medicine

## 2021-08-22 DIAGNOSIS — I10 Essential (primary) hypertension: Secondary | ICD-10-CM

## 2021-08-24 ENCOUNTER — Other Ambulatory Visit: Payer: Self-pay | Admitting: Family Medicine

## 2021-08-24 DIAGNOSIS — I10 Essential (primary) hypertension: Secondary | ICD-10-CM

## 2021-08-30 ENCOUNTER — Other Ambulatory Visit: Payer: Self-pay | Admitting: Family Medicine

## 2021-09-30 ENCOUNTER — Other Ambulatory Visit: Payer: Self-pay | Admitting: Family Medicine

## 2021-10-28 DIAGNOSIS — Z8546 Personal history of malignant neoplasm of prostate: Secondary | ICD-10-CM | POA: Diagnosis not present

## 2021-11-02 ENCOUNTER — Other Ambulatory Visit: Payer: Self-pay | Admitting: Family Medicine

## 2021-11-04 ENCOUNTER — Telehealth: Payer: Self-pay

## 2021-11-04 MED ORDER — COLCHICINE 0.6 MG PO TABS: 0.6000 mg | ORAL_TABLET | Freq: Every day | ORAL | 0 refills | Status: DC

## 2021-11-04 MED ORDER — LISINOPRIL 40 MG PO TABS: 40.0000 mg | ORAL_TABLET | Freq: Every day | ORAL | 0 refills | Status: DC

## 2021-11-04 NOTE — Telephone Encounter (Signed)
Refills sent to pharmacy as requested.

## 2021-11-04 NOTE — Telephone Encounter (Signed)
Pt came into office to schedule appt and to request a refill of lisinopril (ZESTRIL) 40 MG tablet and colchicine 0.6 MG tablet. Pt stated that he is out of these meds and would like to get them refilled please. Pt's appt is scheduled for this Tues 11/08/21. Please advise.  Cb#: [phone]

## 2021-11-07 DIAGNOSIS — R351 Nocturia: Secondary | ICD-10-CM | POA: Diagnosis not present

## 2021-11-07 DIAGNOSIS — Z8546 Personal history of malignant neoplasm of prostate: Secondary | ICD-10-CM | POA: Diagnosis not present

## 2021-11-07 DIAGNOSIS — R800 Isolated proteinuria: Secondary | ICD-10-CM | POA: Diagnosis not present

## 2021-11-07 DIAGNOSIS — N5201 Erectile dysfunction due to arterial insufficiency: Secondary | ICD-10-CM | POA: Diagnosis not present

## 2021-11-08 ENCOUNTER — Ambulatory Visit (INDEPENDENT_AMBULATORY_CARE_PROVIDER_SITE_OTHER): Payer: Medicare Other | Admitting: Family Medicine

## 2021-11-08 ENCOUNTER — Other Ambulatory Visit: Payer: Self-pay

## 2021-11-08 ENCOUNTER — Encounter: Payer: Self-pay | Admitting: Family Medicine

## 2021-11-08 VITALS — BP 138/92 | HR 68 | Temp 97.5°F | Resp 18 | Ht 68.5 in | Wt 204.0 lb

## 2021-11-08 DIAGNOSIS — E118 Type 2 diabetes mellitus with unspecified complications: Secondary | ICD-10-CM

## 2021-11-08 DIAGNOSIS — R221 Localized swelling, mass and lump, neck: Secondary | ICD-10-CM

## 2021-11-08 DIAGNOSIS — R809 Proteinuria, unspecified: Secondary | ICD-10-CM

## 2021-11-08 DIAGNOSIS — N183 Chronic kidney disease, stage 3 unspecified: Secondary | ICD-10-CM | POA: Diagnosis not present

## 2021-11-08 DIAGNOSIS — I10 Essential (primary) hypertension: Secondary | ICD-10-CM | POA: Diagnosis not present

## 2021-11-08 MED ORDER — DAPAGLIFLOZIN PROPANEDIOL 10 MG PO TABS: 10.0000 mg | ORAL_TABLET | Freq: Every day | ORAL | 11 refills | Status: DC

## 2021-11-08 NOTE — Progress Notes (Signed)
Subjective:    Patient ID: Travis Singh, male    DOB: November 29, 1945, 76 y.o.   MRN: [id number]  HPI  Patient is a 76 year old African-American gentleman with a history of type 2 diabetes mellitus, chronic kidney disease, and proteinuria.  I last saw him for his diabetes in 2021.  At that time he was on maximum dose lisinopril.  He is not currently on an SGLT2 medication.  Since I last saw him, significant evidence of been published regarding prevention of progression of chronic kidney disease with proteinuria with [name].  I would like to discuss that with him today.  Patient also has a cyst like mass just behind and inferior to his left earlobe.  He says its been there for a few years.  However it is firm to the touch and not freely mobile.  I believe it is too superficial to be a lymph node.  I believe it is likely a sebaceous cyst.  However this is 1 that I would recommend having an ENT doctor biopsy and remove.  Patient agrees.  He denies any chest pain.  He denies any shortness of breath.  He denies any dyspnea on exertion. Past Medical History:  Diagnosis Date   Bulging lumbar disc    multiple levels   Diabetes mellitus type 2 with complications [facility])    ED (erectile dysfunction)    Gout    History of adenomatous polyp of colon    Hyperlipidemia    Hypertension    Left carotid artery stenosis <50%   Left sciatic nerve pain    Lumbar stenosis    Nocturia    Prostate cancer [facility]) urologist-  dr wrenn/ oncologist-  dr Tammi Klippel   dx 01-30-2017 w/ Gleason 3+3, PSA 4.59, active survillance/  biopsy 02-06-2018 , Stage T1c, Gleason 3+4, PSA 7.08-- scheduled for radioactive seed implants   Thyroid mass    see [place] 8/20   Wears glasses    Past Surgical History:  Procedure Laterality Date   CYSTOSCOPY  06/20/2018   Procedure: CYSTOSCOPY FLEXIBLE;  Surgeon: Irine Seal, MD;  Location: [facility];  Service: Urology;;  NO SEEDS FOUND IN BLADDER   NO PAST SURGERIES     PROSTATE  BIOPSY  01-30-2017;  02-06-2018  dr Jeffie Pollock office   RADIOACTIVE SEED IMPLANT N/A 06/20/2018   Procedure: RADIOACTIVE SEED IMPLANT/BRACHYTHERAPY IMPLANT;  Surgeon: Irine Seal, MD;  Location: [facility];  Service: Urology;  Laterality: N/A;   86   SEEDS IMPLANTED   SPACE OAR INSTILLATION N/A 06/20/2018   Procedure: SPACE OAR INSTILLATION;  Surgeon: Irine Seal, MD;  Location: [facility];  Service: Urology;  Laterality: N/A;   Current Outpatient Medications on File Prior to Visit  Medication Sig Dispense Refill   allopurinol (ZYLOPRIM) 300 MG tablet Take 1 tablet by mouth once daily 90 tablet 0   amLODipine (NORVASC) 5 MG tablet Take 1 tablet by mouth once daily 90 tablet 0   atorvastatin (LIPITOR) 40 MG tablet Take 1 tablet by mouth once daily 90 tablet 0   colchicine 0.6 MG tablet Take 1 tablet (0.6 mg total) by mouth daily. 90 tablet 0   hydrochlorothiazide (HYDRODIURIL) 25 MG tablet Take 1 tablet by mouth once daily 90 tablet 0   lisinopril (ZESTRIL) 40 MG tablet Take 1 tablet (40 mg total) by mouth daily. 90 tablet 0   No current facility-administered medications on file prior to visit.   No Known Allergies Social History  Socioeconomic History   Marital status: Married    Spouse name: Not on file   Number of children: Not on file   Years of education: Not on file   Highest education level: Not on file  Occupational History   Occupation: Nature conservation officer  Tobacco Use   Smoking status: Some Days    Types: Cigars   Smokeless tobacco: Never   Tobacco comments:    06-13-2018  per pt every once in a while , cigar  Vaping Use   Vaping Use: Never used  Substance and Sexual Activity   Alcohol use: Not Currently    Alcohol/week: 7.0 standard drinks    Types: 7 Cans of beer per week   Drug use: No   Sexual activity: Not Currently  Other Topics Concern   Not on file  Social History Narrative   Married with one daughter and three sons. Works in  Architect.    Social Determinants of Health   Financial Resource Strain: Low Risk    Difficulty of Paying Living Expenses: Not hard at all  Food Insecurity: No Food Insecurity   Worried About Charity fundraiser in the Last Year: Never true   [place] in the Last Year: Never true  Transportation Needs: No Transportation Needs   Lack of Transportation (Medical): No   Lack of Transportation (Non-Medical): No  Physical Activity: Not on file  Stress: Not on file  Social Connections: Unknown   Frequency of Communication with Friends and Family: More than three times a week   Frequency of Social Gatherings with Friends and Family: Three times a week   Attends Religious Services: More than 4 times per year   Active Member of Clubs or Organizations: No   Attends Archivist Meetings: Never   Marital Status: Not on file  Intimate Partner Violence: Not At Risk   Fear of Current or Ex-Partner: No   Emotionally Abused: No   Physically Abused: No   Sexually Abused: No   Family History  Problem Relation Age of Onset   Pancreatic cancer Mother    Leukemia Father    Lung cancer Brother    Lung cancer Brother    Lung cancer Brother   '    Review of Systems  All other systems reviewed and are negative.     Objective:   Physical Exam Constitutional:      General: He is not in acute distress.    Appearance: Normal appearance. He is obese. He is not ill-appearing, toxic-appearing or diaphoretic.  HENT:     Head: Normocephalic and atraumatic.      Right Ear: Tympanic membrane, ear canal and external ear normal.     Left Ear: Tympanic membrane, ear canal and external ear normal.     Nose: Nose normal. No congestion or rhinorrhea.     Mouth/Throat:     Mouth: Mucous membranes are moist.     Pharynx: Oropharynx is clear. No oropharyngeal exudate.  Eyes:     General: No scleral icterus.       Right eye: No discharge.        Left eye: No discharge.     Extraocular  Movements: Extraocular movements intact.     Conjunctiva/sclera: Conjunctivae normal.     Pupils: Pupils are equal, round, and reactive to light.  Neck:     Thyroid: Thyromegaly present.     Vascular: Carotid bruit present.  Cardiovascular:     Rate and Rhythm:  Normal rate and regular rhythm.     Pulses: Normal pulses.     Heart sounds: Normal heart sounds. No murmur heard.   No friction rub. No gallop.  Pulmonary:     Effort: Pulmonary effort is normal. No respiratory distress.     Breath sounds: Normal breath sounds. No stridor. No wheezing, rhonchi or rales.  Chest:     Chest wall: No tenderness.  Abdominal:     General: Bowel sounds are normal. There is no distension.     Palpations: Abdomen is soft. There is no mass.     Tenderness: There is no abdominal tenderness. There is no right CVA tenderness, left CVA tenderness, guarding or rebound.     Hernia: No hernia is present.  Musculoskeletal:        General: Normal range of motion.     Cervical back: Neck supple. No muscular tenderness.     Right lower leg: No edema.     Left lower leg: No edema.  Skin:    General: Skin is warm.     Coloration: Skin is not jaundiced or pale.     Findings: No bruising, erythema, lesion or rash.  Neurological:     General: No focal deficit present.     Mental Status: He is alert and oriented to person, place, and time.     Cranial Nerves: No cranial nerve deficit.     Sensory: No sensory deficit.     Motor: No weakness.     Coordination: Coordination normal.     Gait: Gait normal.     Deep Tendon Reflexes: Reflexes normal.  Psychiatric:        Mood and Affect: Mood normal.        Behavior: Behavior normal.        Thought Content: Thought content normal.        Judgment: Judgment normal.          Assessment & Plan:  Diabetes mellitus type 2 with complications (HCC) - Plan: Hemoglobin A1c, CBC with Differential/Platelet, Lipid panel, COMPLETE METABOLIC PANEL WITH GFR, Protein /  Creatinine Ratio, Urine  Stage 3 chronic kidney disease, unspecified whether stage 3a or 3b CKD ([place]) - Plan: Hemoglobin A1c, CBC with Differential/Platelet, Lipid panel, COMPLETE METABOLIC PANEL WITH GFR, Protein / Creatinine Ratio, Urine  Proteinuria, unspecified type - Plan: Hemoglobin A1c, CBC with Differential/Platelet, Lipid panel, COMPLETE METABOLIC PANEL WITH GFR, Protein / Creatinine Ratio, Urine  Benign essential HTN  Mass of left side of neck - Plan: Ambulatory referral to ENT Spent 30 minutes today with the patient discussing his medical care.  I believe he would benefit from taking Farxiga 10 mg a day.  This has been shown in studies to reduce his all cause mortality.  It also helps reduce the progression of chronic kidney disease as well as manage his diabetes.  Patient is willing to try this.  Begin 10 mg a day.  Check an A1c, lipid panel, CBC, and a CMP along with a protein to creatinine ratio.  His goal LDL cholesterol is less than 100.  I am concerned that the mass on the left side of the neck is most likely a sebaceous cyst.  However it is firm and not mobile.  I believe it is too superficial to be a lymph node.  I will consult ENT for excision.

## 2021-11-09 LAB — LIPID PANEL
Cholesterol: 171 mg/dL (ref <200)
HDL: 48 mg/dL (ref >=40)
LDL Cholesterol (Calc): 100 mg/dL (calc) — ABNORMAL HIGH
Non-HDL Cholesterol (Calc): 123 mg/dL (calc) (ref <130)
Total CHOL/HDL Ratio: 3.6 (calc) (ref <5.0)
Triglycerides: 136 mg/dL (ref <150)

## 2021-11-09 LAB — CBC WITH DIFFERENTIAL/PLATELET
Absolute Monocytes: 598 cells/uL (ref 200–950)
Basophils Absolute: 9 cells/uL (ref 0–200)
Basophils Relative: 0.2 %
Eosinophils Absolute: 78 cells/uL (ref 15–500)
Eosinophils Relative: 1.7 %
HCT: 41.7 % (ref 38.5–50.0)
Hemoglobin: 13.8 g/dL (ref 13.2–17.1)
Lymphs Abs: 1306 cells/uL (ref 850–3900)
MCH: 28.6 pg (ref 27.0–33.0)
MCHC: 33.1 g/dL (ref 32.0–36.0)
MCV: 86.5 fL (ref 80.0–100.0)
MPV: 11.3 fL (ref 7.5–12.5)
Monocytes Relative: 13 %
Neutro Abs: 2608 cells/uL (ref 1500–7800)
Neutrophils Relative %: 56.7 %
Platelets: 183 10*3/uL (ref 140–400)
RBC: 4.82 10*6/uL (ref 4.20–5.80)
RDW: 13.8 % (ref 11.0–15.0)
Total Lymphocyte: 28.4 %
WBC: 4.6 10*3/uL (ref 3.8–10.8)

## 2021-11-09 LAB — COMPLETE METABOLIC PANEL WITH GFR
AG Ratio: 1.7 (calc) (ref 1.0–2.5)
ALT: 23 U/L (ref 9–46)
AST: 23 U/L (ref 10–35)
Albumin: 4 g/dL (ref 3.6–5.1)
Alkaline phosphatase (APISO): 83 U/L (ref 35–144)
BUN/Creatinine Ratio: 12 (calc) (ref 6–22)
BUN: 18 mg/dL (ref 7–25)
CO2: 26 mmol/L (ref 20–32)
Calcium: 8.8 mg/dL (ref 8.6–10.3)
Chloride: 110 mmol/L (ref 98–110)
Creat: 1.46 mg/dL — ABNORMAL HIGH (ref 0.70–1.28)
Globulin: 2.3 g/dL (calc) (ref 1.9–3.7)
Glucose, Bld: 130 mg/dL — ABNORMAL HIGH (ref 65–99)
Potassium: 4.1 mmol/L (ref 3.5–5.3)
Sodium: 143 mmol/L (ref 135–146)
Total Bilirubin: 0.7 mg/dL (ref 0.2–1.2)
Total Protein: 6.3 g/dL (ref 6.1–8.1)
eGFR: 49 mL/min/{1.73_m2} — ABNORMAL LOW (ref >=60)

## 2021-11-09 LAB — PROTEIN / CREATININE RATIO, URINE
Creatinine, Urine: 157 mg/dL (ref 20–320)
Protein/Creat Ratio: 5045 mg/g creat — ABNORMAL HIGH (ref 25–148)
Protein/Creatinine Ratio: 5.045 mg/mg creat — ABNORMAL HIGH (ref 0.025–0.148)
Total Protein, Urine: 792 mg/dL — ABNORMAL HIGH (ref 5–25)

## 2021-11-09 LAB — HEMOGLOBIN A1C
Hgb A1c MFr Bld: 6.8 % of total Hgb — ABNORMAL HIGH (ref <5.7)
Mean Plasma Glucose: 148 mg/dL
eAG (mmol/L): 8.2 mmol/L

## 2021-11-11 ENCOUNTER — Other Ambulatory Visit: Payer: Self-pay

## 2021-11-22 ENCOUNTER — Other Ambulatory Visit: Payer: Self-pay | Admitting: Family Medicine

## 2021-12-06 ENCOUNTER — Other Ambulatory Visit: Payer: Self-pay

## 2021-12-06 DIAGNOSIS — E118 Type 2 diabetes mellitus with unspecified complications: Secondary | ICD-10-CM

## 2021-12-06 DIAGNOSIS — N183 Chronic kidney disease, stage 3 unspecified: Secondary | ICD-10-CM

## 2021-12-13 ENCOUNTER — Telehealth: Payer: Self-pay | Admitting: Family Medicine

## 2021-12-13 NOTE — Chronic Care Management (AMB) (Signed)
?  Chronic Care Management  ? ?Note ? ?12/13/2021 ?Name: Travis Singh MRN: [id number] DOB: [birth year] ? ?Travis Singh is a 76 y.o. year old male who is a primary care patient of Pickard, Cammie Mcgee, MD. I reached out to Verneda Skill by phone today in response to a referral sent by [name] PCP, [name], MD.  ? ?[name] given information about Chronic Care Management services today including:  ?CCM service includes personalized support from designated clinical staff supervised by his physician, including individualized plan of care and coordination with other care providers ?24/7 contact phone numbers for assistance for urgent and routine care needs. ?Service will only be billed when office clinical staff spend 20 minutes or more in a month to coordinate care. ?Only one practitioner may furnish and bill the service in a calendar month. ?The patient may stop CCM services at any time (effective at the end of the month) by phone call to the office staff. ? ? ?[name]/ DAUGHTER verbally agreed to assistance and services provided by embedded care coordination/care management team today. ? ?Follow up plan: ?AWARE PT WILL BE RESPONSIBLE FOR 20% OF THE VISIT ? ?Tatjana Dellinger ?Upstream Scheduler  ?

## 2021-12-26 DIAGNOSIS — E049 Nontoxic goiter, unspecified: Secondary | ICD-10-CM | POA: Diagnosis not present

## 2021-12-26 DIAGNOSIS — R221 Localized swelling, mass and lump, neck: Secondary | ICD-10-CM | POA: Diagnosis not present

## 2021-12-27 ENCOUNTER — Telehealth: Payer: Self-pay | Admitting: Pharmacist

## 2021-12-27 NOTE — Progress Notes (Signed)
? ? ?Chronic Care Management ?Pharmacy Assistant  ? ?Name: Travis Singh  MRN: [id number] DOB: Jan 03, 1946 ? ? ?Reason for Encounter: Chart Review For Initial Visit With Clinical Pharmacist ?  ?Conditions to be addressed/monitored: ?HTN, DM II, Prostate cancer ? ?Primary concerns for visit include: ?HTN, DM II  ? ?Recent office visits:  ?11/08/2021 OV (PCP) [name], MD;  I believe he would benefit from taking Farxiga 10 mg a day.  This has been shown in studies to reduce his all cause mortality.  It also helps reduce the progression of chronic kidney disease as well as manage his diabetes.  Patient is willing to try this.  Begin 10 mg a day.   ? ?Recent consult visits:  ?None ? ?Hospital visits:  ?None in previous 6 months ? ?Medications: ?Outpatient Encounter Medications as of 12/27/2021  ?Medication Sig  ? allopurinol (ZYLOPRIM) 300 MG tablet Take 1 tablet by mouth once daily  ? amLODipine (NORVASC) 5 MG tablet Take 1 tablet by mouth once daily  ? atorvastatin (LIPITOR) 40 MG tablet Take 1 tablet by mouth once daily  ? colchicine 0.6 MG tablet Take 1 tablet (0.6 mg total) by mouth daily.  ? dapagliflozin propanediol (FARXIGA) 10 MG TABS tablet Take 1 tablet (10 mg total) by mouth daily before breakfast.  ? hydrochlorothiazide (HYDRODIURIL) 25 MG tablet Take 1 tablet by mouth once daily (Patient not taking: Reported on 11/08/2021)  ? lisinopril (ZESTRIL) 40 MG tablet Take 1 tablet (40 mg total) by mouth daily.  ? ?No facility-administered encounter medications on file as of 12/27/2021.  ? ?Current Medications: ?Allopurinol 300 mg last filled 11/23/2021 90 DS ?Atorvastatin 40 mg last filled 11/23/2021 90 DS ?Farxiga 10 mg - not yet started due to cost ?Lisinopril 40 mg last filled 11/04/2021 90 DS ?Colchicine 0.6 mg last filled 11/04/2021 90 DS ?Amlodipine 5 mg last filled 05/24/2021 90 DS - takes daily ?Hydrochlorothiazide 25 mg last filled 06/04/2020 90 DS - takes daily ? ?Patient Questions: ?Any changes in  your medications or health? ?Patient's dauhter states he hasn't had any changes in his medications or health. ? ?Any side effects from any medications?  ?None other than the HCTZ causing urination. ? ?Do you have any symptoms or problems not managed by your medications? ?Patient's daughter states the patient has a lot of back pain. ? ?Any concerns about your health right now? ?Patient's daughter states they do not have any concerns with his health at this time. ? ?Has your provider asked that you check blood pressure, blood sugar, or follow special diet at home? ?Patient checks his blood pressure. He does not check blood sugar. He does not follow any special diet. ? ?Do you get any type of exercise on a regular basis? ?Patient likes to walk a lot. He still works some. ? ?Can you think of a goal you would like to reach for your health? ?Patient would like to have better management of the pain in his back. ? ?Do you have any problems getting your medications? ?"Wilder Glade is too expensive, all other meds are good." ? ?Is there anything that you would like to discuss during the appointment?  ?Are there any other options for pain other than pain medication (narcotics). Are there any alternatives to [name]. -will send out application for PAP ? ?Please bring medications and supplements to appointment ? ?Care Gaps: ?Medicare Annual Wellness: Completed 02/24/2021 ?Ophthalmology Exam: Overdue - never done ?Foot Exam: Overdue since 10/13/2016 ?Hemoglobin A1C: 6.8% on 11/08/2021 ?  Colonoscopy: Aged out, last completed 09/26/2007 ? ?Future Appointments  ?Date Time Provider [place]  ?12/29/2021  9:00 AM [facility]-CCM PHARMACIST [facility]  ?02/09/2022  9:00 AM Pickard, Cammie Mcgee, MD [facility]  ?03/02/2022  9:00 AM [facility]-NURSE HEALTH ADVISOR [facility]  ? ?Star Rating Drugs: ?Atorvastatin 40 mg last filled 11/23/2021 90 DS ?Lisinopril 40 mg last filled 11/04/2021 90 DS ? ?April D Calhoun, [place] ?Clinical Pharmacist  Assistant ?[phone] ?

## 2021-12-27 NOTE — Progress Notes (Signed)
? ?Chronic Care Management ?Pharmacy Note ? ?12/29/2021 ?Name:  Travis Singh MRN:  [id number] DOB:  1945-12-16 ? ?Summary: ?Initial visit with PharmD.  Mainly concerned about cost of farxiga.  Application completed in office today and he was approved.  Should receive in 10-14 days.  He had not taken in 2-3 weeks.  Counseled on dietary changes for controlling glucose. ? ?Recommendations/Changes made from today's visit: ?Start Wilder Glade once received ?DUE FOR - diabetic foot exam, eye exam ? ?Plan: ?FU 1 year ? ? ?Subjective: ?Travis Singh is an 76 y.o. year old male who is a primary patient of Pickard, Cammie Mcgee, MD.  The CCM team was consulted for assistance with disease management and care coordination needs.   ? ?Engaged with patient face to face for initial visit in response to provider referral for pharmacy case management and/or care coordination services.  ? ?Consent to Services:  ?The patient was given the following information about Chronic Care Management services today, agreed to services, and gave verbal consent: 1. CCM service includes personalized support from designated clinical staff supervised by the primary care provider, including individualized plan of care and coordination with other care providers 2. 24/7 contact phone numbers for assistance for urgent and routine care needs. 3. Service will only be billed when office clinical staff spend 20 minutes or more in a month to coordinate care. 4. Only one practitioner may furnish and bill the service in a calendar month. 5.The patient may stop CCM services at any time (effective at the end of the month) by phone call to the office staff. 6. The patient will be responsible for cost sharing (co-pay) of up to 20% of the service fee (after annual deductible is met). Patient agreed to services and consent obtained. ? ?Patient Care Team: ?[name], MD as PCP - General (Family Medicine) ?Edythe Clarity, [facility] as Pharmacist (Pharmacist) ? ?Recent  office visits:  ?11/08/2021 OV (PCP) [name], MD;  I believe he would benefit from taking Farxiga 10 mg a day.  This has been shown in studies to reduce his all cause mortality.  It also helps reduce the progression of chronic kidney disease as well as manage his diabetes.  Patient is willing to try this.  Begin 10 mg a day.   ?  ?Recent consult visits:  ?None ?  ?Hospital visits:  ?None in previous 6 months ? ?Objective: ? ?Lab Results  ?Component Value Date  ? CREATININE 1.46 (H) 11/08/2021  ? BUN 18 11/08/2021  ? EGFR 49 (L) 11/08/2021  ? GFRNONAA 40 (L) 08/13/2020  ? GFRAA 46 (L) 08/13/2020  ? NA 143 11/08/2021  ? K 4.1 11/08/2021  ? CALCIUM 8.8 11/08/2021  ? CO2 26 11/08/2021  ? GLUCOSE 130 (H) 11/08/2021  ? ? ?Lab Results  ?Component Value Date/Time  ? HGBA1C 6.8 (H) 11/08/2021 09:26 AM  ? HGBA1C 6.9 (H) 08/13/2020 12:52 PM  ? MICROALBUR 118.0 11/14/2016 08:57 AM  ? MICROALBUR 74.9 04/24/2016 09:01 AM  ?  ?Last diabetic Eye exam: No results found for: HMDIABEYEEXA  ?Last diabetic Foot exam: No results found for: HMDIABFOOTEX  ? ?Lab Results  ?Component Value Date  ? CHOL 171 11/08/2021  ? HDL 48 11/08/2021  ? LDLCALC 100 (H) 11/08/2021  ? TRIG 136 11/08/2021  ? CHOLHDL 3.6 11/08/2021  ? ? ? ?  Latest Ref Rng & Units 11/08/2021  ?  9:26 AM 08/13/2020  ? 12:52 PM 05/24/2020  ?  8:38 AM  ?  Hepatic Function  ?Total Protein 6.1 - 8.1 g/dL 6.3   6.9   6.5    ?AST 10 - 35 U/L $Remo'23   21   18    'caMJe$ ?ALT 9 - 46 U/L $Remo'23   24   21    'Kgwwc$ ?Total Bilirubin 0.2 - 1.2 mg/dL 0.7   0.6   0.5    ? ? ?Lab Results  ?Component Value Date/Time  ? TSH 0.91 08/13/2020 12:52 PM  ? FREET4 1.0 08/13/2020 12:52 PM  ? ? ? ?  Latest Ref Rng & Units 11/08/2021  ?  9:26 AM 08/13/2020  ? 12:52 PM 05/24/2020  ?  8:38 AM  ?CBC  ?WBC 3.8 - 10.8 Thousand/uL 4.6   4.8   4.2    ?Hemoglobin 13.2 - 17.1 g/dL 13.8   13.8   13.9    ?Hematocrit 38.5 - 50.0 % 41.7   41.8   43.1    ?Platelets 140 - 400 Thousand/uL 183   181   181    ? ? ?No results found for:  VD25OH ? ?Clinical ASCVD: No  ?The 10-year ASCVD risk score (Arnett DK, et al., 2019) is: 75.6% ?  Values used to calculate the score: ?    Age: 13 years ?    Sex: Male ?    Is Non-Hispanic African American: Yes ?    Diabetic: Yes ?    Tobacco smoker: Yes ?    Systolic Blood Pressure: 149 mmHg ?    Is BP treated: Yes ?    HDL Cholesterol: 48 mg/dL ?    Total Cholesterol: 171 mg/dL   ? ? ?  05/15/2019  ? 10:31 AM 03/13/2018  ?  7:38 AM 11/14/2016  ?  8:51 AM  ?Depression screen PHQ 2/9  ?Decreased Interest 0 0 0  ?Down, Depressed, Hopeless 0 0 0  ?PHQ - 2 Score 0 0 0  ? ? ?Social History  ? ?Tobacco Use  ?Smoking Status Some Days  ? Types: Cigars  ?Smokeless Tobacco Never  ?Tobacco Comments  ? 06-13-2018  per pt every once in a while , cigar  ? ?BP Readings from Last 3 Encounters:  ?11/08/21 (!) 138/92  ?02/24/21 124/84  ?11/12/20 120/80  ? ?Pulse Readings from Last 3 Encounters:  ?11/08/21 68  ?11/12/20 62  ?11/04/20 76  ? ?Wt Readings from Last 3 Encounters:  ?11/08/21 204 lb (92.5 kg)  ?02/24/21 208 lb 6.4 oz (94.5 kg)  ?11/04/20 208 lb (94.3 kg)  ? ?BMI Readings from Last 3 Encounters:  ?11/08/21 30.57 kg/m?  ?02/24/21 31.23 kg/m?  ?11/04/20 31.63 kg/m?  ? ? ?Assessment/Interventions: Review of patient past medical history, allergies, medications, health status, including review of consultants reports, laboratory and other test data, was performed as part of comprehensive evaluation and provision of chronic care management services.  ? ?SDOH:  (Social Determinants of Health) assessments and interventions performed: Yes ? ?Financial Resource Strain: Low Risk   ? Difficulty of Paying Living Expenses: Not hard at all  ? ?Food Insecurity: No Food Insecurity  ? Worried About Charity fundraiser in the Last Year: Never true  ? Ran Out of Food in the Last Year: Never true  ? ? ?SDOH Screenings  ? ?Alcohol Screen: Not on file  ?Depression (PHQ2-9): Not on file  ?Financial Resource Strain: Low Risk   ? Difficulty of Paying  Living Expenses: Not hard at all  ?Food Insecurity: No Food Insecurity  ? Worried About Estate manager/land agent  of Food in the Last Year: Never true  ? Ran Out of Food in the Last Year: Never true  ?Housing: Low Risk   ? Last Housing Risk Score: 0  ?Physical Activity: Not on file  ?Social Connections: Unknown  ? Frequency of Communication with Friends and Family: More than three times a week  ? Frequency of Social Gatherings with Friends and Family: Three times a week  ? Attends Religious Services: More than 4 times per year  ? Active Member of Clubs or Organizations: No  ? Attends Archivist Meetings: Never  ? Marital Status: Not on file  ?Stress: Not on file  ?Tobacco Use: High Risk  ? Smoking Tobacco Use: Some Days  ? Smokeless Tobacco Use: Never  ? Passive Exposure: Not on file  ?Transportation Needs: No Transportation Needs  ? Lack of Transportation (Medical): No  ? Lack of Transportation (Non-Medical): No  ? ? ?CCM Care Plan ? ?No Known Allergies ? ?Medications Reviewed Today   ? ? Reviewed by Edythe Clarity, RPH (Pharmacist) on 12/29/21 at 1004  Med List Status: <None>  ? ?Medication Order Taking? Sig Documenting Provider Last Dose Status Informant  ?allopurinol (ZYLOPRIM) 300 MG tablet [id number] Yes Take 1 tablet by mouth once daily [name], MD Taking Active   ?amLODipine (NORVASC) 5 MG tablet [id number] Yes Take 1 tablet by mouth once daily [name], MD Taking Active   ?atorvastatin (LIPITOR) 40 MG tablet [id number] Yes Take 1 tablet by mouth once daily [name], MD Taking Active   ?colchicine 0.6 MG tablet [id number] Yes Take 1 tablet (0.6 mg total) by mouth daily. [name], MD Taking Active   ?dapagliflozin propanediol (FARXIGA) 10 MG TABS tablet [id number] Yes Take 1 tablet (10 mg total) by mouth daily before breakfast. [name], MD Taking Active   ?hydrochlorothiazide (HYDRODIURIL) 25 MG tablet [id number] Yes Take 1 tablet by mouth once daily [name],  MD Taking Active   ?lisinopril (ZESTRIL) 40 MG tablet [id number] Yes Take 1 tablet (40 mg total) by mouth daily. [name], MD Taking Active   ? ?  ?  ? ?  ? ? ?Patient Active Problem List  ? Diagn

## 2021-12-28 ENCOUNTER — Telehealth: Payer: Self-pay | Admitting: Pharmacist

## 2021-12-28 NOTE — Progress Notes (Signed)
? ? ?  Chronic Care Management ?Pharmacy Assistant  ? ?Name: Travis Singh  MRN: [id number] DOB: 08-23-1946 ? ? ?Reason for Encounter: PAP for Farxiga ?  ?PAP form initiated for [name]. Will be mailed to patient to complete patient portion and return to prescribing MD office for final portion to be completed by MD and faxed in for patient for processing. Patient will update on the outcome of their application once received.  ? ? ?Liza Showfety, CCMA ?Clinical Pharmacist Assistant  ?[phone] ? ?

## 2021-12-29 ENCOUNTER — Ambulatory Visit (INDEPENDENT_AMBULATORY_CARE_PROVIDER_SITE_OTHER): Payer: Medicare Other | Admitting: Pharmacist

## 2021-12-29 DIAGNOSIS — E118 Type 2 diabetes mellitus with unspecified complications: Secondary | ICD-10-CM

## 2021-12-29 DIAGNOSIS — I10 Essential (primary) hypertension: Secondary | ICD-10-CM

## 2021-12-29 DIAGNOSIS — N183 Chronic kidney disease, stage 3 unspecified: Secondary | ICD-10-CM

## 2021-12-29 NOTE — Patient Instructions (Addendum)
Visit Information ? ? Goals Addressed   ? ?  ?  ?  ?  ? This Visit's Progress  ?  Monitor and Manage My Blood Sugar-Diabetes Type 2     ?  Timeframe:  Long-Range Goal ?Priority:  High ?Start Date:  12/29/21                            ?Expected End Date: 06/30/22                     ? ?Follow Up Date 03/30/22  ?  ?- check blood sugar at prescribed times ?- enter blood sugar readings and medication or insulin into daily log ?- take the blood sugar log to all doctor visits  ?  ?Why is this important?   ?Checking your blood sugar at home helps to keep it from getting very high or very low.  ?Writing the results in a diary or log helps the doctor know how to care for you.  ?Your blood sugar log should have the time, date and the results.  ?Also, write down the amount of insulin or other medicine that you take.  ?Other information, like what you ate, exercise done and how you were feeling, will also be helpful.   ?  ?Notes:  ?  ? ?  ? ?Patient Care Plan: General Pharmacy (Adult)  ?  ? ?Problem Identified: HTN, HLD, DM, CKD   ?Priority: High  ?Onset Date: 12/29/2021  ?  ? ?Long-Range Goal: Patient-Specific Goal   ?Start Date: 12/29/2021  ?Expected End Date: 06/30/2022  ?This Visit's Progress: On track  ?Priority: High  ?Note:   ?Current Barriers:  ?Suboptimal therapeutic regimen for DM ? ?Pharmacist Clinical Goal(s):  ?Patient will verbalize ability to afford treatment regimen ?achieve improvement in glucose as evidenced by A1c through collaboration with PharmD and provider.  ? ?Interventions: ?1:1 collaboration with [name], MD regarding development and update of comprehensive plan of care as evidenced by provider attestation and co-signature ?Inter-disciplinary care team collaboration (see longitudinal plan of care) ?Comprehensive medication review performed; medication list updated in electronic medical record ? ?Hypertension (BP goal <130/80) ?-Controlled ?-Current treatment: ?Amlodipine '5mg'$  daily Appropriate,  Effective, Safe, Accessible ?Lisinopril '40mg'$  daily Appropriate, Effective, Safe, Accessible ?-Medications previously tried: HCTZ  ?-Current home readings: daughter checks at home, he is unaware of readings ?-Current dietary habits: see DM ?-Current exercise habits: still very active working at home.  Does yard work, rides tractors, Social research officer, government. ?-Denies hypotensive/hypertensive symptoms ?-Educated on BP goals and benefits of medications for prevention of heart attack, stroke and kidney damage; ?Exercise goal of 150 minutes per week; ?Symptoms of hypotension and importance of maintaining adequate hydration; ?-Counseled to monitor BP at home a few times per week, document, and provide log at future appointments ?-Recommended to continue current medication ?Monitor BP at home - last office BP elevated.  No changes at this time will continue to follow. ? ?Hyperlipidemia: (LDL goal < 100) ?-Not ideally controlled ?-Current treatment: ?Atorvastatin '40mg'$  daily Appropriate, Query effective ?-Medications previously tried: none noted  ?-Current dietary patterns: see DM ?-Current exercise habits: see HTn ?-Educated on Cholesterol goals;  ?Benefits of statin for ASCVD risk reduction; ?Importance of limiting foods high in cholesterol; ?-Recommended to continue current medication ?Work on diet limiting things like sausage, bacon. ?Reports adherence on medication - fill dates support adherence. ?Recommend continue current dose - continue routine screenings.  ASCVD very high >  70%. ? ? ?Diabetes (A1c goal <7%) ?-Controlled ?-Current medications: ?None ?-Medications previously tried: [name] ($)  ?-Current home glucose readings ?fasting glucose: unsure of readings ?post prandial glucose: unsure of readings ?-Denies hypoglycemic/hyperglycemic symptoms ?-Current meal patterns:  ?breakfast: bacon sausage  ?lunch: chicken, vegetables  ?dinner: some vegetables, trying to limit red meats ?snacks:  ?drinks: sodas, water ?-Current exercise: working  around the house, riding tractors, etc. ?-Educated on A1c and blood sugar goals; ?Complications of diabetes including kidney damage, retinal damage, and cardiovascular disease; ?Prevention and management of hypoglycemic episodes; ?Benefits of routine self-monitoring of blood sugar; ?-Counseled to check feet daily and get yearly eye exams ?-Recommended to continue current medication ?Patient was approved today for Farxiga through patient assistance.  He has not taken at all in 2-3 weeks due to cost.  Will benefit progression of CKD. ? ?Patient Goals/Self-Care Activities ?Patient will:  ?- take medications as prescribed as evidenced by patient report and record review ?collaborate with provider on medication access solutions ?engage in dietary modifications by limiting carbs and sugars in sodas. ? ?Follow Up Plan: The care management team will reach out to the patient again over the next 365 days.  ?  ? ? ?Travis Singh was given information about Chronic Care Management services today including:  ?CCM service includes personalized support from designated clinical staff supervised by his physician, including individualized plan of care and coordination with other care providers ?24/7 contact phone numbers for assistance for urgent and routine care needs. ?Standard insurance, coinsurance, copays and deductibles apply for chronic care management only during months in which we provide at least 20 minutes of these services. Most insurances cover these services at 100%, however patients may be responsible for any copay, coinsurance and/or deductible if applicable. This service may help you avoid the need for more expensive face-to-face services. ?Only one practitioner may furnish and bill the service in a calendar month. ?The patient may stop CCM services at any time (effective at the end of the month) by phone call to the office staff. ? ?Patient agreed to services and verbal consent obtained.  ? ?The patient verbalized  understanding of instructions, educational materials, and care plan provided today and agreed to receive a mailed copy of patient instructions, educational materials, and care plan.  ?Telephone follow up appointment with pharmacy team member scheduled for: 1 year ? ?Edythe Clarity, [facility]  ?

## 2022-01-02 ENCOUNTER — Other Ambulatory Visit: Payer: Self-pay

## 2022-01-02 MED ORDER — DAPAGLIFLOZIN PROPANEDIOL 10 MG PO TABS
10.0000 mg | ORAL_TABLET | Freq: Every day | ORAL | 11 refills | Status: DC
Start: 2022-01-02 — End: 2023-01-16

## 2022-01-22 DIAGNOSIS — E118 Type 2 diabetes mellitus with unspecified complications: Secondary | ICD-10-CM | POA: Diagnosis not present

## 2022-01-22 DIAGNOSIS — N183 Chronic kidney disease, stage 3 unspecified: Secondary | ICD-10-CM | POA: Diagnosis not present

## 2022-01-22 DIAGNOSIS — I10 Essential (primary) hypertension: Secondary | ICD-10-CM | POA: Diagnosis not present

## 2022-02-08 ENCOUNTER — Other Ambulatory Visit: Payer: Self-pay | Admitting: Family Medicine

## 2022-02-08 NOTE — Telephone Encounter (Signed)
Requested Prescriptions  ?Pending Prescriptions Disp Refills  ?? colchicine 0.6 MG tablet [Pharmacy Med Name: Colchicine 0.6 MG Oral Tablet] 90 tablet 2  ?  Sig: Take 1 tablet by mouth once daily  ?  ? Endocrinology:  Gout Agents - colchicine Failed - 02/08/2022 11:15 AM  ?  ?  Failed - Cr in normal range and within 360 days  ?  Creat  ?Date Value Ref Range Status  ?11/08/2021 1.46 (H) 0.70 - 1.28 mg/dL Final  ? ?Creatinine, Urine  ?Date Value Ref Range Status  ?11/08/2021 157 20 - 320 mg/dL Final  ?   ?  ?  Passed - ALT in normal range and within 360 days  ?  ALT  ?Date Value Ref Range Status  ?11/08/2021 23 9 - 46 U/L Final  ?   ?  ?  Passed - AST in normal range and within 360 days  ?  AST  ?Date Value Ref Range Status  ?11/08/2021 23 10 - 35 U/L Final  ?   ?  ?  Passed - Valid encounter within last 12 months  ?  Recent Outpatient Visits   ?      ? 3 months ago Diabetes mellitus type 2 with complications ([place])  ? [facility], Cammie Mcgee, MD  ? 1 year ago Left hand pain  ? [facility], Cammie Mcgee, MD  ? 1 year ago Left hand pain  ? [facility] [place], Cammie Mcgee, MD  ? 1 year ago Goiter  ? [facility] Pickard, Cammie Mcgee, MD  ? 1 year ago Benign essential HTN  ? [facility]ard, Cammie Mcgee, MD  ?  ?  ? ?  ?  ?  Passed - CBC within normal limits and completed in the last 12 months  ?  WBC  ?Date Value Ref Range Status  ?11/08/2021 4.6 3.8 - 10.8 Thousand/uL Final  ? ?RBC  ?Date Value Ref Range Status  ?11/08/2021 4.82 4.20 - 5.80 Million/uL Final  ? ?Hemoglobin  ?Date Value Ref Range Status  ?11/08/2021 13.8 13.2 - 17.1 g/dL Final  ? ?HCT  ?Date Value Ref Range Status  ?11/08/2021 41.7 38.5 - 50.0 % Final  ? ?MCHC  ?Date Value Ref Range Status  ?11/08/2021 33.1 32.0 - 36.0 g/dL Final  ? ?MCH  ?Date Value Ref Range Status  ?11/08/2021 28.6 27.0 - 33.0 pg Final  ? ?MCV  ?Date Value Ref Range Status  ?11/08/2021 86.5 80.0 -  100.0 fL Final  ? ?No results found for: PLTCOUNTKUC, LABPLAT, [place] ?RDW  ?Date Value Ref Range Status  ?11/08/2021 13.8 11.0 - 15.0 % Final  ? ?  ?  ?  ?? lisinopril (ZESTRIL) 40 MG tablet [Pharmacy Med Name: Lisinopril 40 MG Oral Tablet] 90 tablet 0  ?  Sig: Take 1 tablet by mouth once daily  ?  ? Cardiovascular:  ACE Inhibitors Failed - 02/08/2022 11:15 AM  ?  ?  Failed - Cr in normal range and within 180 days  ?  Creat  ?Date Value Ref Range Status  ?11/08/2021 1.46 (H) 0.70 - 1.28 mg/dL Final  ? ?Creatinine, Urine  ?Date Value Ref Range Status  ?11/08/2021 157 20 - 320 mg/dL Final  ?   ?  ?  Failed - Last BP in normal range  ?  BP Readings from Last 1 Encounters:  ?11/08/21 (!) 138/92  ?   ?  ?  Passed - K in normal range and within 180 days  ?  Potassium  ?Date Value Ref Range Status  ?11/08/2021 4.1 3.5 - 5.3 mmol/L Final  ?   ?  ?  Passed - Patient is not pregnant  ?  ?  Passed - Valid encounter within last 6 months  ?  Recent Outpatient Visits   ?      ? 3 months ago Diabetes mellitus type 2 with complications ([place])  ? [facility] [place], Cammie Mcgee, MD  ? 1 year ago Left hand pain  ? [facility] [place], Cammie Mcgee, MD  ? 1 year ago Left hand pain  ? [facility] Pickard, Cammie Mcgee, MD  ? 1 year ago Goiter  ? [facility] Pickard, Cammie Mcgee, MD  ? 1 year ago Benign essential HTN  ? [facility] Pickard, Cammie Mcgee, MD  ?  ?  ? ?  ?  ?  ?Provider addressed abnormal labs 11/09/2021. ?

## 2022-02-09 ENCOUNTER — Ambulatory Visit: Payer: Medicare Other | Admitting: Family Medicine

## 2022-03-01 ENCOUNTER — Other Ambulatory Visit: Payer: Self-pay | Admitting: Family Medicine

## 2022-03-01 DIAGNOSIS — I10 Essential (primary) hypertension: Secondary | ICD-10-CM

## 2022-03-02 ENCOUNTER — Ambulatory Visit: Payer: Medicare Other

## 2022-03-08 NOTE — Patient Instructions (Signed)
Travis Singh , Thank you for taking time to come for your Medicare Wellness Visit. I appreciate your ongoing commitment to your health goals. Please review the following plan we discussed and let me know if I can assist you in the future.   Screening recommendations/referrals: Colonoscopy: Done 09/26/2007. No longer required.  Recommended yearly ophthalmology/optometry visit for glaucoma screening and checkup Recommended yearly dental visit for hygiene and checkup  Vaccinations: Influenza vaccine: Due Fall 2023. Pneumococcal vaccine: Done 02/12/2015 and 01/20/2011. Tdap vaccine: Due every 10 years. Shingles vaccine: Discussed. Covid-19: Done 08/02/2020.  Advanced directives: Please bring a copy of your health care power of attorney and living will to the office to be added to your chart at your convenience.   Conditions/risks identified: Aim for 30 minutes of exercise or brisk walking, 6-8 glasses of water, and 5 servings of fruits and vegetables each day.  Next appointment: Follow up in one year for your annual wellness visit. 2024.  Preventive Care 28 Years and Older, Male  Preventive care refers to lifestyle choices and visits with your health care provider that can promote health and wellness. What does preventive care include? A yearly physical exam. This is also called an annual well check. Dental exams once or twice a year. Routine eye exams. Ask your health care provider how often you should have your eyes checked. Personal lifestyle choices, including: Daily care of your teeth and gums. Regular physical activity. Eating a healthy diet. Avoiding tobacco and drug use. Limiting alcohol use. Practicing safe sex. Taking low doses of aspirin every day. Taking vitamin and mineral supplements as recommended by your health care provider. What happens during an annual well check? The services and screenings done by your health care provider during your annual well check will depend on  your age, overall health, lifestyle risk factors, and family history of disease. Counseling  Your health care provider may ask you questions about your: Alcohol use. Tobacco use. Drug use. Emotional well-being. Home and relationship well-being. Sexual activity. Eating habits. History of falls. Memory and ability to understand (cognition). Work and work Statistician. Screening  You may have the following tests or measurements: Height, weight, and BMI. Blood pressure. Lipid and cholesterol levels. These may be checked every 5 years, or more frequently if you are over 44 years old. Skin check. Lung cancer screening. You may have this screening every year starting at age 48 if you have a 30-pack-year history of smoking and currently smoke or have quit within the past 15 years. Fecal occult blood test (FOBT) of the stool. You may have this test every year starting at age 13. Flexible sigmoidoscopy or colonoscopy. You may have a sigmoidoscopy every 5 years or a colonoscopy every 10 years starting at age 47. Prostate cancer screening. Recommendations will vary depending on your family history and other risks. Hepatitis C blood test. Hepatitis B blood test. Sexually transmitted disease (STD) testing. Diabetes screening. This is done by checking your blood sugar (glucose) after you have not eaten for a while (fasting). You may have this done every 1-3 years. Abdominal aortic aneurysm (AAA) screening. You may need this if you are a current or former smoker. Osteoporosis. You may be screened starting at age 49 if you are at high risk. Talk with your health care provider about your test results, treatment options, and if necessary, the need for more tests. Vaccines  Your health care provider may recommend certain vaccines, such as: Influenza vaccine. This is recommended every year. Tetanus,  diphtheria, and acellular pertussis (Tdap, Td) vaccine. You may need a Td booster every 10 years. Zoster  vaccine. You may need this after age 62. Pneumococcal 13-valent conjugate (PCV13) vaccine. One dose is recommended after age 79. Pneumococcal polysaccharide (PPSV23) vaccine. One dose is recommended after age 65. Talk to your health care provider about which screenings and vaccines you need and how often you need them. This information is not intended to replace advice given to you by your health care provider. Make sure you discuss any questions you have with your health care provider. Document Released: 10/08/2015 Document Revised: 05/31/2016 Document Reviewed: 07/13/2015 Elsevier Interactive Patient Education  2017 [place] Prevention in the Home Falls can cause injuries. They can happen to people of all ages. There are many things you can do to make your home safe and to help prevent falls. What can I do on the outside of my home? Regularly fix the edges of walkways and driveways and fix any cracks. Remove anything that might make you trip as you walk through a door, such as a raised step or threshold. Trim any bushes or trees on the path to your home. Use bright outdoor lighting. Clear any walking paths of anything that might make someone trip, such as rocks or tools. Regularly check to see if handrails are loose or broken. Make sure that both sides of any steps have handrails. Any raised decks and porches should have guardrails on the edges. Have any leaves, snow, or ice cleared regularly. Use sand or salt on walking paths during winter. Clean up any spills in your garage right away. This includes oil or grease spills. What can I do in the bathroom? Use night lights. Install grab bars by the toilet and in the tub and shower. Do not use towel bars as grab bars. Use non-skid mats or decals in the tub or shower. If you need to sit down in the shower, use a plastic, non-slip stool. Keep the floor dry. Clean up any water that spills on the floor as soon as it happens. Remove  soap buildup in the tub or shower regularly. Attach bath mats securely with double-sided non-slip rug tape. Do not have throw rugs and other things on the floor that can make you trip. What can I do in the bedroom? Use night lights. Make sure that you have a light by your bed that is easy to reach. Do not use any sheets or blankets that are too big for your bed. They should not hang down onto the floor. Have a firm chair that has side arms. You can use this for support while you get dressed. Do not have throw rugs and other things on the floor that can make you trip. What can I do in the kitchen? Clean up any spills right away. Avoid walking on wet floors. Keep items that you use a lot in easy-to-reach places. If you need to reach something above you, use a strong step stool that has a grab bar. Keep electrical cords out of the way. Do not use floor polish or wax that makes floors slippery. If you must use wax, use non-skid floor wax. Do not have throw rugs and other things on the floor that can make you trip. What can I do with my stairs? Do not leave any items on the stairs. Make sure that there are handrails on both sides of the stairs and use them. Fix handrails that are broken or loose. Make  sure that handrails are as long as the stairways. Check any carpeting to make sure that it is firmly attached to the stairs. Fix any carpet that is loose or worn. Avoid having throw rugs at the top or bottom of the stairs. If you do have throw rugs, attach them to the floor with carpet tape. Make sure that you have a light switch at the top of the stairs and the bottom of the stairs. If you do not have them, ask someone to add them for you. What else can I do to help prevent falls? Wear shoes that: Do not have high heels. Have rubber bottoms. Are comfortable and fit you well. Are closed at the toe. Do not wear sandals. If you use a stepladder: Make sure that it is fully opened. Do not climb a  closed stepladder. Make sure that both sides of the stepladder are locked into place. Ask someone to hold it for you, if possible. Clearly mark and make sure that you can see: Any grab bars or handrails. First and last steps. Where the edge of each step is. Use tools that help you move around (mobility aids) if they are needed. These include: Canes. Walkers. Scooters. Crutches. Turn on the lights when you go into a dark area. Replace any light bulbs as soon as they burn out. Set up your furniture so you have a clear path. Avoid moving your furniture around. If any of your floors are uneven, fix them. If there are any pets around you, be aware of where they are. Review your medicines with your doctor. Some medicines can make you feel dizzy. This can increase your chance of falling. Ask your doctor what other things that you can do to help prevent falls. This information is not intended to replace advice given to you by your health care provider. Make sure you discuss any questions you have with your health care provider. Document Released: 07/08/2009 Document Revised: 02/17/2016 Document Reviewed: 10/16/2014 Elsevier Interactive Patient Education  2017 [employer].

## 2022-03-09 ENCOUNTER — Other Ambulatory Visit: Payer: Self-pay | Admitting: Family Medicine

## 2022-03-09 ENCOUNTER — Telehealth: Payer: Self-pay

## 2022-03-09 ENCOUNTER — Ambulatory Visit (INDEPENDENT_AMBULATORY_CARE_PROVIDER_SITE_OTHER): Payer: Medicare Other

## 2022-03-09 VITALS — BP 120/60 | HR 60 | Ht 68.5 in | Wt 200.6 lb

## 2022-03-09 DIAGNOSIS — Z0000 Encounter for general adult medical examination without abnormal findings: Secondary | ICD-10-CM

## 2022-03-09 DIAGNOSIS — I10 Essential (primary) hypertension: Secondary | ICD-10-CM

## 2022-03-09 MED ORDER — AMLODIPINE BESYLATE 5 MG PO TABS: 5.0000 mg | ORAL_TABLET | Freq: Every day | ORAL | 3 refills | Status: DC

## 2022-03-09 NOTE — Telephone Encounter (Signed)
Here for AWV. Is in need of a refill on Amlodipine '5mg'$ . BP 120/68, HR 60. Thank you.

## 2022-03-09 NOTE — Progress Notes (Signed)
Subjective:   Travis Singh is a 76 y.o. male who presents for Medicare Annual/Subsequent preventive examination. IN OFFICE VISIT. Review of Systems     Cardiac Risk Factors include: advanced age (>58mn, >>64women);hypertension;diabetes mellitus;male gender;sedentary lifestyle;obesity (BMI >30kg/m2);Other (see comment), Risk factor comments: prostate ca, sciatica     Objective:    Today's Vitals   03/09/22 0822  BP: 120/60  Pulse: 60  SpO2: 98%  Weight: 200 lb 9.6 oz (91 kg)  Height: 5' 8.5" (1.74 m)   Body mass index is 30.06 kg/m.     03/09/2022    8:35 AM 02/24/2021    9:52 AM 02/24/2021    9:24 AM 12/10/2020    1:09 PM 06/20/2018    6:38 AM  Advanced Directives  Does Patient Have a Medical Advance Directive? Yes Yes Yes No No  Type of [place] will [place]Living will HTaltyLiving will    Does patient want to make changes to medical advance directive?  No - Patient declined No - Patient declined    Copy of [place] Chart? No - copy requested No - copy requested     Would patient like information on creating a medical advance directive?    No - Patient declined No - Patient declined    Current Medications (verified) Outpatient Encounter Medications as of 03/09/2022  Medication Sig   allopurinol (ZYLOPRIM) 300 MG tablet Take 1 tablet by mouth once daily   amLODipine (NORVASC) 5 MG tablet Take 1 tablet by mouth once daily   atorvastatin (LIPITOR) 40 MG tablet Take 1 tablet by mouth once daily   colchicine 0.6 MG tablet Take 1 tablet by mouth once daily   dapagliflozin propanediol (FARXIGA) 10 MG TABS tablet Take 1 tablet (10 mg total) by mouth daily before breakfast.   hydrochlorothiazide (HYDRODIURIL) 25 MG tablet Take 1 tablet by mouth once daily (Patient not taking: Reported on 03/09/2022)   lisinopril (ZESTRIL) 40 MG tablet Take 1 tablet by mouth once daily  (Patient not taking: Reported on 03/09/2022)   No facility-administered encounter medications on file as of 03/09/2022.    Allergies (verified) Patient has no known allergies.   History: Past Medical History:  Diagnosis Date   Bulging lumbar disc    multiple levels   Diabetes mellitus type 2 with complications ([facility]    ED (erectile dysfunction)    Gout    History of adenomatous polyp of colon    Hyperlipidemia    Hypertension    Left carotid artery stenosis <50%   Left sciatic nerve pain    Lumbar stenosis    Nocturia    Prostate cancer ([facility] urologist-  dr wrenn/ oncologist-  dr mTammi Klippel  dx 01-30-2017 w/ Gleason 3+3, PSA 4.59, active survillance/  biopsy 02-06-2018 , Stage T1c, Gleason 3+4, PSA 7.08-- scheduled for radioactive seed implants   Thyroid mass    see [place]   Wears glasses    Past Surgical History:  Procedure Laterality Date   CYSTOSCOPY  06/20/2018   Procedure: CYSTOSCOPY FLEXIBLE;  Surgeon: WIrine Seal MD;  [facility]  Service: Urology;;  NO SEEDS FOUND IN BLADDER   NO PAST SURGERIES     PROSTATE BIOPSY  01-30-2017;  02-06-2018  dr wJeffie Pollockoffice   RADIOACTIVE SEED IMPLANT N/A 06/20/2018   Procedure: RADIOACTIVE SEED IMPLANT/BRACHYTHERAPY IMPLANT;  Surgeon: WIrine Seal MD;  [place]  Service: Urology;  Laterality: N/A;   86   SEEDS IMPLANTED   SPACE OAR INSTILLATION N/A 06/20/2018   Procedure: SPACE OAR INSTILLATION;  Surgeon: Irine Seal, MD;  [facility];  Service: Urology;  Laterality: N/A;   Family History  Problem Relation Age of Onset   Pancreatic cancer Mother    Leukemia Father    Lung cancer Brother    Lung cancer Brother    Lung cancer Brother    Social History   Socioeconomic History   Marital status: Married    Spouse name: Not on file   Number of children: Not on file   Years of education: Not on file   Highest education level: Not on file  Occupational  History   Occupation: Nature conservation officer  Tobacco Use   Smoking status: Some Days    Types: Cigars   Smokeless tobacco: Never   Tobacco comments:    06-13-2018  per pt every once in a while , cigar  Vaping Use   Vaping Use: Never used  Substance and Sexual Activity   Alcohol use: Not Currently    Alcohol/week: 7.0 standard drinks of alcohol    Types: 7 Cans of beer per week   Drug use: No   Sexual activity: Not Currently  Other Topics Concern   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    Social Determinants of Health   Financial Resource Strain: Low Risk  (03/09/2022)   Overall Financial Resource Strain (CARDIA)    Difficulty of Paying Living Expenses: Not hard at all  Food Insecurity: No Food Insecurity (03/09/2022)   Hunger Vital Sign    Worried About Running Out of Food in the Last Year: Never true    Ran Out of Food in the Last Year: Never true  Transportation Needs: No Transportation Needs (03/09/2022)   PRAPARE - Hydrologist (Medical): No    Lack of Transportation (Non-Medical): No  Physical Activity: Sufficiently Active (03/09/2022)   Exercise Vital Sign    Days of Exercise per Week: 3 days    Minutes of Exercise per Session: 60 min  Stress: No Stress Concern Present (03/09/2022)   [place]    Feeling of Stress : Not at all  Social Connections: [place] (03/09/2022)   Social Connection and Isolation Panel [NHANES]    Frequency of Communication with Friends and Family: More than three times a week    Frequency of Social Gatherings with Friends and Family: More than three times a week    Attends Religious Services: More than 4 times per year    Active Member of [employer] or Organizations: Yes    Attends Music therapist: More than 4 times per year    Marital Status: Married    Tobacco  Counseling Ready to quit: Not Answered Counseling given: Not Answered Tobacco comments: 06-13-2018  per pt every once in a while , cigar   Clinical Intake:  Pre-visit preparation completed: Yes  Pain : No/denies pain     BMI - recorded: 30.06 Nutritional Status: BMI > 30  Obese Nutritional Risks: None Diabetes: No  How often do you need to have someone help you when you read instructions, pamphlets, or other written materials from your doctor or pharmacy?: 1 - Never  Diabetic?NO. PT STATES HE IS PRE-DIABETIC.  Interpreter Needed?: No  Information entered by :: mj Lakela Kuba, lpn   Activities of Daily Living    03/09/2022    8:37 AM  In your present state of health, do you have any difficulty performing the following activities:  Hearing? 0  Vision? 0  Difficulty concentrating or making decisions? 0  Walking or climbing stairs? 0  Dressing or bathing? 0  Doing errands, shopping? 0  Preparing Food and eating ? N  Using the Toilet? N  In the past six months, have you accidently leaked urine? N  Do you have problems with loss of bowel control? N  Managing your Medications? N  Managing your Finances? N  Housekeeping or managing your Housekeeping? N    Patient Care Team: [name], MD as PCP - General (Family Medicine) Edythe Clarity, [facility] as Pharmacist (Pharmacist)  Indicate any recent Medical Services you may have received from other than [facility] providers in the past year (date may be approximate).     Assessment:   This is a routine wellness examination for [place].  Hearing/Vision screen Hearing Screening - Comments:: No hearing issues. Vision Screening - Comments:: Glasses. 2022.  Dietary issues and exercise activities discussed: Current Exercise Habits: The patient has a physically strenuous job, but has no regular exercise apart from work., Exercise limited by: cardiac condition(s)   Goals Addressed             This Visit's Progress     Monitor and Manage My Blood Sugar-Diabetes Type 2   On track    Timeframe:  Long-Range Goal Priority:  High Start Date:  12/29/21                            Expected End Date: 06/30/22                      Follow Up Date 03/30/22    - check blood sugar at prescribed times - enter blood sugar readings and medication or insulin into daily log - take the blood sugar log to all doctor visits    Why is this important?   Checking your blood sugar at home helps to keep it from getting very high or very low.  Writing the results in a diary or log helps the doctor know how to care for you.  Your blood sugar log should have the time, date and the results.  Also, write down the amount of insulin or other medicine that you take.  Other information, like what you ate, exercise done and how you were feeling, will also be helpful.     Notes:      Patient Stated   On track    03/09/2022-Continue to move and stay healthy. 10 lb weight loss       Depression Screen    03/09/2022    8:31 AM 05/15/2019   10:31 AM 03/13/2018    7:38 AM 11/14/2016    8:51 AM 01/21/2016    8:03 AM  PHQ 2/9 Scores  PHQ - 2 Score 0 0 0 0 0  PHQ- 9 Score     0    Fall Risk    03/09/2022    8:36 AM 02/24/2021    9:29 AM 05/15/2019   10:34 AM 04/25/2018    4:06 PM 03/13/2018    7:38 AM  Fall Risk   Falls in the past year? 0 0 0 No No  Comment  Emmi Telephone Survey: data to providers prior to load   Number falls in past yr: 0 0     Injury with Fall? 0 0     Risk for fall due to : No Fall Risks No Fall Risks     Follow up Falls prevention discussed Falls prevention discussed;Falls evaluation completed Falls evaluation completed      FALL RISK PREVENTION PERTAINING TO THE HOME:  Any stairs in or around the home? Yes  If so, are there any without handrails? No  Home free of loose throw rugs in walkways, pet beds, electrical cords, etc? Yes  Adequate lighting in your home to reduce risk of falls? Yes   ASSISTIVE  DEVICES UTILIZED TO PREVENT FALLS:  Life alert? No  Use of a cane, walker or w/c? No  Grab bars in the bathroom? Yes  Shower chair or bench in shower? Yes  Elevated toilet seat or a handicapped toilet? Yes   TIMED UP AND GO:  Was the test performed? Yes .  Length of time to ambulate 10 feet: 10 sec.   Gait steady and fast without use of assistive device  Cognitive Function:        03/09/2022    8:37 AM  6CIT Screen  What Year? 0 points  What month? 0 points  What time? 0 points  Count back from 20 0 points  Months in reverse 0 points  Repeat phrase 2 points  Total Score 2 points    Immunizations Immunization History  Administered Date(s) Administered   Fluad Quad(high Dose 65+) 05/15/2019, 07/08/2020   Influenza, High Dose Seasonal PF 07/04/2017, 08/01/2018   PFIZER(Purple Top)SARS-COV-2 Vaccination 08/02/2020   Pneumococcal Conjugate-13 02/12/2015   Pneumococcal Polysaccharide-23 01/20/2011    TDAP status: Due, Education has been provided regarding the importance of this vaccine. Advised may receive this vaccine at local pharmacy or Health Dept. Aware to provide a copy of the vaccination record if obtained from local pharmacy or Health Dept. Verbalized acceptance and understanding.  Flu Vaccine status: Due, Education has been provided regarding the importance of this vaccine. Advised may receive this vaccine at local pharmacy or Health Dept. Aware to provide a copy of the vaccination record if obtained from local pharmacy or Health Dept. Verbalized acceptance and understanding.  Pneumococcal vaccine status: Up to date  Covid-19 vaccine status: Completed vaccines  Qualifies for Shingles Vaccine? Yes   Zostavax completed No   Shingrix Completed?: No.    Education has been provided regarding the importance of this vaccine. Patient has been advised to call insurance company to determine out of pocket expense if they have not yet received this vaccine. Advised may also  receive vaccine at local pharmacy or Health Dept. Verbalized acceptance and understanding.  Screening Tests Health Maintenance  Topic Date Due   TETANUS/TDAP  Never done   Zoster Vaccines- Shingrix (1 of 2) Never done   FOOT EXAM  10/13/2016   COVID-19 Vaccine (2 - Pfizer risk series) 08/23/2020   OPHTHALMOLOGY EXAM  04/21/2022 (Originally 12/11/1955)   Hepatitis C Screening  04/21/2022 (Originally 12/11/1963)   INFLUENZA VACCINE  04/25/2022   HEMOGLOBIN A1C  05/08/2022   Pneumonia Vaccine 42+ Years old  Completed   HPV VACCINES  Aged Out   COLONOSCOPY (Pts 45-60yr Insurance coverage will need to be confirmed)  Discontinued    Health Maintenance  Health Maintenance Due  Topic Date Due   TETANUS/TDAP  Never done   Zoster Vaccines- Shingrix (1 of 2) Never  done   FOOT EXAM  10/13/2016   COVID-19 Vaccine (2 - Pfizer risk series) 08/23/2020    Colorectal cancer screening: No longer required.   Lung Cancer Screening: (Low Dose CT Chest recommended if Age 62-80 years, 30 pack-year currently smoking OR have quit w/in 15years.) qualify.  Additional Screening:  Hepatitis C Screening: does qualify; Completed due  Vision Screening: Recommended annual ophthalmology exams for early detection of glaucoma and other disorders of the eye. Is the patient up to date with their annual eye exam?  Yes  Who is the provider or what is the name of the office in which the patient attends annual eye exams? Pt unable to remember name of Provider. If pt is not established with a provider, would they like to be referred to a provider to establish care? No .   Dental Screening: Recommended annual dental exams for proper oral hygiene  Community Resource Referral / Chronic Care Management: CRR required this visit?  No   CCM required this visit?  No      Plan:     I have personally reviewed and noted the following in the patient's chart:   Medical and social history Use of alcohol, tobacco or  illicit drugs  Current medications and supplements including opioid prescriptions. Patient is not currently taking opioid prescriptions. Functional ability and status Nutritional status Physical activity Advanced directives List of other physicians Hospitalizations, surgeries, and ER visits in previous 12 months Vitals Screenings to include cognitive, depression, and falls Referrals and appointments  In addition, I have reviewed and discussed with patient certain preventive protocols, quality metrics, and best practice recommendations. A written personalized care plan for preventive services as well as general preventive health recommendations were provided to patient.     Chriss Driver, LPN   [id number]   Nurse Notes: Discussed Shingrix, Tdap and how to obtain.

## 2022-05-01 DIAGNOSIS — Z8546 Personal history of malignant neoplasm of prostate: Secondary | ICD-10-CM | POA: Diagnosis not present

## 2022-05-08 DIAGNOSIS — N5201 Erectile dysfunction due to arterial insufficiency: Secondary | ICD-10-CM | POA: Diagnosis not present

## 2022-05-08 DIAGNOSIS — Z8546 Personal history of malignant neoplasm of prostate: Secondary | ICD-10-CM | POA: Diagnosis not present

## 2022-05-08 DIAGNOSIS — R351 Nocturia: Secondary | ICD-10-CM | POA: Diagnosis not present

## 2022-05-11 ENCOUNTER — Other Ambulatory Visit: Payer: Self-pay | Admitting: Family Medicine

## 2022-05-12 NOTE — Telephone Encounter (Signed)
Requested medication (s) are due for refill today: yes  Requested medication (s) are on the active medication list: yes  Last refill:  02/08/22 #90 with 0 RF  Future visit scheduled: no, seen 11/08/21  Notes to clinic:  Current med list has note that pt states he is not taking Lisinopril at this time. Please assess.      Requested Prescriptions  Pending Prescriptions Disp Refills   lisinopril (ZESTRIL) 40 MG tablet [Pharmacy Med Name: Lisinopril 40 MG Oral Tablet] 90 tablet 0    Sig: Take 1 tablet by mouth once daily     Cardiovascular:  ACE Inhibitors Failed - 05/11/2022  9:04 AM      Failed - Cr in normal range and within 180 days    Creat  Date Value Ref Range Status  11/08/2021 1.46 (H) 0.70 - 1.28 mg/dL Final   Creatinine, Urine  Date Value Ref Range Status  11/08/2021 157 20 - 320 mg/dL Final         Failed - K in normal range and within 180 days    Potassium  Date Value Ref Range Status  11/08/2021 4.1 3.5 - 5.3 mmol/L Final         Failed - Valid encounter within last 6 months    Recent Outpatient Visits           6 months ago Diabetes mellitus type 2 with complications ([place])   [place] Pickard, Cammie Mcgee, MD   1 year ago Left hand pain   [place] Schaumann, Cammie Mcgee, MD   1 year ago Left hand pain   [place] Denn[place] Schaumann, Cammie Mcgee, MD   1 year ago [place], Cammie Mcgee, MD   1 year ago Benign essential HTN   [place], Cammie Mcgee, MD              Passed - Patient is not pregnant      Passed - Last BP in normal range    BP Readings from Last 1 Encounters:  03/09/22 120/60

## 2022-07-03 ENCOUNTER — Ambulatory Visit (INDEPENDENT_AMBULATORY_CARE_PROVIDER_SITE_OTHER): Payer: Medicare Other

## 2022-07-03 DIAGNOSIS — Z23 Encounter for immunization: Secondary | ICD-10-CM | POA: Diagnosis not present

## 2022-11-06 DIAGNOSIS — Z8546 Personal history of malignant neoplasm of prostate: Secondary | ICD-10-CM | POA: Diagnosis not present

## 2022-11-09 ENCOUNTER — Other Ambulatory Visit: Payer: Self-pay | Admitting: Family Medicine

## 2022-11-09 NOTE — Telephone Encounter (Signed)
Requested medications are due for refill today.  yes  Requested medications are on the active medications list.  yes  Last refill. 02/08/2022 #90 2 rf  Future visit scheduled.   no  Notes to clinic.  Labs are expired. PT needs an appt.    Requested Prescriptions  Pending Prescriptions Disp Refills   colchicine 0.6 MG tablet [Pharmacy Med Name: Colchicine 0.6 MG Oral Tablet] 90 tablet 0    Sig: Take 1 tablet by mouth once daily     Endocrinology:  Gout Agents - colchicine Failed - 11/09/2022  9:31 AM      Failed - Cr in normal range and within 360 days    Creat  Date Value Ref Range Status  11/08/2021 1.46 (H) 0.70 - 1.28 mg/dL Final   Creatinine, Urine  Date Value Ref Range Status  11/08/2021 157 20 - 320 mg/dL Final         Failed - ALT in normal range and within 360 days    ALT  Date Value Ref Range Status  11/08/2021 23 9 - 46 U/L Final         Failed - AST in normal range and within 360 days    AST  Date Value Ref Range Status  11/08/2021 23 10 - 35 U/L Final         Failed - Valid encounter within last 12 months    Recent Outpatient Visits           1 year ago Diabetes mellitus type 2 with complications ([place])   [place] Pickard, Cammie Mcgee, MD   1 year ago Left hand pain   [place] Denn[place] Schaumann, Cammie Mcgee, MD   2 years ago Left hand pain   [place] Pickard, Cammie Mcgee, MD   2 years ago [place], Cammie Mcgee, MD   2 years ago Benign essential HTN   [place] Pickard, Cammie Mcgee, MD              Failed - CBC within normal limits and completed in the last 12 months    WBC  Date Value Ref Range Status  11/08/2021 4.6 3.8 - 10.8 Thousand/uL Final   RBC  Date Value Ref Range Status  11/08/2021 4.82 4.20 - 5.80 Million/uL Final   Hemoglobin  Date Value Ref Range Status  11/08/2021 13.8 13.2 - 17.1 g/dL Final   HCT  Date Value Ref Range Status   11/08/2021 41.7 38.5 - 50.0 % Final   MCHC  Date Value Ref Range Status  11/08/2021 33.1 32.0 - 36.0 g/dL Final   [facility]  Date Value Ref Range Status  11/08/2021 28.6 27.0 - 33.0 pg Final   MCV  Date Value Ref Range Status  11/08/2021 86.5 80.0 - 100.0 fL Final   No results found for: "PLTCOUNTKUC", "LABPLAT", "POCPLA" RDW  Date Value Ref Range Status  11/08/2021 13.8 11.0 - 15.0 % Final

## 2022-11-13 DIAGNOSIS — N471 Phimosis: Secondary | ICD-10-CM | POA: Diagnosis not present

## 2022-11-13 DIAGNOSIS — N476 Balanoposthitis: Secondary | ICD-10-CM | POA: Diagnosis not present

## 2022-11-13 DIAGNOSIS — Z8546 Personal history of malignant neoplasm of prostate: Secondary | ICD-10-CM | POA: Diagnosis not present

## 2022-11-13 DIAGNOSIS — N5201 Erectile dysfunction due to arterial insufficiency: Secondary | ICD-10-CM | POA: Diagnosis not present

## 2022-12-02 ENCOUNTER — Other Ambulatory Visit: Payer: Self-pay | Admitting: Family Medicine

## 2022-12-06 ENCOUNTER — Other Ambulatory Visit: Payer: Self-pay | Admitting: Family Medicine

## 2022-12-13 DIAGNOSIS — N471 Phimosis: Secondary | ICD-10-CM | POA: Diagnosis not present

## 2022-12-13 DIAGNOSIS — Z8546 Personal history of malignant neoplasm of prostate: Secondary | ICD-10-CM | POA: Diagnosis not present

## 2022-12-27 ENCOUNTER — Telehealth: Payer: Self-pay | Admitting: Pharmacist

## 2022-12-27 NOTE — Progress Notes (Signed)
Care Management & Coordination Services Pharmacy Team   Reason for Encounter: Appointment Reminder   Contacted patient to confirm telephone appointment with Leata Mouse, PharmD on 12/28/22 at [place]. Unsuccessful outreach. Left voicemail for patient to return call.  Do you have any problems getting your medications?  If yes what types of problems are you experiencing?   What is your top health concern you would like to discuss at your upcoming visit?   Have you seen any other providers since your last visit with PCP?     Future Appointments  Date Time Provider [place]  12/28/2022  3:00 PM Edythe Clarity, [place] [facility] None  03/15/2023  8:30 AM [facility]-ANNUAL WELLNESS VISIT [facility] [place] [place]

## 2022-12-28 ENCOUNTER — Ambulatory Visit: Payer: Medicare Other | Admitting: Pharmacist

## 2022-12-28 DIAGNOSIS — H40033 Anatomical narrow angle, bilateral: Secondary | ICD-10-CM | POA: Diagnosis not present

## 2022-12-28 DIAGNOSIS — H2513 Age-related nuclear cataract, bilateral: Secondary | ICD-10-CM | POA: Diagnosis not present

## 2022-12-28 NOTE — Progress Notes (Signed)
Care Management & Coordination Services Pharmacy Note  12/29/2022 Name:  Travis Singh MRN:  [id number] DOB:  Dec 04, 1945  Summary: PharmD FU visit.  Patient doing well controlling chronic conditions.  HE is overdue for physical.  Had him agree to call to scheduled one.  Replaced Actos with Marcelline Deist and tolerating well - copay is affordable.  Recommendations/Changes made from today's visit: Due for A1c, UACR  Follow up plan: FU 6 months   Subjective: Travis Singh is an 77 y.o. year old male who is a primary patient of Pickard, Priscille Heidelberg, MD.  The care coordination team was consulted for assistance with disease management and care coordination needs.    Engaged with patient by telephone for follow up visit.  Recent office visits:  11/08/2021 OV (PCP) Donita Brooks, MD;  I believe he would benefit from taking Farxiga 10 mg a day.  This has been shown in studies to reduce his all cause mortality.  It also helps reduce the progression of chronic kidney disease as well as manage his diabetes.  Patient is willing to try this.  Begin 10 mg a day.     Recent consult visits:  None   Hospital visits:  None in previous 6 months   Objective:  Lab Results  Component Value Date   CREATININE 1.46 (H) 11/08/2021   BUN 18 11/08/2021   EGFR 49 (L) 11/08/2021   GFRNONAA 40 (L) 08/13/2020   GFRAA 46 (L) 08/13/2020   NA 143 11/08/2021   K 4.1 11/08/2021   CALCIUM 8.8 11/08/2021   CO2 26 11/08/2021   GLUCOSE 130 (H) 11/08/2021    Lab Results  Component Value Date/Time   HGBA1C 6.8 (H) 11/08/2021 09:26 AM   HGBA1C 6.9 (H) 08/13/2020 12:52 PM   MICROALBUR 118.0 11/14/2016 08:57 AM   MICROALBUR 74.9 04/24/2016 09:01 AM    Last diabetic Eye exam: No results found for: "HMDIABEYEEXA"  Last diabetic Foot exam: No results found for: "HMDIABFOOTEX"   Lab Results  Component Value Date   CHOL 171 11/08/2021   HDL 48 11/08/2021   LDLCALC 100 (H) 11/08/2021   TRIG 136 11/08/2021    CHOLHDL 3.6 11/08/2021       Latest Ref Rng & Units 11/08/2021    9:26 AM 08/13/2020   12:52 PM 05/24/2020    8:38 AM  Hepatic Function  Total Protein 6.1 - 8.1 g/dL 6.3  6.9  6.5   AST 10 - 35 U/L 23  21  18    ALT 9 - 46 U/L 23  24  21    Total Bilirubin 0.2 - 1.2 mg/dL 0.7  0.6  0.5     Lab Results  Component Value Date/Time   TSH 0.91 08/13/2020 12:52 PM   FREET4 1.0 08/13/2020 12:52 PM       Latest Ref Rng & Units 11/08/2021    9:26 AM 08/13/2020   12:52 PM 05/24/2020    8:38 AM  CBC  WBC 3.8 - 10.8 Thousand/uL 4.6  4.8  4.2   Hemoglobin 13.2 - 17.1 g/dL 12.4  58.0  99.8   Hematocrit 38.5 - 50.0 % 41.7  41.8  43.1   Platelets 140 - 400 Thousand/uL 183  181  181     No results found for: "VD25OH", "VITAMINB12"  Clinical ASCVD: Yes  The 10-year ASCVD risk score (Arnett DK, et al., 2019) is: 55.1%   Values used to calculate the score:     Age: 57 years  Sex: Male     Is Non-Hispanic African American: Yes     Diabetic: Yes     Tobacco smoker: Yes     Systolic Blood Pressure: 120 mmHg     Is BP treated: Yes     HDL Cholesterol: 48 mg/dL     Total Cholesterol: 171 mg/dL        3/95/3202    3:34 AM 05/15/2019   10:31 AM 03/13/2018    7:38 AM  Depression screen PHQ 2/9  Decreased Interest 0 0 0  Down, Depressed, Hopeless 0 0 0  PHQ - 2 Score 0 0 0     Social History   Tobacco Use  Smoking Status Some Days   Types: Cigars  Smokeless Tobacco Never  Tobacco Comments   06-13-2018  per pt every once in a while , cigar   BP Readings from Last 3 Encounters:  03/09/22 120/60  11/08/21 (!) 138/92  02/24/21 124/84   Pulse Readings from Last 3 Encounters:  03/09/22 60  11/08/21 68  11/12/20 62   Wt Readings from Last 3 Encounters:  03/09/22 200 lb 9.6 oz (91 kg)  11/08/21 204 lb (92.5 kg)  02/24/21 208 lb 6.4 oz (94.5 kg)   BMI Readings from Last 3 Encounters:  03/09/22 30.06 kg/m  11/08/21 30.57 kg/m  02/24/21 31.23 kg/m    No Known  Allergies  Medications Reviewed Today     Reviewed by Erroll Luna, [facility] (Pharmacist) on 12/29/22 at [phone]  Med List Status: <None>   Medication Order Taking? Sig Documenting Provider Last Dose Status Informant  allopurinol (ZYLOPRIM) 300 MG tablet [id number] Yes Take 1 tablet by mouth once daily Donita Brooks, MD Taking Active   amLODipine (NORVASC) 5 MG tablet [id number] Yes Take 1 tablet (5 mg total) by mouth daily. Donita Brooks, MD Taking Active   atorvastatin (LIPITOR) 40 MG tablet [id number] Yes Take 1 tablet by mouth once daily Donita Brooks, MD Taking Active   colchicine 0.6 MG tablet [id number] Yes Take 1 tablet by mouth once daily Donita Brooks, MD Taking Active   dapagliflozin propanediol (FARXIGA) 10 MG TABS tablet [id number] Yes Take 1 tablet (10 mg total) by mouth daily before breakfast. Donita Brooks, MD Taking Active   hydrochlorothiazide (HYDRODIURIL) 25 MG tablet [id number] Yes Take 1 tablet by mouth once daily Donita Brooks, MD Taking Active   lisinopril (ZESTRIL) 40 MG tablet [id number] Yes Take 1 tablet by mouth once daily Donita Brooks, MD Taking Active             SDOH:  (Social Determinants of Health) assessments and interventions performed: No, done within year Financial Resource Strain: Low Risk  (03/09/2022)   Overall Financial Resource Strain (CARDIA)    Difficulty of Paying Living Expenses: Not hard at all   Food Insecurity: No Food Insecurity (03/09/2022)   Hunger Vital Sign    Worried About Running Out of Food in the Last Year: Never true    Ran Out of Food in the Last Year: Never true    SDOH Interventions    Flowsheet Row Clinical Support from 03/09/2022 in [facility] Clinical Support from 02/24/2021 in [facility]  SDOH Interventions    Food Insecurity Interventions Intervention Not Indicated Intervention Not Indicated  Housing Interventions Intervention Not Indicated  Intervention Not Indicated  Transportation Interventions Intervention Not Indicated Intervention Not Indicated  Financial Strain Interventions Intervention Not Indicated Intervention Not  Indicated  Physical Activity Interventions Intervention Not Indicated --  Stress Interventions Intervention Not Indicated --  Social Connections Interventions Intervention Not Indicated Intervention Not Indicated       Medication Assistance: None required.  Patient affirms current coverage meets needs.  Medication Access: Within the past 30 days, how often has patient missed a dose of medication? 0 Is a pillbox or other method used to improve adherence? No  Factors that may affect medication adherence? no barriers identified Are meds synced by current pharmacy? No  Are meds delivered by current pharmacy? No  Does patient experience delays in picking up medications due to transportation concerns? No   Upstream Services Reviewed: Is patient disadvantaged to use [facility]?: Yes  Current Rx insurance plan: Medicare Name and location of Current pharmacy:  [id number] [place] ([place]),  - [address] [place] ([place]) [place] [address] Phone: [phone] Fax: [phone]  [facility] services reviewed with patient today?: Yes  Patient requests to transfer care to [facility]?: No  Reason patient declined to change pharmacies: Loyalty to other pharmacy/Patient preference  Compliance/Adherence/Medication fill history: Care Gaps: A1c, UACR, Foot exam  Star-Rating Drugs: Atorvastatin 40mg  12/04/22 90ds Lisinopril 40mg  11/09/22 90ds   Assessment/Plan     Hypertension (BP goal <130/80) 12/28/22 -Controlled -Current treatment: Amlodipine 5mg  daily Appropriate, Effective, Safe, Accessible Lisinopril 40mg  daily Appropriate, Effective, Safe, Accessible HCTZ 25mg  Appropriate, Effective, Safe, Accessible -Medications previously tried: HCTZ   -Current home readings: daughter checks at home, he is unaware of readings -Current dietary habits: see DM -Current exercise habits: still very active working at home.  Does yard work, rides tractors, Catering manager. -Denies hypotensive/hypertensive symptoms -Educated on BP goals and benefits of medications for prevention of heart attack, stroke and kidney damage; BP well controlled - sill active around the house.  Denies any swelling with amlodipine.  Due for updated physical, patient to make appointment. 100% adherehnce, continue medication as is.  Hyperlipidemia: (LDL goal < 100) -Not ideally controlled -Current treatment: Atorvastatin 40mg  daily Appropriate, Query effective -Medications previously tried: none noted  -Current dietary patterns: see DM -Current exercise habits: see HTn -Educated on Cholesterol goals;  Benefits of statin for ASCVD risk reduction; Importance of limiting foods high in cholesterol; -Recommended to continue current medication Work on diet limiting things like sausage, bacon. Reports adherence on medication - fill dates support adherence. Recommend continue current dose - continue routine screenings.  ASCVD very high > 70%.   Diabetes (A1c goal <7%) 04/04/024 -Controlled -Current medications: Farxiga 10mg  Appropriate, Effective, Safe, Accessible -Medications previously tried: pioglitazone -Current home glucose readings fasting glucose: unsure of readings post prandial glucose: unsure of readings -Denies hypoglycemic/hyperglycemic symptoms -Current meal patterns:  breakfast: bacon sausage  lunch: chicken, vegetables  dinner: some vegetables, trying to limit red meats snacks:  drinks: sodas, water -Current exercise: working around [employer], riding tractors, Catering manager. -Educated on A1c and blood sugar goals; Complications of diabetes including kidney damage, retinal damage, and cardiovascular disease; Prevention and management of hypoglycemic episodes; Benefits  of routine self-monitoring of blood sugar; He is well overdue for updated lab work, A1c, kidney function, etc.  Asked patient to call and schedule a FU with PCP forall of this.  He is agreeable.  Replaced Actos with Marcelline Deist and tolerating well.  Unsure of recent glucose readings.  Continue current medications and adjust once we get new A1c and UACR. No changes at this time.       Willa Frater, PharmD,  CPP Clinical Pharmacist Practitioner [place] [phone]

## 2023-01-07 ENCOUNTER — Other Ambulatory Visit: Payer: Self-pay | Admitting: Family Medicine

## 2023-01-09 ENCOUNTER — Other Ambulatory Visit: Payer: Self-pay | Admitting: Family Medicine

## 2023-01-09 NOTE — Telephone Encounter (Signed)
Requested medications are due for refill today.  yes  Requested medications are on the active medications list.  yes  Last refill. 11/09/2022 #60 0 rf  Future visit scheduled.   no  Notes to clinic.  Labs are expired. PT needs an OV.    Requested Prescriptions  Pending Prescriptions Disp Refills   colchicine 0.6 MG tablet [Pharmacy Med Name: Colchicine 0.6 MG Oral Tablet] 60 tablet 0    Sig: TAKE 1 TABLET BY MOUTH ONCE DAILY NEED APPOINTMENT WITH PCP     Endocrinology:  Gout Agents - colchicine Failed - 01/07/2023  4:42 PM      Failed - Cr in normal range and within 360 days    Creat  Date Value Ref Range Status  11/08/2021 1.46 (H) 0.70 - 1.28 mg/dL Final   Creatinine, Urine  Date Value Ref Range Status  11/08/2021 157 20 - 320 mg/dL Final         Failed - ALT in normal range and within 360 days    ALT  Date Value Ref Range Status  11/08/2021 23 9 - 46 U/L Final         Failed - AST in normal range and within 360 days    AST  Date Value Ref Range Status  11/08/2021 23 10 - 35 U/L Final         Failed - Valid encounter within last 12 months    Recent Outpatient Visits           1 year ago Diabetes mellitus type 2 with complications (HCC)   [facility] Pickard, Priscille Heidelberg, MD   2 years ago Left hand pain   [facility] Donita Brooks, MD   2 years ago Left hand pain   [facility] Tanya Nones, Priscille Heidelberg, MD   2 years ago Goiter   [facility] Tanya Nones, Priscille Heidelberg, MD   2 years ago Benign essential HTN   [facility] Pickard, Priscille Heidelberg, MD       Future Appointments             In 3 weeks Tanya Nones, Priscille Heidelberg, MD [facility]            Failed - CBC within normal limits and completed in the last 12 months    WBC  Date Value Ref Range Status  11/08/2021 4.6 3.8 - 10.8 Thousand/uL Final   RBC  Date Value Ref Range Status  11/08/2021 4.82 4.20 - 5.80  Million/uL Final   Hemoglobin  Date Value Ref Range Status  11/08/2021 13.8 13.2 - 17.1 g/dL Final   HCT  Date Value Ref Range Status  11/08/2021 41.7 38.5 - 50.0 % Final   MCHC  Date Value Ref Range Status  11/08/2021 33.1 32.0 - 36.0 g/dL Final   [facility]  Date Value Ref Range Status  11/08/2021 28.6 27.0 - 33.0 pg Final   MCV  Date Value Ref Range Status  11/08/2021 86.5 80.0 - 100.0 fL Final   No results found for: "PLTCOUNTKUC", "LABPLAT", "POCPLA" RDW  Date Value Ref Range Status  11/08/2021 13.8 11.0 - 15.0 % Final

## 2023-01-09 NOTE — Telephone Encounter (Signed)
Prescription Request  01/09/2023  LOV: 11/08/21 UPCOMING CPE APPT 02/02/23  What is the name of the medication or equipment? colchicine 0.6 MG tablet  Have you contacted your pharmacy to request a refill? Yes   Which pharmacy would you like this sent to?  [facility] [address] ([address]), [place] - [address] [place]) [place] [address] Phone: [phone] Fax: [phone]    Patient notified that their request is being sent to the clinical staff for review and that they should receive a response within 2 business days.   Please advise at [facility] [phone]

## 2023-01-16 ENCOUNTER — Other Ambulatory Visit: Payer: Self-pay | Admitting: Family Medicine

## 2023-01-17 ENCOUNTER — Other Ambulatory Visit: Payer: Self-pay

## 2023-01-17 DIAGNOSIS — E118 Type 2 diabetes mellitus with unspecified complications: Secondary | ICD-10-CM

## 2023-01-17 MED ORDER — DAPAGLIFLOZIN PROPANEDIOL 10 MG PO TABS: 10.0000 mg | ORAL_TABLET | Freq: Every day | ORAL | 0 refills | Status: DC

## 2023-02-02 ENCOUNTER — Encounter: Payer: Self-pay | Admitting: Family Medicine

## 2023-02-02 ENCOUNTER — Ambulatory Visit (INDEPENDENT_AMBULATORY_CARE_PROVIDER_SITE_OTHER): Payer: Medicare Other | Admitting: Family Medicine

## 2023-02-02 VITALS — BP 160/82 | HR 93 | Temp 98.6°F | Ht 68.0 in | Wt 195.0 lb

## 2023-02-02 DIAGNOSIS — I10 Essential (primary) hypertension: Secondary | ICD-10-CM

## 2023-02-02 DIAGNOSIS — E049 Nontoxic goiter, unspecified: Secondary | ICD-10-CM

## 2023-02-02 DIAGNOSIS — R002 Palpitations: Secondary | ICD-10-CM | POA: Diagnosis not present

## 2023-02-02 DIAGNOSIS — Z1211 Encounter for screening for malignant neoplasm of colon: Secondary | ICD-10-CM

## 2023-02-02 DIAGNOSIS — N183 Chronic kidney disease, stage 3 unspecified: Secondary | ICD-10-CM

## 2023-02-02 DIAGNOSIS — Z7984 Long term (current) use of oral hypoglycemic drugs: Secondary | ICD-10-CM

## 2023-02-02 DIAGNOSIS — E118 Type 2 diabetes mellitus with unspecified complications: Secondary | ICD-10-CM | POA: Diagnosis not present

## 2023-02-02 DIAGNOSIS — I4891 Unspecified atrial fibrillation: Secondary | ICD-10-CM

## 2023-02-02 DIAGNOSIS — Z0000 Encounter for general adult medical examination without abnormal findings: Secondary | ICD-10-CM

## 2023-02-02 MED ORDER — METOPROLOL SUCCINATE ER 25 MG PO TB24: 25.0000 mg | ORAL_TABLET | Freq: Every day | ORAL | 3 refills | Status: AC

## 2023-02-02 MED ORDER — APIXABAN 5 MG PO TABS
5.0000 mg | ORAL_TABLET | Freq: Two times a day (BID) | ORAL | 11 refills | Status: DC
Start: 2023-02-02 — End: 2023-02-05

## 2023-02-02 MED ORDER — DAPAGLIFLOZIN PROPANEDIOL 10 MG PO TABS: 10.0000 mg | ORAL_TABLET | Freq: Every day | ORAL | 0 refills | Status: DC

## 2023-02-02 MED ORDER — HYDROCHLOROTHIAZIDE 25 MG PO TABS: 25.0000 mg | ORAL_TABLET | Freq: Every day | ORAL | 3 refills | Status: DC

## 2023-02-02 NOTE — Progress Notes (Signed)
Subjective:    Patient ID: Travis Singh, male    DOB: 1946-07-14, 77 y.o.   MRN: [id number]  HPI  Patient is a 77 year old Caucasian gentleman with a history of diabetes mellitus type 2, chronic kidney disease, and hypertension.  He is here today for physical exam.  His blood pressure is elevated however the patient has stopped hydrochlorothiazide for unknown reason.  He is due for the shingles shot which she politely declines.  He is due for colon cancer screening and agrees to Cologuard.  Urology is monitoring his PSA.  However when I examined the patient, he has an irregular heartbeat.  As a result, we immediately did an EKG.  EKG confirms atrial fibrillation.  There is no evidence of ischemia or infarction there are no discernible P waves and the patient has an irregularly irregular rhythm. Past Medical History:  Diagnosis Date   Bulging lumbar disc    multiple levels   Diabetes mellitus type 2 with complications [facility])    ED (erectile dysfunction)    Gout    History of adenomatous polyp of colon    Hyperlipidemia    Hypertension    Left carotid artery stenosis <50%   Left sciatic nerve pain    Lumbar stenosis    Nocturia    Prostate cancer [facility]) urologist-  dr wrenn/ oncologist-  dr Kathrynn Running   dx 01-30-2017 w/ Gleason 3+3, PSA 4.59, active survillance/  biopsy 02-06-2018 , Stage T1c, Gleason 3+4, PSA 7.08-- scheduled for radioactive seed implants   Thyroid mass    see [place] 8/20   Wears glasses    Past Surgical History:  Procedure Laterality Date   CYSTOSCOPY  06/20/2018   Procedure: CYSTOSCOPY FLEXIBLE;  Surgeon: Bjorn Pippin, MD;  Location: [facility];  Service: Urology;;  NO SEEDS FOUND IN BLADDER   NO PAST SURGERIES     PROSTATE BIOPSY  01-30-2017;  02-06-2018  dr Annabell Howells office   RADIOACTIVE SEED IMPLANT N/A 06/20/2018   Procedure: RADIOACTIVE SEED IMPLANT/BRACHYTHERAPY IMPLANT;  Surgeon: Bjorn Pippin, MD;  Location: [facility];  Service: Urology;   Laterality: N/A;   86   SEEDS IMPLANTED   SPACE OAR INSTILLATION N/A 06/20/2018   Procedure: SPACE OAR INSTILLATION;  Surgeon: Bjorn Pippin, MD;  Location: [facility];  Service: Urology;  Laterality: N/A;   Current Outpatient Medications on File Prior to Visit  Medication Sig Dispense Refill   amLODipine (NORVASC) 5 MG tablet Take 1 tablet (5 mg total) by mouth daily. 90 tablet 3   atorvastatin (LIPITOR) 40 MG tablet Take 1 tablet by mouth once daily 90 tablet 0   colchicine 0.6 MG tablet TAKE 1 TABLET BY MOUTH ONCE DAILY NEED APPOINTMENT WITH PCP 60 tablet 0   lisinopril (ZESTRIL) 40 MG tablet Take 1 tablet by mouth once daily 90 tablet 3   allopurinol (ZYLOPRIM) 300 MG tablet Take 1 tablet by mouth once daily (Patient not taking: Reported on 02/02/2023) 90 tablet 3   No current facility-administered medications on file prior to visit.   No Known Allergies Social History   Socioeconomic History   Marital status: Married    Spouse name: Not on file   Number of children: Not on file   Years of education: Not on file   Highest education level: Not on file  Occupational History   Occupation: Corporate investment banker  Tobacco Use   Smoking status: Some Days    Types: Cigars   Smokeless tobacco: Never  Tobacco comments:    06-13-2018  per pt every once in a while , cigar  Vaping Use   Vaping Use: Never used  Substance and Sexual Activity   Alcohol use: Not Currently    Alcohol/week: 7.0 standard drinks of alcohol    Types: 7 Cans of beer per week   Drug use: No   Sexual activity: Not Currently  Other Topics Concern   Not on file  Social History Narrative   Married with one daughter and three sons. Works in Holiday representative.    Social Determinants of Health   Financial Resource Strain: Low Risk  (03/09/2022)   Overall Financial Resource Strain (CARDIA)    Difficulty of Paying Living Expenses: Not hard at all  Food Insecurity: No Food Insecurity (03/09/2022)   Hunger  Vital Sign    Worried About Running Out of Food in the Last Year: Never true    Ran Out of Food in the Last Year: Never true  Transportation Needs: No Transportation Needs (03/09/2022)   PRAPARE - Administrator, Civil Service (Medical): No    Lack of Transportation (Non-Medical): No  Physical Activity: Sufficiently Active (03/09/2022)   Exercise Vital Sign    Days of Exercise per Week: 3 days    Minutes of Exercise per Session: 60 min  Stress: No Stress Concern Present (03/09/2022)   [employer] of Occupational Health - Occupational Stress Questionnaire    Feeling of Stress : Not at all  Social Connections: Socially Integrated (03/09/2022)   Social Connection and Isolation Panel [NHANES]    Frequency of Communication with Friends and Family: More than three times a week    Frequency of Social Gatherings with Friends and Family: More than three times a week    Attends Religious Services: More than 4 times per year    Active Member of [employer] or Organizations: Yes    Attends Engineer, structural: More than 4 times per year    Marital Status: Married  Catering manager Violence: Not At Risk (03/09/2022)   Humiliation, Afraid, Rape, and Kick questionnaire    Fear of Current or Ex-Partner: No    Emotionally Abused: No    Physically Abused: No    Sexually Abused: No   Family History  Problem Relation Age of Onset   Pancreatic cancer Mother    Leukemia Father    Lung cancer Brother    Lung cancer Brother    Lung cancer Brother   '    Review of Systems  All other systems reviewed and are negative.      Objective:   Physical Exam Constitutional:      General: He is not in acute distress.    Appearance: Normal appearance. He is obese. He is not ill-appearing, toxic-appearing or diaphoretic.  HENT:     Head: Normocephalic and atraumatic.      Right Ear: External ear normal.     Left Ear: External ear normal.     Nose: Nose normal. No congestion or  rhinorrhea.     Mouth/Throat:     Mouth: Mucous membranes are moist.     Pharynx: Oropharynx is clear. No oropharyngeal exudate.  Eyes:     General: No scleral icterus.       Right eye: No discharge.        Left eye: No discharge.     Extraocular Movements: Extraocular movements intact.     Conjunctiva/sclera: Conjunctivae normal.     Pupils: Pupils are  equal, round, and reactive to light.  Neck:     Thyroid: Thyromegaly present.     Vascular: Carotid bruit present.   Cardiovascular:     Rate and Rhythm: Normal rate. Rhythm irregular.     Pulses: Normal pulses.     Heart sounds: Normal heart sounds. No murmur heard.    No friction rub. No gallop.  Pulmonary:     Effort: Pulmonary effort is normal. No respiratory distress.     Breath sounds: Normal breath sounds. No stridor. No wheezing, rhonchi or rales.  Chest:     Chest wall: No tenderness.  Abdominal:     General: Bowel sounds are normal. There is no distension.     Palpations: Abdomen is soft. There is no mass.     Tenderness: There is no abdominal tenderness. There is no right CVA tenderness, left CVA tenderness, guarding or rebound.     Hernia: No hernia is present.  Musculoskeletal:        General: Normal range of motion.     Cervical back: Neck supple. No muscular tenderness.     Right lower leg: No edema.     Left lower leg: No edema.  Skin:    General: Skin is warm.     Coloration: Skin is not jaundiced or pale.     Findings: No bruising, erythema, lesion or rash.  Neurological:     General: No focal deficit present.     Mental Status: He is alert and oriented to person, place, and time.     Cranial Nerves: No cranial nerve deficit.     Sensory: No sensory deficit.     Motor: No weakness.     Coordination: Coordination normal.     Gait: Gait normal.     Deep Tendon Reflexes: Reflexes normal.  Psychiatric:        Mood and Affect: Mood normal.        Behavior: Behavior normal.        Thought Content: Thought  content normal.        Judgment: Judgment normal.           Assessment & Plan:  Colon cancer screening - Plan: Cologuard  Diabetes mellitus type 2 with complications (HCC) - Plan: dapagliflozin propanediol (FARXIGA) 10 MG TABS tablet, CBC with Differential/Platelet, Lipid panel, Hemoglobin A1c, COMPLETE METABOLIC PANEL WITH GFR, Protein / Creatinine Ratio, Urine  Benign essential HTN - Plan: hydrochlorothiazide (HYDRODIURIL) 25 MG tablet  Palpitations - Plan: EKG 12-Lead  Hypertension, unspecified type  Stage 3 chronic kidney disease, unspecified whether stage 3a or 3b CKD (HCC)  Encounter for Medicare annual wellness exam Spent the majority of our visit discussing the new diagnosis of atrial fibrillation.  Start the patient on Eliquis 5 mg twice a day.  Discontinue hydrochlorothiazide and replace with metoprolol XL 25 mg a day for rate control.  Continue his other medications.  He has a chronic large goiter.  I will check a TSH and if signs of hyperthyroidism, we will repeat an ultrasound to rule out hot nodule that may have precipitated atrial fibrillation.  Last ultrasound was in 2021 and showed a diffuse goiter but no nodularity.  Recommended Shingrix but the patient declined.  Schedule Cologuard.  Consult cardiology.  Defer PSA to urology

## 2023-02-03 LAB — LIPID PANEL
Cholesterol: 145 mg/dL (ref <200)
HDL: 41 mg/dL (ref >=40)
LDL Cholesterol (Calc): 82 mg/dL (calc)
Non-HDL Cholesterol (Calc): 104 mg/dL (calc) (ref <130)
Total CHOL/HDL Ratio: 3.5 (calc) (ref <5.0)
Triglycerides: 120 mg/dL (ref <150)

## 2023-02-03 LAB — CBC WITH DIFFERENTIAL/PLATELET
Absolute Monocytes: 593 cells/uL (ref 200–950)
Basophils Absolute: 20 cells/uL (ref 0–200)
Basophils Relative: 0.4 %
Eosinophils Absolute: 78 cells/uL (ref 15–500)
Eosinophils Relative: 1.6 %
HCT: 43.3 % (ref 38.5–50.0)
Hemoglobin: 14.2 g/dL (ref 13.2–17.1)
Lymphs Abs: 1240 cells/uL (ref 850–3900)
MCH: 28 pg (ref 27.0–33.0)
MCHC: 32.8 g/dL (ref 32.0–36.0)
MCV: 85.2 fL (ref 80.0–100.0)
MPV: 11.1 fL (ref 7.5–12.5)
Monocytes Relative: 12.1 %
Neutro Abs: 2969 cells/uL (ref 1500–7800)
Neutrophils Relative %: 60.6 %
Platelets: 164 10*3/uL (ref 140–400)
RBC: 5.08 10*6/uL (ref 4.20–5.80)
RDW: 13.8 % (ref 11.0–15.0)
Total Lymphocyte: 25.3 %
WBC: 4.9 10*3/uL (ref 3.8–10.8)

## 2023-02-03 LAB — COMPLETE METABOLIC PANEL WITH GFR
AG Ratio: 1.3 (calc) (ref 1.0–2.5)
ALT: 20 U/L (ref 9–46)
AST: 21 U/L (ref 10–35)
Albumin: 3.9 g/dL (ref 3.6–5.1)
Alkaline phosphatase (APISO): 75 U/L (ref 35–144)
BUN/Creatinine Ratio: 13 (calc) (ref 6–22)
BUN: 23 mg/dL (ref 7–25)
CO2: 19 mmol/L — ABNORMAL LOW (ref 20–32)
Calcium: 9.3 mg/dL (ref 8.6–10.3)
Chloride: 113 mmol/L — ABNORMAL HIGH (ref 98–110)
Creat: 1.79 mg/dL — ABNORMAL HIGH (ref 0.70–1.28)
Globulin: 2.9 g/dL (calc) (ref 1.9–3.7)
Glucose, Bld: 89 mg/dL (ref 65–99)
Potassium: 4.3 mmol/L (ref 3.5–5.3)
Sodium: 145 mmol/L (ref 135–146)
Total Bilirubin: 1 mg/dL (ref 0.2–1.2)
Total Protein: 6.8 g/dL (ref 6.1–8.1)
eGFR: 38 mL/min/{1.73_m2} — ABNORMAL LOW (ref >=60)

## 2023-02-03 LAB — HEMOGLOBIN A1C
Hgb A1c MFr Bld: 6.6 % of total Hgb — ABNORMAL HIGH (ref <5.7)
Mean Plasma Glucose: 143 mg/dL
eAG (mmol/L): 7.9 mmol/L

## 2023-02-03 LAB — PROTEIN / CREATININE RATIO, URINE
Creatinine, Urine: 42 mg/dL (ref 20–320)
Protein/Creat Ratio: 5286 mg/g creat — ABNORMAL HIGH (ref 25–148)
Protein/Creatinine Ratio: 5.286 mg/mg creat — ABNORMAL HIGH (ref 0.025–0.148)
Total Protein, Urine: 222 mg/dL — ABNORMAL HIGH (ref 5–25)

## 2023-02-03 LAB — TSH: TSH: 1.18 mIU/L (ref 0.40–4.50)

## 2023-02-05 ENCOUNTER — Telehealth: Payer: Self-pay

## 2023-02-05 ENCOUNTER — Other Ambulatory Visit: Payer: Self-pay | Admitting: Family Medicine

## 2023-02-05 MED ORDER — WARFARIN SODIUM 5 MG PO TABS: 5.0000 mg | ORAL_TABLET | Freq: Every day | ORAL | 3 refills | Status: AC

## 2023-02-05 NOTE — Telephone Encounter (Signed)
Pt's daughter called in stating that pcp started pt on this med apixaban (ELIQUIS) 5 MG TABS tablet [[id number]]. Pt's daughter stated that this med is too expensive and would like to know if there is another med that pt could try. Please advise.  Cb#: Hoy Lyday (daughter) [phone]

## 2023-02-05 NOTE — Telephone Encounter (Signed)
Spoke w/daughter, pt's med has been called into pharmacy, and INR check schedule to be a wk from today, 5/20 at 830. Daughter voiced understanding.

## 2023-02-05 NOTE — Telephone Encounter (Signed)
Attempted to call pt's daughter back, no answer. LVM

## 2023-02-05 NOTE — Telephone Encounter (Signed)
Pt's daughter, Patsy Lager, called per pt would rather do the coumadin

## 2023-02-08 ENCOUNTER — Other Ambulatory Visit: Payer: Self-pay | Admitting: Family Medicine

## 2023-02-08 DIAGNOSIS — E118 Type 2 diabetes mellitus with unspecified complications: Secondary | ICD-10-CM

## 2023-02-08 DIAGNOSIS — N183 Chronic kidney disease, stage 3 unspecified: Secondary | ICD-10-CM

## 2023-02-12 ENCOUNTER — Other Ambulatory Visit: Payer: Medicare Other

## 2023-02-12 DIAGNOSIS — I4891 Unspecified atrial fibrillation: Secondary | ICD-10-CM | POA: Diagnosis not present

## 2023-02-12 LAB — PT WITH INR/FINGERSTICK
INR, fingerstick: 1.7 ratio — ABNORMAL HIGH
PT, fingerstick: 19.9 s — ABNORMAL HIGH (ref 10.5–13.1)

## 2023-02-13 ENCOUNTER — Telehealth: Payer: Self-pay

## 2023-02-13 NOTE — Telephone Encounter (Signed)
Pt's daughter called in to ask if the referral for the Nephrologist for pt could be sent to an office in [place]. Pt's daughter stated that the referral was sent to an office in [place], but pt would like to be seen in [place]. Please advise  CB#: [phone]

## 2023-03-02 ENCOUNTER — Other Ambulatory Visit: Payer: Self-pay | Admitting: Family Medicine

## 2023-03-02 NOTE — Telephone Encounter (Signed)
Last OV 02/02/23 within protocol.  Requested Prescriptions  Pending Prescriptions Disp Refills   atorvastatin (LIPITOR) 40 MG tablet [Pharmacy Med Name: Atorvastatin Calcium 40 MG Oral Tablet] 90 tablet 0    Sig: Take 1 tablet by mouth once daily     Cardiovascular:  Antilipid - Statins Failed - 03/02/2023  8:07 AM      Failed - Valid encounter within last 12 months    Recent Outpatient Visits           1 year ago Diabetes mellitus type 2 with complications (HCC)   [facility] Pickard, Priscille Heidelberg, MD   2 years ago Left hand pain   [facility] Tanya Nones, Priscille Heidelberg, MD   2 years ago Left hand pain   [facility] Tanya Nones, Priscille Heidelberg, MD   2 years ago Goiter   [facility] Medicine Tanya Nones, Priscille Heidelberg, MD   2 years ago Benign essential HTN   [facility] Pickard, Priscille Heidelberg, MD       Future Appointments             In 1 month Anne Fu, Veverly Fells, MD [facility], [facility]            Failed - Lipid Panel in normal range within the last 12 months    Cholesterol  Date Value Ref Range Status  02/02/2023 145 <200 mg/dL Final   LDL Cholesterol (Calc)  Date Value Ref Range Status  02/02/2023 82 mg/dL (calc) Final    Comment:    Reference range: <100 . Desirable range <100 mg/dL for primary prevention;   <70 mg/dL for patients with CHD or diabetic patients  with > or = 2 CHD risk factors. Marland Kitchen LDL-C is now calculated using the Martin-Hopkins  calculation, which is a validated novel method providing  better accuracy than the Friedewald equation in the  estimation of LDL-C.  Horald Pollen et al. Lenox Ahr. 6962;952(84): 2061-2068  (http://education.[facility].com/faq/FAQ164)    HDL  Date Value Ref Range Status  02/02/2023 41 > OR = 40 mg/dL Final   Triglycerides  Date Value Ref Range Status  02/02/2023 120 <150 mg/dL Final         Passed - Patient is not pregnant

## 2023-03-11 ENCOUNTER — Other Ambulatory Visit: Payer: Self-pay | Admitting: Family Medicine

## 2023-03-13 ENCOUNTER — Other Ambulatory Visit: Payer: Self-pay | Admitting: Nephrology

## 2023-03-13 DIAGNOSIS — E785 Hyperlipidemia, unspecified: Secondary | ICD-10-CM

## 2023-03-13 DIAGNOSIS — N2581 Secondary hyperparathyroidism of renal origin: Secondary | ICD-10-CM | POA: Diagnosis not present

## 2023-03-13 DIAGNOSIS — R809 Proteinuria, unspecified: Secondary | ICD-10-CM | POA: Diagnosis not present

## 2023-03-13 DIAGNOSIS — I129 Hypertensive chronic kidney disease with stage 1 through stage 4 chronic kidney disease, or unspecified chronic kidney disease: Secondary | ICD-10-CM | POA: Diagnosis not present

## 2023-03-13 DIAGNOSIS — N1832 Chronic kidney disease, stage 3b: Secondary | ICD-10-CM | POA: Diagnosis not present

## 2023-03-13 DIAGNOSIS — D631 Anemia in chronic kidney disease: Secondary | ICD-10-CM | POA: Diagnosis not present

## 2023-03-14 NOTE — Progress Notes (Deleted)
Subjective:   Travis Singh is a 77 y.o. male who presents for Medicare Annual/Subsequent preventive examination.  Visit Complete: {VISITMETHOD:[phone]}  Patient Medicare AWV questionnaire was completed by the patient on ***; I have confirmed that all information answered by patient is correct and no changes since this date.  Review of Systems    ***       Objective:    There were no vitals filed for this visit. There is no height or weight on file to calculate BMI.     03/09/2022    8:35 AM 02/24/2021    9:52 AM 02/24/2021    9:24 AM 12/10/2020    1:09 PM 06/20/2018    6:38 AM  Advanced Directives  Does Patient Have a Medical Advance Directive? Yes Yes Yes No No  Type of Estate agent of [place];Living will Healthcare Power of [place];Living will Healthcare Power of [place];Living will    Does patient want to make changes to medical advance directive?  No - Patient declined No - Patient declined    Copy of Healthcare Power of Attorney in Chart? No - copy requested No - copy requested     Would patient like information on creating a medical advance directive?    No - Patient declined No - Patient declined    Current Medications (verified) Outpatient Encounter Medications as of 03/15/2023  Medication Sig   allopurinol (ZYLOPRIM) 300 MG tablet Take 1 tablet by mouth once daily (Patient not taking: Reported on 02/02/2023)   amLODipine (NORVASC) 5 MG tablet Take 1 tablet (5 mg total) by mouth daily.   atorvastatin (LIPITOR) 40 MG tablet Take 1 tablet by mouth once daily   colchicine 0.6 MG tablet Take 1 tablet (0.6 mg total) by mouth daily.   dapagliflozin propanediol (FARXIGA) 10 MG TABS tablet Take 1 tablet (10 mg total) by mouth daily before breakfast.   lisinopril (ZESTRIL) 40 MG tablet Take 1 tablet by mouth once daily   metoprolol succinate (TOPROL-XL) 25 MG 24 hr tablet Take 1 tablet (25 mg total) by mouth daily.   warfarin (COUMADIN) 5 MG tablet  Take 1 tablet (5 mg total) by mouth daily. As directed   No facility-administered encounter medications on file as of 03/15/2023.    Allergies (verified) Patient has no known allergies.   History: Past Medical History:  Diagnosis Date   Bulging lumbar disc    multiple levels   Diabetes mellitus type 2 with complications [facility])    ED (erectile dysfunction)    Gout    History of adenomatous polyp of colon    Hyperlipidemia    Hypertension    Left carotid artery stenosis <50%   Left sciatic nerve pain    Lumbar stenosis    Nocturia    Prostate cancer [facility]) urologist-  dr wrenn/ oncologist-  dr Kathrynn Running   dx 01-30-2017 w/ Gleason 3+3, PSA 4.59, active survillance/  biopsy 02-06-2018 , Stage T1c, Gleason 3+4, PSA 7.08-- scheduled for radioactive seed implants   Thyroid mass    see [place] 8/20   Wears glasses    Past Surgical History:  Procedure Laterality Date   CYSTOSCOPY  06/20/2018   Procedure: CYSTOSCOPY FLEXIBLE;  Surgeon: Bjorn Pippin, MD;  Location: [facility];  Service: Urology;;  NO SEEDS FOUND IN BLADDER   NO PAST SURGERIES     PROSTATE BIOPSY  01-30-2017;  02-06-2018  dr Annabell Howells office   RADIOACTIVE SEED IMPLANT N/A 06/20/2018   Procedure: RADIOACTIVE  SEED IMPLANT/BRACHYTHERAPY IMPLANT;  Surgeon: Bjorn Pippin, MD;  Location: [facility];  Service: Urology;  Laterality: N/A;   86   SEEDS IMPLANTED   SPACE OAR INSTILLATION N/A 06/20/2018   Procedure: SPACE OAR INSTILLATION;  Surgeon: Bjorn Pippin, MD;  Location: [facility];  Service: Urology;  Laterality: N/A;   Family History  Problem Relation Age of Onset   Pancreatic cancer Mother    Leukemia Father    Lung cancer Brother    Lung cancer Brother    Lung cancer Brother    Social History   Socioeconomic History   Marital status: Married    Spouse name: Not on file   Number of children: Not on file   Years of education: Not on file   Highest education level: Not on file   Occupational History   Occupation: Corporate investment banker  Tobacco Use   Smoking status: Some Days    Types: Cigars   Smokeless tobacco: Never   Tobacco comments:    06-13-2018  per pt every once in a while , cigar  Vaping Use   Vaping Use: Never used  Substance and Sexual Activity   Alcohol use: Not Currently    Alcohol/week: 7.0 standard drinks of alcohol    Types: 7 Cans of beer per week   Drug use: No   Sexual activity: Not Currently  Other Topics Concern   Not on file  Social History Narrative   Married with one daughter and three sons. Works in Holiday representative.    Social Determinants of Health   Financial Resource Strain: Low Risk  (03/09/2022)   Overall Financial Resource Strain (CARDIA)    Difficulty of Paying Living Expenses: Not hard at all  Food Insecurity: No Food Insecurity (03/09/2022)   Hunger Vital Sign    Worried About Running Out of Food in the Last Year: Never true    Ran Out of Food in the Last Year: Never true  Transportation Needs: No Transportation Needs (03/09/2022)   PRAPARE - Administrator, Civil Service (Medical): No    Lack of Transportation (Non-Medical): No  Physical Activity: Sufficiently Active (03/09/2022)   Exercise Vital Sign    Days of Exercise per Week: 3 days    Minutes of Exercise per Session: 60 min  Stress: No Stress Concern Present (03/09/2022)   [employer] of Occupational Health - Occupational Stress Questionnaire    Feeling of Stress : Not at all  Social Connections: Socially Integrated (03/09/2022)   Social Connection and Isolation Panel [NHANES]    Frequency of Communication with Friends and Family: More than three times a week    Frequency of Social Gatherings with Friends and Family: More than three times a week    Attends Religious Services: More than 4 times per year    Active Member of [employer] or Organizations: Yes    Attends Engineer, structural: More than 4 times per year    Marital Status: Married     Tobacco Counseling Ready to quit: Not Answered Counseling given: Not Answered Tobacco comments: 06-13-2018  per pt every once in a while , cigar   Clinical Intake:                        Activities of Daily Living     No data to display          Patient Care Team: Donita Brooks, MD as PCP - General (  Family Medicine) Earlene Plater, Rita Ohara, [facility] (Inactive) as Pharmacist (Pharmacist)  Indicate any recent Medical Services you may have received from other than [facility] providers in the past year (date may be approximate).     Assessment:   This is a routine wellness examination for [place].  Hearing/Vision screen No results found.  Dietary issues and exercise activities discussed:     Goals Addressed   None    Depression Screen    02/02/2023    8:50 AM 03/09/2022    8:31 AM 05/15/2019   10:31 AM 03/13/2018    7:38 AM 11/14/2016    8:51 AM 01/21/2016    8:03 AM  PHQ 2/9 Scores  PHQ - 2 Score 3 0 0 0 0 0  PHQ- 9 Score 8     0    Fall Risk    02/02/2023    8:49 AM 03/09/2022    8:36 AM 02/24/2021    9:29 AM 05/15/2019   10:34 AM 04/25/2018    4:06 PM  Fall Risk   Falls in the past year? 0 0 0 0 No  Comment     Emmi Telephone Survey: data to providers prior to load  Number falls in past yr: 0 0 0    Injury with Fall? 0 0 0    Risk for fall due to : No Fall Risks No Fall Risks No Fall Risks    Follow up Education provided;Falls evaluation completed Falls prevention discussed Falls prevention discussed;Falls evaluation completed Falls evaluation completed     MEDICARE RISK AT HOME:   TIMED UP AND GO:  Was the test performed?  {AMBTIMEDUPGO:[phone]}    Cognitive Function:        03/09/2022    8:37 AM  6CIT Screen  What Year? 0 points  What month? 0 points  What time? 0 points  Count back from 20 0 points  Months in reverse 0 points  Repeat phrase 2 points  Total Score 2 points    Immunizations Immunization History  Administered  Date(s) Administered   Fluad Quad(high Dose 65+) 05/15/2019, 07/08/2020, 07/03/2022   Influenza, High Dose Seasonal PF 07/04/2017, 08/01/2018   PFIZER(Purple Top)SARS-COV-2 Vaccination 08/02/2020   Pneumococcal Conjugate-13 02/12/2015   Pneumococcal Polysaccharide-23 01/20/2011    TDAP status: Due, Education has been provided regarding the importance of this vaccine. Advised may receive this vaccine at local pharmacy or Health Dept. Aware to provide a copy of the vaccination record if obtained from local pharmacy or Health Dept. Verbalized acceptance and understanding.  Pneumococcal vaccine status: Up to date  Covid-19 vaccine status: Information provided on how to obtain vaccines.   Qualifies for Shingles Vaccine? Yes   Zostavax completed No   Shingrix Completed?: No.    Education has been provided regarding the importance of this vaccine. Patient has been advised to call insurance company to determine out of pocket expense if they have not yet received this vaccine. Advised may also receive vaccine at local pharmacy or Health Dept. Verbalized acceptance and understanding.  Screening Tests Health Maintenance  Topic Date Due   OPHTHALMOLOGY EXAM  Never done   Hepatitis C Screening  Never done   DTaP/Tdap/Td (1 - Tdap) Never done   Zoster Vaccines- Shingrix (1 of 2) Never done   FOOT EXAM  10/13/2016   COVID-19 Vaccine (5 - 2023-24 season) 05/26/2022   INFLUENZA VACCINE  04/26/2023   HEMOGLOBIN A1C  08/05/2023   Diabetic kidney evaluation - eGFR measurement  02/02/2024  Diabetic kidney evaluation - Urine ACR  02/02/2024   Medicare Annual Wellness (AWV)  02/02/2024   Pneumonia Vaccine 82+ Years old  Completed   HPV VACCINES  Aged Out   Colonoscopy  Discontinued    Health Maintenance  Health Maintenance Due  Topic Date Due   OPHTHALMOLOGY EXAM  Never done   Hepatitis C Screening  Never done   DTaP/Tdap/Td (1 - Tdap) Never done   Zoster Vaccines- Shingrix (1 of 2) Never done    FOOT EXAM  10/13/2016   COVID-19 Vaccine (5 - 2023-24 season) 05/26/2022    Colorectal cancer screening:  Cologuard ordered 02/02/23  Lung Cancer Screening: (Low Dose CT Chest recommended if Age 79-80 years, 20 pack-year currently smoking OR have quit w/in 15years.) does not qualify.   Lung Cancer Screening Referral: n/a  Additional Screening:  Hepatitis C Screening:  Does qualify; Completed ***  Vision Screening: Recommended annual ophthalmology exams for early detection of glaucoma and other disorders of the eye. Is the patient up to date with their annual eye exam?  {YES/NO:21197} Who is the provider or what is the name of the office in which the patient attends annual eye exams? *** If pt is not established with a provider, would they like to be referred to a provider to establish care? {YES/NO:21197}.   Dental Screening: Recommended annual dental exams for proper oral hygiene  Diabetic Foot Exam: {Diabetic Foot Exam:[id number]}  Community Resource Referral / Chronic Care Management: CRR required this visit?  {YES/NO:21197}  CCM required this visit?  {CCM Required choices:[phone]}     Plan:     I have personally reviewed and noted the following in the patient's chart:   Medical and social history Use of alcohol, tobacco or illicit drugs  Current medications and supplements including opioid prescriptions. {Opioid Prescriptions:[phone]} Functional ability and status Nutritional status Physical activity Advanced directives List of other physicians Hospitalizations, surgeries, and ER visits in previous 12 months Vitals Screenings to include cognitive, depression, and falls Referrals and appointments  In addition, I have reviewed and discussed with patient certain preventive protocols, quality metrics, and best practice recommendations. A written personalized care plan for preventive services as well as general preventive health recommendations were provided to  patient.     Kandis Fantasia [place], [place]   1/61/0960   After Visit Summary: {CHL AMB AWV After Visit [phone]}  Nurse Notes: ***

## 2023-03-15 ENCOUNTER — Telehealth: Payer: Self-pay | Admitting: Family Medicine

## 2023-03-15 NOTE — Telephone Encounter (Signed)
Patient came to the office to request samples of FARXIGA 10MG .   As per nurse, four (4) boxes dispensed.

## 2023-03-16 ENCOUNTER — Ambulatory Visit
Admission: RE | Admit: 2023-03-16 | Discharge: 2023-03-16 | Disposition: A | Discharge status: Home / Self Care | Payer: Medicare Other | Source: Ambulatory Visit | Attending: Nephrology | Admitting: Nephrology

## 2023-03-16 DIAGNOSIS — N1832 Chronic kidney disease, stage 3b: Secondary | ICD-10-CM

## 2023-03-16 DIAGNOSIS — N189 Chronic kidney disease, unspecified: Secondary | ICD-10-CM | POA: Diagnosis not present

## 2023-03-16 DIAGNOSIS — E785 Hyperlipidemia, unspecified: Secondary | ICD-10-CM

## 2023-03-16 DIAGNOSIS — R809 Proteinuria, unspecified: Secondary | ICD-10-CM

## 2023-03-16 DIAGNOSIS — N2581 Secondary hyperparathyroidism of renal origin: Secondary | ICD-10-CM

## 2023-03-16 DIAGNOSIS — D631 Anemia in chronic kidney disease: Secondary | ICD-10-CM

## 2023-03-27 ENCOUNTER — Other Ambulatory Visit (HOSPITAL_COMMUNITY): Payer: Self-pay | Admitting: Nephrology

## 2023-03-27 DIAGNOSIS — R809 Proteinuria, unspecified: Secondary | ICD-10-CM

## 2023-04-02 ENCOUNTER — Other Ambulatory Visit: Payer: Self-pay | Admitting: Family Medicine

## 2023-04-02 DIAGNOSIS — I10 Essential (primary) hypertension: Secondary | ICD-10-CM

## 2023-04-02 NOTE — Telephone Encounter (Signed)
Requested Prescriptions  Pending Prescriptions Disp Refills   amLODipine (NORVASC) 5 MG tablet [Pharmacy Med Name: amLODIPine Besylate 5 MG Oral Tablet] 90 tablet 0    Sig: Take 1 tablet by mouth once daily     Cardiovascular: Calcium Channel Blockers 2 Failed - 04/02/2023  9:34 AM      Failed - Last BP in normal range    BP Readings from Last 1 Encounters:  02/02/23 (!) 160/82         Failed - Valid encounter within last 6 months    Recent Outpatient Visits           1 year ago Diabetes mellitus type 2 with complications (HCC)   [facility] Pickard, Priscille Heidelberg, MD   2 years ago Left hand pain   [facility] Tanya Nones, Priscille Heidelberg, MD   2 years ago Left hand pain   [facility] Tanya Nones, Priscille Heidelberg, MD   2 years ago Goiter   [facility] Medicine Tanya Nones, Priscille Heidelberg, MD   2 years ago Benign essential HTN   [facility] Pickard, Priscille Heidelberg, MD       Future Appointments             In 2 days Jake Bathe, MD [facility] at [facility], [facility]            Passed - Last Heart Rate in normal range    Pulse Readings from Last 1 Encounters:  02/02/23 93

## 2023-04-03 NOTE — Progress Notes (Signed)
Per note from Dr Lynnea Ferrier: Yes, I spoke with the nephrologist last week. He is to hold coumadin starting 7/18 (5 days before procedure) and check INR on 7/21 to ensure less than 1.5. It should be documented in his chart. My nurse has been in contact with the patient and he is aware.Travis Singh

## 2023-04-04 ENCOUNTER — Ambulatory Visit: Payer: Medicare Other | Attending: Cardiology | Admitting: Cardiology

## 2023-04-04 ENCOUNTER — Encounter: Payer: Self-pay | Admitting: Cardiology

## 2023-04-04 VITALS — BP 124/78 | HR 62 | Ht 68.0 in | Wt 196.2 lb

## 2023-04-04 DIAGNOSIS — I10 Essential (primary) hypertension: Secondary | ICD-10-CM | POA: Insufficient documentation

## 2023-04-04 DIAGNOSIS — I6522 Occlusion and stenosis of left carotid artery: Secondary | ICD-10-CM | POA: Insufficient documentation

## 2023-04-04 DIAGNOSIS — I480 Paroxysmal atrial fibrillation: Secondary | ICD-10-CM | POA: Insufficient documentation

## 2023-04-04 NOTE — Patient Instructions (Signed)
Medication Instructions:  The current medical regimen is effective;  continue present plan and medications.  *If you need a refill on your cardiac medications before your next appointment, please call your pharmacy*  Testing/Procedures: Your physician has requested that you have an echocardiogram. Echocardiography is a painless test that uses sound waves to create images of your heart. It provides your doctor with information about the size and shape of your heart and how well your heart's chambers and valves are working. This procedure takes approximately one hour. There are no restrictions for this procedure. Please do NOT wear cologne, perfume, aftershave, or lotions (deodorant is allowed). Please arrive 15 minutes prior to your appointment time.  Follow-Up: At [facility], you and your health needs are our priority.  As part of our continuing mission to provide you with exceptional heart care, we have created designated Provider Care Teams.  These Care Teams include your primary Cardiologist (physician) and Advanced Practice Providers (APPs -  Physician Assistants and Nurse Practitioners) who all work together to provide you with the care you need, when you need it.  We recommend signing up for the patient portal called "MyChart".  Sign up information is provided on this After Visit Summary.  MyChart is used to connect with patients for Virtual Visits (Telemedicine).  Patients are able to view lab/test results, encounter notes, upcoming appointments, etc.  Non-urgent messages can be sent to your provider as well.   To learn more about what you can do with MyChart, go to [email or web address].    Your next appointment:   6 month(s)  Provider:   Robin Searing, NP, Jacolyn Reedy, PA-C, Eligha Bridegroom, NP, Tereso Newcomer, PA-C, or Perlie Gold, PA-C

## 2023-04-04 NOTE — Progress Notes (Signed)
Cardiology Office Note:    Date:  04/04/2023   ID:  Travis Singh, DOB 05/07/1946, MRN [id number]  PCP:  Donita Brooks, MD   [facility] Providers Cardiologist:  None     Referring MD: Donita Brooks, MD    History of Present Illness:    Travis Singh is a 77 y.o. male here for evaluation of paroxysmal atrial fibrillation.  Prior EKG from 02/02/2023 reviewed that showed atrial fibrillation/flutter rate controlled.  Current EKG shows sinus rhythm with PVCs.  Feels well.  No symptoms.  No chest pain.  No shortness of breath.  Here with his wife who is a patient of Dr. Flossie Buffy.  No early CAD Remote smoker.  No recent ETOH.   +DM +HTN No CVA  Past Medical History:  Diagnosis Date   Bulging lumbar disc    multiple levels   Diabetes mellitus type 2 with complications [facility])    ED (erectile dysfunction)    Gout    History of adenomatous polyp of colon    Hyperlipidemia    Hypertension    Left carotid artery stenosis <50%   Left sciatic nerve pain    Lumbar stenosis    Nocturia    Prostate cancer [facility]) urologist-  dr wrenn/ oncologist-  dr Kathrynn Running   dx 01-30-2017 w/ Gleason 3+3, PSA 4.59, active survillance/  biopsy 02-06-2018 , Stage T1c, Gleason 3+4, PSA 7.08-- scheduled for radioactive seed implants   Thyroid mass    see [place] 8/20   Wears glasses     Past Surgical History:  Procedure Laterality Date   CYSTOSCOPY  06/20/2018   Procedure: CYSTOSCOPY FLEXIBLE;  Surgeon: Bjorn Pippin, MD;  Location: [facility];  Service: Urology;;  NO SEEDS FOUND IN BLADDER   NO PAST SURGERIES     PROSTATE BIOPSY  01-30-2017;  02-06-2018  dr Annabell Howells office   RADIOACTIVE SEED IMPLANT N/A 06/20/2018   Procedure: RADIOACTIVE SEED IMPLANT/BRACHYTHERAPY IMPLANT;  Surgeon: Bjorn Pippin, MD;  Location: [facility];  Service: Urology;  Laterality: N/A;   86   SEEDS IMPLANTED   SPACE OAR INSTILLATION N/A 06/20/2018   Procedure: SPACE OAR INSTILLATION;   Surgeon: Bjorn Pippin, MD;  Location: [facility];  Service: Urology;  Laterality: N/A;    Current Medications: No outpatient medications have been marked as taking for the 04/04/23 encounter (Office Visit) with Jake Bathe, MD.     Allergies:   Patient has no known allergies.   Social History   Socioeconomic History   Marital status: Married    Spouse name: Not on file   Number of children: Not on file   Years of education: Not on file   Highest education level: Not on file  Occupational History   Occupation: Corporate investment banker  Tobacco Use   Smoking status: Some Days    Types: Cigars   Smokeless tobacco: Never   Tobacco comments:    06-13-2018  per pt every once in a while , cigar  Vaping Use   Vaping Use: Never used  Substance and Sexual Activity   Alcohol use: Not Currently    Alcohol/week: 7.0 standard drinks of alcohol    Types: 7 Cans of beer per week   Drug use: No   Sexual activity: Not Currently  Other Topics Concern   Not on file  Social History Narrative   Married with one daughter and three sons. Works in Holiday representative.    Social Determinants of  Health   Financial Resource Strain: Low Risk  (03/09/2022)   Overall Financial Resource Strain (CARDIA)    Difficulty of Paying Living Expenses: Not hard at all  Food Insecurity: No Food Insecurity (03/09/2022)   Hunger Vital Sign    Worried About Running Out of Food in the Last Year: Never true    Ran Out of Food in the Last Year: Never true  Transportation Needs: No Transportation Needs (03/09/2022)   PRAPARE - Administrator, Civil Service (Medical): No    Lack of Transportation (Non-Medical): No  Physical Activity: Sufficiently Active (03/09/2022)   Exercise Vital Sign    Days of Exercise per Week: 3 days    Minutes of Exercise per Session: 60 min  Stress: No Stress Concern Present (03/09/2022)   [employer] of Occupational Health - Occupational Stress Questionnaire     Feeling of Stress : Not at all  Social Connections: Socially Integrated (03/09/2022)   Social Connection and Isolation Panel [NHANES]    Frequency of Communication with Friends and Family: More than three times a week    Frequency of Social Gatherings with Friends and Family: More than three times a week    Attends Religious Services: More than 4 times per year    Active Member of [employer] or Organizations: Yes    Attends Engineer, structural: More than 4 times per year    Marital Status: Married     Family History: The patient's family history includes Leukemia in his father; Lung cancer in his brother, brother, and brother; Pancreatic cancer in his mother.  ROS:   Please see the history of present illness.     All other systems reviewed and are negative.  EKGs/Labs/Other Studies Reviewed:    The following studies were reviewed today: Prior office notes lab work reviewed  EKG:  EKG Interpretation Date/Time:  Wednesday April 04 2023 13:00:39 EDT Ventricular Rate:  67 PR Interval:  224 QRS Duration:  100 QT Interval:  430 QTC Calculation: 454 R Axis:   57  Text Interpretation: Sinus rhythm with 1st degree A-V block with frequent Premature ventricular complexes When compared with ECG of 16-May-2018 10:17, Premature ventricular complexes are now Present Confirmed by Donato Schultz ([id number]) on 04/04/2023 1:12:31 PM    Recent Labs: 02/02/2023: ALT 20; BUN 23; Creat 1.79; Hemoglobin 14.2; Platelets 164; Potassium 4.3; Sodium 145; TSH 1.18  Recent Lipid Panel    Component Value Date/Time   CHOL 145 02/02/2023 0920   TRIG 120 02/02/2023 0920   HDL 41 02/02/2023 0920   CHOLHDL 3.5 02/02/2023 0920   VLDL 27 11/14/2016 0857   LDLCALC 82 02/02/2023 0920     Risk Assessment/Calculations:    CHA2DS2-VASc Score = 4   This indicates a 4.8% annual risk of stroke. The patient's score is based upon: CHF History: 0 HTN History: 1 Diabetes History: 1 Stroke History: 0 Vascular  Disease History: 0 Age Score: 2 Gender Score: 0               Physical Exam:    VS:  BP 124/78   Pulse 62   Ht 5\' 8"  (1.727 m)   Wt 196 lb 3.2 oz (89 kg)   SpO2 95%   BMI 29.83 kg/m     Wt Readings from Last 3 Encounters:  04/04/23 196 lb 3.2 oz (89 kg)  02/02/23 195 lb (88.5 kg)  03/09/22 200 lb 9.6 oz (91 kg)     GEN: Well nourished,  well developed in no acute distress HEENT: Normal NECK: No JVD; No carotid bruits LYMPHATICS: No lymphadenopathy CARDIAC: RRR, no murmurs, rubs, gallops RESPIRATORY:  Clear to auscultation without rales, wheezing or rhonchi  ABDOMEN: Soft, non-tender, non-distended MUSCULOSKELETAL:  No edema; No deformity  SKIN: Warm and dry NEUROLOGIC:  Alert and oriented x 3 PSYCHIATRIC:  Normal affect   ASSESSMENT:    1. Paroxysmal atrial fibrillation (HCC)   2. Hypertension, unspecified type   3. Left carotid artery stenosis    PLAN:    In order of problems listed above:  Paroxysmal atrial fibrillation - Agree with anticoagulation currently on warfarin.  May wish to switch him over to Eliquis especially if he can get the [place] benefits.  Ease of use.  Watch for any signs of bleeding. -Check echocardiogram. -Okay with Toprol-XL 25 mg a day  Chronic anticoagulation - Currently on warfarin.  Consider Eliquis.  Renal biopsy - Coming up on the 22nd.  Has hold parameters.  Excellent.  Diabetes - Marcelline Deist was $300 at the drugstore.  He is now getting samples.  Perhaps [place] benefits will help him. -Continue with lisinopril 40 mg atorvastatin 40 mg amlodipine 5 mg Farxiga 10 mg if able to get.  Left carotid artery stenosis - Continue with statin.         Medication Adjustments/Labs and Tests Ordered: Current medicines are reviewed at length with the patient today.  Concerns regarding medicines are outlined above.  Orders Placed This Encounter  Procedures   EKG 12-Lead   ECHOCARDIOGRAM COMPLETE   No orders of the defined types were  placed in this encounter.   Patient Instructions  Medication Instructions:  The current medical regimen is effective;  continue present plan and medications.  *If you need a refill on your cardiac medications before your next appointment, please call your pharmacy*  Testing/Procedures: Your physician has requested that you have an echocardiogram. Echocardiography is a painless test that uses sound waves to create images of your heart. It provides your doctor with information about the size and shape of your heart and how well your heart's chambers and valves are working. This procedure takes approximately one hour. There are no restrictions for this procedure. Please do NOT wear cologne, perfume, aftershave, or lotions (deodorant is allowed). Please arrive 15 minutes prior to your appointment time.  Follow-Up: At [facility], you and your health needs are our priority.  As part of our continuing mission to provide you with exceptional heart care, we have created designated Provider Care Teams.  These Care Teams include your primary Cardiologist (physician) and Advanced Practice Providers (APPs -  Physician Assistants and Nurse Practitioners) who all work together to provide you with the care you need, when you need it.  We recommend signing up for the patient portal called "MyChart".  Sign up information is provided on this After Visit Summary.  MyChart is used to connect with patients for Virtual Visits (Telemedicine).  Patients are able to view lab/test results, encounter notes, upcoming appointments, etc.  Non-urgent messages can be sent to your provider as well.   To learn more about what you can do with MyChart, go to [email or web address].    Your next appointment:   6 month(s)  Provider:   Robin Searing, NP, Jacolyn Reedy, PA-C, Eligha Bridegroom, NP, Tereso Newcomer, PA-C, or Perlie Gold, PA-C            Signed, Donato Schultz, MD  04/04/2023 2:00 PM    [place]  [facility]

## 2023-04-11 ENCOUNTER — Other Ambulatory Visit: Payer: Self-pay

## 2023-04-11 DIAGNOSIS — E118 Type 2 diabetes mellitus with unspecified complications: Secondary | ICD-10-CM

## 2023-04-11 MED ORDER — DAPAGLIFLOZIN PROPANEDIOL 10 MG PO TABS
10.0000 mg | ORAL_TABLET | Freq: Every day | ORAL | 1 refills | Status: DC
Start: 2023-04-11 — End: 2024-05-27

## 2023-04-12 ENCOUNTER — Other Ambulatory Visit: Payer: Medicare Other

## 2023-04-13 ENCOUNTER — Other Ambulatory Visit (HOSPITAL_COMMUNITY): Payer: Self-pay | Admitting: Student

## 2023-04-13 DIAGNOSIS — R809 Proteinuria, unspecified: Secondary | ICD-10-CM

## 2023-04-15 NOTE — Progress Notes (Signed)
Chief Complaint: Patient was seen in consultation today for proteinuria  Referring Physician(s): Singh,Vikas  Supervising Physician: Malachy Moan  Patient Status: [facility] - Out-pt  History of Present Illness: Travis Singh is a 77 y.o. male with past medical history of DM, HLD, HTN who is followed by Dr. Thedore Mins for stage III kidney disease.  Patient with persistent proteinuria.  Kidney biopsy requested.  Travis Singh presents to [facility] today for the procedure in his usual state of health.  He has been NPO.  He has held his blood thinners.  Denies fever, chills, nausea, vomiting, abdominal pain, dysuria, hematuria. He is understanding of the risks of the procedure today and is agreeable to proceed.   Past Medical History:  Diagnosis Date   Bulging lumbar disc    multiple levels   Diabetes mellitus type 2 with complications [facility])    ED (erectile dysfunction)    Gout    History of adenomatous polyp of colon    Hyperlipidemia    Hypertension    Left carotid artery stenosis <50%   Left sciatic nerve pain    Lumbar stenosis    Nocturia    Prostate cancer [facility]) urologist-  dr wrenn/ oncologist-  dr Kathrynn Running   dx 01-30-2017 w/ Gleason 3+3, PSA 4.59, active survillance/  biopsy 02-06-2018 , Stage T1c, Gleason 3+4, PSA 7.08-- scheduled for radioactive seed implants   Thyroid mass    see [place] 8/20   Wears glasses     Past Surgical History:  Procedure Laterality Date   CYSTOSCOPY  06/20/2018   Procedure: CYSTOSCOPY FLEXIBLE;  Surgeon: Bjorn Pippin, MD;  Location[facility];  Service: Urology;;  NO SEEDS FOUND IN BLADDER   NO PAST SURGERIES     PROSTATE BIOPSY  01-30-2017;  02-06-2018  dr Annabell Howells office   RADIOACTIVE SEED IMPLANT N/A 06/20/2018   Procedure: RADIOACTIVE SEED IMPLANT/BRACHYTHERAPY IMPLANT;  Surgeon: Bjorn Pippin, MD;  Location: [facility];  Service: Urology;  Laterality: N/A;   86   SEEDS IMPLANTED   SPACE OAR INSTILLATION N/A 06/20/2018    Procedure: SPACE OAR INSTILLATION;  Surgeon: Bjorn Pippin, MD;  Location: [facility];  Service: Urology;  Laterality: N/A;    Allergies: Patient has no known allergies.  Medications: Prior to Admission medications   Medication Sig Start Date End Date Taking? Authorizing Provider  allopurinol (ZYLOPRIM) 300 MG tablet Take 1 tablet by mouth once daily Patient not taking: Reported on 02/02/2023 11/23/21   Donita Brooks, MD  amLODipine (NORVASC) 5 MG tablet Take 1 tablet by mouth once daily 04/02/23   Donita Brooks, MD  atorvastatin (LIPITOR) 40 MG tablet Take 1 tablet by mouth once daily 03/02/23   Donita Brooks, MD  colchicine 0.6 MG tablet Take 1 tablet (0.6 mg total) by mouth daily. 03/12/23   Donita Brooks, MD  dapagliflozin propanediol (FARXIGA) 10 MG TABS tablet Take 1 tablet (10 mg total) by mouth daily before breakfast. 04/11/23   Donita Brooks, MD  lisinopril (ZESTRIL) 40 MG tablet Take 1 tablet by mouth once daily 05/12/22   Donita Brooks, MD  metoprolol succinate (TOPROL-XL) 25 MG 24 hr tablet Take 1 tablet (25 mg total) by mouth daily. 02/02/23   Donita Brooks, MD  warfarin (COUMADIN) 5 MG tablet Take 1 tablet (5 mg total) by mouth daily. As directed 02/05/23   Donita Brooks, MD     Family History  Problem Relation Age of  Onset   Pancreatic cancer Mother    Leukemia Father    Lung cancer Brother    Lung cancer Brother    Lung cancer Brother     Social History   Socioeconomic History   Marital status: Married    Spouse name: Not on file   Number of children: Not on file   Years of education: Not on file   Highest education level: Not on file  Occupational History   Occupation: Corporate investment banker  Tobacco Use   Smoking status: Some Days    Types: Cigars   Smokeless tobacco: Never   Tobacco comments:    06-13-2018  per pt every once in a while , cigar  Vaping Use   Vaping status: Never Used  Substance and Sexual Activity    Alcohol use: Not Currently    Alcohol/week: 7.0 standard drinks of alcohol    Types: 7 Cans of beer per week   Drug use: No   Sexual activity: Not Currently  Other Topics Concern   Not on file  Social History Narrative   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 Works in Holiday representative.    Social Determinants of Health   Financial Resource Strain: Low Risk  (03/09/2022)   Overall Financial Resource Strain (CARDIA)    Difficulty of Paying Living Expenses: Not hard at all  Food Insecurity: No Food Insecurity (03/09/2022)   Hunger Vital Sign    Worried About Running Out of Food in the Last Year: Never true    Ran Out of Food in the Last Year: Never true  Transportation Needs: No Transportation Needs (03/09/2022)   PRAPARE - Administrator, Civil Service (Medical): No    Lack of Transportation (Non-Medical): No  Physical Activity: Sufficiently Active (03/09/2022)   Exercise Vital Sign    Days of Exercise per Week: 3 days    Minutes of Exercise per Session: 60 min  Stress: No Stress Concern Present (03/09/2022)   [employer] of Occupational Health - Occupational Stress Questionnaire    Feeling of Stress : Not at all  Social Connections: Socially Integrated (03/09/2022)   Social Connection and Isolation Panel [NHANES]    Frequency of Communication with Friends and Family: More than three times a week    Frequency of Social Gatherings with Friends and Family: More than three times a week    Attends Religious Services: More than 4 times per year    Active Member of [employer] or Organizations: Yes    Attends Engineer, structural: More than 4 times per year    Marital Status: Married     Review of Systems: A 12 point ROS discussed and pertinent positives are indicated in the HPI above.  All other systems are negative.  Review of Systems  Constitutional:  Negative for fever.  Respiratory:  Negative for cough and shortness of breath.   Cardiovascular:  Negative for  chest pain.  Gastrointestinal:  Negative for abdominal pain, nausea and vomiting.  Musculoskeletal:  Negative for back pain.  Psychiatric/Behavioral:  Negative for behavioral problems and confusion.     Vital Signs: BP (!) 166/97   Pulse 73   Temp 98 F (36.7 C)   Ht 5\' 8"  (1.727 m)   Wt 200 lb (90.7 kg)   SpO2 96%   BMI 30.41 kg/m   Physical Exam Vitals and nursing note reviewed.  Constitutional:      General: He is not in acute distress.    Appearance:  Normal appearance. He is not ill-appearing.  HENT:     Mouth/Throat:     Mouth: Mucous membranes are moist.     Pharynx: Oropharynx is clear.  Cardiovascular:     Rate and Rhythm: Normal rate and regular rhythm.  Pulmonary:     Effort: Pulmonary effort is normal. No respiratory distress.     Breath sounds: Normal breath sounds.  Abdominal:     General: Abdomen is flat.     Palpations: Abdomen is soft.  Skin:    General: Skin is warm and dry.  Neurological:     General: No focal deficit present.     Mental Status: He is alert and oriented to person, place, and time. Mental status is at baseline.  Psychiatric:        Mood and Affect: Mood normal.        Behavior: Behavior normal.        Thought Content: Thought content normal.        Judgment: Judgment normal.      MD Evaluation Airway: WNL Heart: WNL Abdomen: WNL Chest/ Lungs: WNL ASA  Classification: 3 Mallampati/Airway Score: Two   Imaging: No results found.  Labs:  CBC: Recent Labs    02/02/23 0920 04/16/23 0726  WBC 4.9 4.6  HGB 14.2 14.3  HCT 43.3 45.3  PLT 164 170    COAGS: Recent Labs    02/12/23 0840  INR 1.7*    BMP: Recent Labs    02/02/23 0920  NA 145  K 4.3  CL 113*  CO2 19*  GLUCOSE 89  BUN 23  CALCIUM 9.3  CREATININE 1.79*    LIVER FUNCTION TESTS: Recent Labs    02/02/23 0920  BILITOT 1.0  AST 21  ALT 20  PROT 6.8    TUMOR MARKERS: No results for input(s): "AFPTM", "CEA", "CA199", "CHROMGRNA" in the  last 8760 hours.  Assessment and Plan: Patient with past medical history of DM, HTN presents with complaint of proteinuria.  IR consulted for kidney biopsy at the request of Dr. Thedore Mins. Case reviewed by Dr. Loreta Ave who approves patient for procedure.  Patient presents today in their usual state of health.  He has been NPO and is not currently on blood thinners.   Risks and benefits was discussed with the patient and/or patient's family including, but not limited to bleeding, infection, damage to adjacent structures or low yield requiring additional tests.  All of the questions were answered and there is agreement to proceed.  Consent signed and in chart.  His daughter is available for transport home where he lives with his wife.   Thank you for this interesting consult.  I greatly enjoyed meeting ZARIUS FURR and look forward to participating in their care.  A copy of this report was sent to the requesting provider on this date.  Electronically Signed: Hoyt Koch, PA 04/16/2023, 7:46 AM   I spent a total of  30 Minutes   in face to face in clinical consultation, greater than 50% of which was counseling/coordinating care for proteinura

## 2023-04-16 ENCOUNTER — Other Ambulatory Visit: Payer: Self-pay

## 2023-04-16 ENCOUNTER — Ambulatory Visit (HOSPITAL_COMMUNITY)
Admission: RE | Admit: 2023-04-16 | Discharge: 2023-04-16 | Disposition: A | Discharge status: Home / Self Care | Payer: Medicare Other | Source: Ambulatory Visit | Attending: Nephrology | Admitting: Nephrology

## 2023-04-16 ENCOUNTER — Encounter (HOSPITAL_COMMUNITY): Payer: Self-pay

## 2023-04-16 DIAGNOSIS — I129 Hypertensive chronic kidney disease with stage 1 through stage 4 chronic kidney disease, or unspecified chronic kidney disease: Secondary | ICD-10-CM | POA: Diagnosis not present

## 2023-04-16 DIAGNOSIS — E1122 Type 2 diabetes mellitus with diabetic chronic kidney disease: Secondary | ICD-10-CM | POA: Insufficient documentation

## 2023-04-16 DIAGNOSIS — R1011 Right upper quadrant pain: Secondary | ICD-10-CM | POA: Insufficient documentation

## 2023-04-16 DIAGNOSIS — E785 Hyperlipidemia, unspecified: Secondary | ICD-10-CM | POA: Diagnosis not present

## 2023-04-16 DIAGNOSIS — Z79899 Other long term (current) drug therapy: Secondary | ICD-10-CM | POA: Insufficient documentation

## 2023-04-16 DIAGNOSIS — N183 Chronic kidney disease, stage 3 unspecified: Secondary | ICD-10-CM | POA: Diagnosis not present

## 2023-04-16 DIAGNOSIS — R809 Proteinuria, unspecified: Secondary | ICD-10-CM | POA: Diagnosis not present

## 2023-04-16 LAB — CBC
HCT: 45.3 % (ref 39.0–52.0)
Hemoglobin: 14.3 g/dL (ref 13.0–17.0)
MCH: 27.2 pg (ref 26.0–34.0)
MCHC: 31.6 g/dL (ref 30.0–36.0)
MCV: 86.3 fL (ref 80.0–100.0)
Platelets: 170 10*3/uL (ref 150–400)
RBC: 5.25 MIL/uL (ref 4.22–5.81)
RDW: 14.3 % (ref 11.5–15.5)
WBC: 4.6 10*3/uL (ref 4.0–10.5)
nRBC: 0 % (ref 0.0–0.2)

## 2023-04-16 LAB — PROTIME-INR
INR: 1.2 (ref 0.8–1.2)
Prothrombin Time: 15 seconds (ref 11.4–15.2)

## 2023-04-16 MED ORDER — LIDOCAINE HCL (PF) 1 % IJ SOLN
10.0000 mL | Freq: Once | INTRAMUSCULAR | Status: AC
  Administered 2023-04-16: 10 mL via INTRADERMAL

## 2023-04-16 MED ORDER — SODIUM CHLORIDE 0.9 % IV SOLN: INTRAVENOUS | Status: DC

## 2023-04-16 MED ORDER — FENTANYL CITRATE (PF) 100 MCG/2ML IJ SOLN
INTRAMUSCULAR | Status: AC
  Filled 2023-04-16: qty 2

## 2023-04-16 MED ORDER — MIDAZOLAM HCL 2 MG/2ML IJ SOLN
INTRAMUSCULAR | Status: AC | PRN
  Administered 2023-04-16: 1 mg via INTRAVENOUS

## 2023-04-16 MED ORDER — HYDRALAZINE HCL 20 MG/ML IJ SOLN
INTRAMUSCULAR | Status: AC
  Filled 2023-04-16: qty 1

## 2023-04-16 MED ORDER — HYDRALAZINE HCL 20 MG/ML IJ SOLN
10.0000 mg | Freq: Once | INTRAMUSCULAR | Status: AC
  Administered 2023-04-16: 10 mg via INTRAVENOUS

## 2023-04-16 MED ORDER — FENTANYL CITRATE (PF) 100 MCG/2ML IJ SOLN
INTRAMUSCULAR | Status: AC | PRN
  Administered 2023-04-16: 50 ug via INTRAVENOUS

## 2023-04-16 MED ORDER — GELATIN ABSORBABLE 12-7 MM EX MISC
1.0000 | Freq: Once | CUTANEOUS | Status: AC
  Administered 2023-04-16: 1 via TOPICAL

## 2023-04-16 MED ORDER — MIDAZOLAM HCL 2 MG/2ML IJ SOLN
INTRAMUSCULAR | Status: AC
  Filled 2023-04-16: qty 2

## 2023-04-16 MED ORDER — HYDRALAZINE HCL 20 MG/ML IJ SOLN
10.0000 mg | Freq: Once | INTRAMUSCULAR | Status: AC
  Administered 2023-04-16: 10 mg via INTRAVENOUS
  Filled 2023-04-16: qty 1

## 2023-04-16 MED ORDER — HYDRALAZINE HCL 20 MG/ML IJ SOLN
INTRAMUSCULAR | Status: AC | PRN
  Administered 2023-04-16: 10 mg via INTRAVENOUS

## 2023-04-16 NOTE — Procedures (Signed)
Interventional Radiology Procedure Note  Procedure: US guided biopsy of left kidney, medical renal Complications: None EBL: None Recommendations: - Bedrest 2 hours.   - Routine wound care - Advance diet  - ok to restart all meds  Signed,  Gilmer Mor, DO

## 2023-04-18 ENCOUNTER — Encounter (HOSPITAL_COMMUNITY): Payer: Self-pay

## 2023-04-18 LAB — SURGICAL PATHOLOGY

## 2023-05-01 ENCOUNTER — Ambulatory Visit (HOSPITAL_COMMUNITY): Payer: Medicare Other | Attending: Cardiology

## 2023-05-01 DIAGNOSIS — I480 Paroxysmal atrial fibrillation: Secondary | ICD-10-CM | POA: Diagnosis not present

## 2023-05-01 DIAGNOSIS — I10 Essential (primary) hypertension: Secondary | ICD-10-CM | POA: Diagnosis not present

## 2023-05-01 LAB — ECHOCARDIOGRAM COMPLETE
Area-P 1/2: 3.08 cm2
S' Lateral: 3 cm

## 2023-05-02 ENCOUNTER — Other Ambulatory Visit: Payer: Self-pay | Admitting: Family Medicine

## 2023-05-02 DIAGNOSIS — I10 Essential (primary) hypertension: Secondary | ICD-10-CM

## 2023-05-03 NOTE — Telephone Encounter (Signed)
Last OV 02/02/23, Too Soon, Last filled 04/02/23 #90 0RF Requested Prescriptions  Pending Prescriptions Disp Refills   amLODipine (NORVASC) 5 MG tablet [Pharmacy Med Name: amLODIPine Besylate 5 MG Oral Tablet] 90 tablet 0    Sig: Take 1 tablet by mouth once daily     Cardiovascular: Calcium Channel Blockers 2 Failed - 05/02/2023 10:42 AM      Failed - Valid encounter within last 6 months    Recent Outpatient Visits           1 year ago Diabetes mellitus type 2 with complications (HCC)   [facility] Pickard, Priscille Heidelberg, MD   2 years ago Left hand pain   [facility] Tanya Nones, Priscille Heidelberg, MD   2 years ago Left hand pain   [facility] Tanya Nones, Priscille Heidelberg, MD   2 years ago Goiter   [facility] Medicine Tanya Nones, Priscille Heidelberg, MD   2 years ago Benign essential HTN   [facility] Pickard, Priscille Heidelberg, MD              Passed - Last BP in normal range    BP Readings from Last 1 Encounters:  04/16/23 118/69         Passed - Last Heart Rate in normal range    Pulse Readings from Last 1 Encounters:  04/16/23 65

## 2023-05-07 ENCOUNTER — Other Ambulatory Visit: Payer: Self-pay | Admitting: Family Medicine

## 2023-05-07 DIAGNOSIS — C61 Malignant neoplasm of prostate: Secondary | ICD-10-CM | POA: Diagnosis not present

## 2023-05-07 DIAGNOSIS — I10 Essential (primary) hypertension: Secondary | ICD-10-CM

## 2023-05-07 DIAGNOSIS — Z8546 Personal history of malignant neoplasm of prostate: Secondary | ICD-10-CM | POA: Diagnosis not present

## 2023-05-08 ENCOUNTER — Other Ambulatory Visit: Payer: Self-pay | Admitting: Family Medicine

## 2023-05-08 NOTE — Telephone Encounter (Signed)
Requested Prescriptions  Refused Prescriptions Disp Refills   amLODipine (NORVASC) 5 MG tablet [Pharmacy Med Name: amLODIPine Besylate 5 MG Oral Tablet] 90 tablet 0    Sig: Take 1 tablet by mouth once daily     Cardiovascular: Calcium Channel Blockers 2 Failed - 05/07/2023 10:47 AM      Failed - Valid encounter within last 6 months    Recent Outpatient Visits           1 year ago Diabetes mellitus type 2 with complications (HCC)   [facility] Pickard, Priscille Heidelberg, MD   2 years ago Left hand pain   [facility] Tanya Nones, Priscille Heidelberg, MD   2 years ago Left hand pain   [facility] Tanya Nones, Priscille Heidelberg, MD   2 years ago Goiter   [facility] Tanya Nones, Priscille Heidelberg, MD   2 years ago Benign essential HTN   [facility] Pickard, Priscille Heidelberg, MD              Passed - Last BP in normal range    BP Readings from Last 1 Encounters:  04/16/23 118/69         Passed - Last Heart Rate in normal range    Pulse Readings from Last 1 Encounters:  04/16/23 65

## 2023-05-09 ENCOUNTER — Other Ambulatory Visit: Payer: Self-pay | Admitting: Family Medicine

## 2023-05-09 DIAGNOSIS — I10 Essential (primary) hypertension: Secondary | ICD-10-CM

## 2023-05-09 NOTE — Telephone Encounter (Signed)
Pharmacy also sent eFax for refill of lisinopril (ZESTRIL) 40 MG tablet  Please see previous message in thread for requested 90 day refill of amLODipine (NORVASC) 5 MG tablet

## 2023-05-09 NOTE — Telephone Encounter (Signed)
Last OV 02/02/23 Requested Prescriptions  Pending Prescriptions Disp Refills   lisinopril (ZESTRIL) 40 MG tablet [Pharmacy Med Name: Lisinopril 40 MG Oral Tablet] 90 tablet 0    Sig: Take 1 tablet by mouth once daily     Cardiovascular:  ACE Inhibitors Failed - 05/08/2023 10:20 AM      Failed - Cr in normal range and within 180 days    Creat  Date Value Ref Range Status  02/02/2023 1.79 (H) 0.70 - 1.28 mg/dL Final   Creatinine, Urine  Date Value Ref Range Status  02/02/2023 42 20 - 320 mg/dL Final         Failed - Valid encounter within last 6 months    Recent Outpatient Visits           1 year ago Diabetes mellitus type 2 with complications (HCC)   [facility] Donita Brooks, MD   2 years ago Left hand pain   [facility] Tanya Nones, Priscille Heidelberg, MD   2 years ago Left hand pain   [facility] Tanya Nones, Priscille Heidelberg, MD   2 years ago Goiter   [facility] Medicine Pickard, Priscille Heidelberg, MD   2 years ago Benign essential HTN   [facility] Tanya Nones, Priscille Heidelberg, MD              Passed - K in normal range and within 180 days    Potassium  Date Value Ref Range Status  02/02/2023 4.3 3.5 - 5.3 mmol/L Final         Passed - Patient is not pregnant      Passed - Last BP in normal range    BP Readings from Last 1 Encounters:  04/16/23 118/69

## 2023-05-09 NOTE — Telephone Encounter (Signed)
Pharmacy sent refill request for a 90 day script of amLODipine (NORVASC) 5 MG tablet   Pharmacy:     Please advise pharmacist at  [phone] .

## 2023-05-10 NOTE — Telephone Encounter (Signed)
Call to pharmacy- patient is not due Rx refill yet- verified- they will note. Rx 04/02/23 #90- too soon Requested Prescriptions  Pending Prescriptions Disp Refills   amLODipine (NORVASC) 5 MG tablet [Pharmacy Med Name: amLODIPine Besylate 5 MG Oral Tablet] 90 tablet 0    Sig: Take 1 tablet by mouth once daily     Cardiovascular: Calcium Channel Blockers 2 Failed - 05/09/2023 12:40 PM      Failed - Valid encounter within last 6 months    Recent Outpatient Visits           1 year ago Diabetes mellitus type 2 with complications (HCC)   [facility] Pickard, Priscille Heidelberg, MD   2 years ago Left hand pain   [facility] Tanya Nones, Priscille Heidelberg, MD   2 years ago Left hand pain   [facility] Tanya Nones, Priscille Heidelberg, MD   2 years ago Goiter   [facility] Tanya Nones, Priscille Heidelberg, MD   2 years ago Benign essential HTN   [facility] Pickard, Priscille Heidelberg, MD              Passed - Last BP in normal range    BP Readings from Last 1 Encounters:  04/16/23 118/69         Passed - Last Heart Rate in normal range    Pulse Readings from Last 1 Encounters:  04/16/23 65

## 2023-05-14 DIAGNOSIS — N471 Phimosis: Secondary | ICD-10-CM | POA: Diagnosis not present

## 2023-05-14 DIAGNOSIS — N476 Balanoposthitis: Secondary | ICD-10-CM | POA: Diagnosis not present

## 2023-05-14 DIAGNOSIS — Z8546 Personal history of malignant neoplasm of prostate: Secondary | ICD-10-CM | POA: Diagnosis not present

## 2023-06-03 ENCOUNTER — Other Ambulatory Visit: Payer: Self-pay | Admitting: Family Medicine

## 2023-06-05 NOTE — Telephone Encounter (Signed)
Requested Prescriptions  Pending Prescriptions Disp Refills   atorvastatin (LIPITOR) 40 MG tablet [Pharmacy Med Name: Atorvastatin Calcium 40 MG Oral Tablet] 90 tablet 0    Sig: Take 1 tablet by mouth once daily     Cardiovascular:  Antilipid - Statins Failed - 06/03/2023 12:25 PM      Failed - Valid encounter within last 12 months    Recent Outpatient Visits           1 year ago Diabetes mellitus type 2 with complications (HCC)   [facility] Pickard, Priscille Heidelberg, MD   2 years ago Left hand pain   [facility] Tanya Nones, Priscille Heidelberg, MD   2 years ago Left hand pain   [facility] Tanya Nones, Priscille Heidelberg, MD   2 years ago Goiter   [facility] Tanya Nones, Priscille Heidelberg, MD   3 years ago Benign essential HTN   [facility] Donita Brooks, MD              Failed - Lipid Panel in normal range within the last 12 months    Cholesterol  Date Value Ref Range Status  02/02/2023 145 <200 mg/dL Final   LDL Cholesterol (Calc)  Date Value Ref Range Status  02/02/2023 82 mg/dL (calc) Final    Comment:    Reference range: <100 . Desirable range <100 mg/dL for primary prevention;   <70 mg/dL for patients with CHD or diabetic patients  with > or = 2 CHD risk factors. Marland Kitchen LDL-C is now calculated using the Martin-Hopkins  calculation, which is a validated novel method providing  better accuracy than the Friedewald equation in the  estimation of LDL-C.  Horald Pollen et al. Lenox Ahr. 1610;960(45): 2061-2068  (http://education.[facility].com/faq/FAQ164)    HDL  Date Value Ref Range Status  02/02/2023 41 > OR = 40 mg/dL Final   Triglycerides  Date Value Ref Range Status  02/02/2023 120 <150 mg/dL Final         Passed - Patient is not pregnant

## 2023-06-08 DIAGNOSIS — N2581 Secondary hyperparathyroidism of renal origin: Secondary | ICD-10-CM | POA: Diagnosis not present

## 2023-06-08 DIAGNOSIS — I129 Hypertensive chronic kidney disease with stage 1 through stage 4 chronic kidney disease, or unspecified chronic kidney disease: Secondary | ICD-10-CM | POA: Diagnosis not present

## 2023-06-08 DIAGNOSIS — N1832 Chronic kidney disease, stage 3b: Secondary | ICD-10-CM | POA: Diagnosis not present

## 2023-06-08 DIAGNOSIS — D631 Anemia in chronic kidney disease: Secondary | ICD-10-CM | POA: Diagnosis not present

## 2023-06-08 DIAGNOSIS — R809 Proteinuria, unspecified: Secondary | ICD-10-CM | POA: Diagnosis not present

## 2023-06-27 ENCOUNTER — Other Ambulatory Visit: Payer: Medicare Other

## 2023-06-27 ENCOUNTER — Telehealth: Payer: Self-pay | Admitting: Family Medicine

## 2023-06-27 DIAGNOSIS — I4891 Unspecified atrial fibrillation: Secondary | ICD-10-CM | POA: Diagnosis not present

## 2023-06-27 NOTE — Telephone Encounter (Signed)
Patient changing from Coumadin to Eliquis and needs a PTINR test; scheduled to come to lab this afternoon.

## 2023-06-27 NOTE — Telephone Encounter (Signed)
Pt came by for PT/INR. [facility] Eliqus 5 mg per pt's daughter, Patsy Lager. Yolanda states pt's PT/INR needs to be less than 2 to start the Eliquis. Blood draw PT/INR ordered instead of fingerstick, since you were out of the office.   Patsy Lager also asks if an order for a colonoscopy can be placed because the patient has not completed the cologuard. Thank you.  Henrietta Hoover can be reached with lab results at [phone].

## 2023-06-28 ENCOUNTER — Other Ambulatory Visit: Payer: Medicare Other

## 2023-06-28 LAB — PROTIME-INR
INR: 3.1 — ABNORMAL HIGH
Prothrombin Time: 30.3 s — ABNORMAL HIGH (ref 9.0–11.5)

## 2023-07-05 ENCOUNTER — Encounter: Payer: Medicare Other | Admitting: Pharmacist

## 2023-07-16 ENCOUNTER — Telehealth: Payer: Self-pay

## 2023-07-16 NOTE — Telephone Encounter (Signed)
Pt's daughter called in concerned about pt's bp. Pt's daughter stated that pt's bp had been running high. Pt's daughter reported Wed 10/16 BP 170/80 HR 68 with a fasting blood sugar of 112,Sat 10/19 BP 167/93 HR 65 blood sugar 108. Pt's daughter would like to speak with nurse please. Please advise.  Cb#: [phone]

## 2023-07-17 ENCOUNTER — Ambulatory Visit (INDEPENDENT_AMBULATORY_CARE_PROVIDER_SITE_OTHER): Payer: Medicare Other | Admitting: Family Medicine

## 2023-07-17 VITALS — BP 148/86 | HR 71 | Temp 97.8°F | Ht 68.0 in | Wt 195.4 lb

## 2023-07-17 DIAGNOSIS — Z7901 Long term (current) use of anticoagulants: Secondary | ICD-10-CM | POA: Insufficient documentation

## 2023-07-17 DIAGNOSIS — E118 Type 2 diabetes mellitus with unspecified complications: Secondary | ICD-10-CM

## 2023-07-17 DIAGNOSIS — N183 Chronic kidney disease, stage 3 unspecified: Secondary | ICD-10-CM | POA: Diagnosis not present

## 2023-07-17 DIAGNOSIS — M109 Gout, unspecified: Secondary | ICD-10-CM | POA: Insufficient documentation

## 2023-07-17 DIAGNOSIS — I4891 Unspecified atrial fibrillation: Secondary | ICD-10-CM | POA: Insufficient documentation

## 2023-07-17 DIAGNOSIS — Z0289 Encounter for other administrative examinations: Secondary | ICD-10-CM | POA: Insufficient documentation

## 2023-07-17 MED ORDER — AMLODIPINE BESYLATE 10 MG PO TABS: 10.0000 mg | ORAL_TABLET | Freq: Every day | ORAL | 3 refills | Status: AC

## 2023-07-17 NOTE — Progress Notes (Signed)
Subjective:    Patient ID: Travis Singh, male    DOB: 1945/10/12, 77 y.o.   MRN: [id number]  Hypertension   Patient is a 77 year old African-American gentleman with a history of type 2 diabetes mellitus, chronic kidney disease, and proteinuria.  Patient seeks his medical care at the [place].  However his daughter made an appointment today because his blood pressure has been running high at home.  His blood pressure at home was 160-170/90.  Here today I personally checked his blood pressure and found it to be 148 systolic.  He denies any chest pain shortness of breath or dyspnea on exertion. Past Medical History:  Diagnosis Date  . Bulging lumbar disc    multiple levels  . Diabetes mellitus type 2 with complications (HCC)   . ED (erectile dysfunction)   . Gout   . History of adenomatous polyp of colon   . Hyperlipidemia   . Hypertension   . Left carotid artery stenosis <50%  . Left sciatic nerve pain   . Lumbar stenosis   . Nocturia   . Prostate cancer [facility]) urologist-  dr wrenn/ oncologist-  dr Kathrynn Running   dx 01-30-2017 w/ Gleason 3+3, PSA 4.59, active survillance/  biopsy 02-06-2018 , Stage T1c, Gleason 3+4, PSA 7.08-- scheduled for radioactive seed implants  . Thyroid mass    see [place] 8/20  . Wears glasses    Past Surgical History:  Procedure Laterality Date  . CYSTOSCOPY  06/20/2018   Procedure: CYSTOSCOPY FLEXIBLE;  Surgeon: Bjorn Pippin, MD;  Location: [facility];  Service: Urology;;  NO SEEDS FOUND IN BLADDER  . NO PAST SURGERIES    . PROSTATE BIOPSY  01-30-2017;  02-06-2018  dr Annabell Howells office  . RADIOACTIVE SEED IMPLANT N/A 06/20/2018   Procedure: RADIOACTIVE SEED IMPLANT/BRACHYTHERAPY IMPLANT;  Surgeon: Bjorn Pippin, MD;  Location: [facility];  Service: Urology;  Laterality: N/A;   86   SEEDS IMPLANTED  . SPACE OAR INSTILLATION N/A 06/20/2018   Procedure: SPACE OAR INSTILLATION;  Surgeon: Bjorn Pippin, MD;  Location: [facility];  Service:  Urology;  Laterality: N/A;   Current Outpatient Medications on File Prior to Visit  Medication Sig Dispense Refill  . allopurinol (ZYLOPRIM) 300 MG tablet Take 1 tablet by mouth once daily (Patient not taking: Reported on 02/02/2023) 90 tablet 3  . atorvastatin (LIPITOR) 40 MG tablet Take 1 tablet by mouth once daily 90 tablet 0  . colchicine 0.6 MG tablet Take 1 tablet (0.6 mg total) by mouth daily. 90 tablet 1  . dapagliflozin propanediol (FARXIGA) 10 MG TABS tablet Take 1 tablet (10 mg total) by mouth daily before breakfast. 30 tablet 1  . lisinopril (ZESTRIL) 40 MG tablet Take 1 tablet by mouth once daily 90 tablet 0  . metoprolol succinate (TOPROL-XL) 25 MG 24 hr tablet Take 1 tablet (25 mg total) by mouth daily. 90 tablet 3  . warfarin (COUMADIN) 5 MG tablet Take 1 tablet (5 mg total) by mouth daily. As directed 30 tablet 3   No current facility-administered medications on file prior to visit.   No Known Allergies Social History   Socioeconomic History  . Marital status: Married    Spouse name: Not on file  . Number of children: Not on file  . Years of education: Not on file  . Highest education level: Not on file  Occupational History  . Occupation: Corporate investment banker  Tobacco Use  . Smoking status: Some Days  Types: Cigars  . Smokeless tobacco: Never  . Tobacco comments:    06-13-2018  per pt every once in a while , cigar  Vaping Use  . Vaping status: Never Used  Substance and Sexual Activity  . Alcohol use: Not Currently    Alcohol/week: 7.0 standard drinks of alcohol    Types: 7 Cans of beer per week  . Drug use: No  . Sexual activity: Not Currently  Other Topics Concern  . 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 Works in Holiday representative.    Social Determinants of Health   Financial Resource Strain: Low Risk  (03/09/2022)   Overall Financial Resource Strain (CARDIA)   . Difficulty of Paying Living Expenses: Not hard at  all  Food Insecurity: No Food Insecurity (03/09/2022)   Hunger Vital Sign   . Worried About Programme researcher, broadcasting/film/video in the Last Year: Never true   . Ran Out of Food in the Last Year: Never true  Transportation Needs: No Transportation Needs (03/09/2022)   PRAPARE - Transportation   . Lack of Transportation (Medical): No   . Lack of Transportation (Non-Medical): No  Physical Activity: Sufficiently Active (03/09/2022)   Exercise Vital Sign   . Days of Exercise per Week: 3 days   . Minutes of Exercise per Session: 60 min  Stress: No Stress Concern Present (03/09/2022)   [employer] of Occupational Health - Occupational Stress Questionnaire   . Feeling of Stress : Not at all  Social Connections: Socially Integrated (03/09/2022)   Social Connection and Isolation Panel [NHANES]   . Frequency of Communication with Friends and Family: More than three times a week   . Frequency of Social Gatherings with Friends and Family: More than three times a week   . Attends Religious Services: More than 4 times per year   . Active Member of Clubs or Organizations: Yes   . Attends Banker Meetings: More than 4 times per year   . Marital Status: Married  Catering manager Violence: Not At Risk (03/09/2022)   Humiliation, Afraid, Rape, and Kick questionnaire   . Fear of Current or Ex-Partner: No   . Emotionally Abused: No   . Physically Abused: No   . Sexually Abused: No   Family History  Problem Relation Age of Onset  . Pancreatic cancer Mother   . Leukemia Father   . Lung cancer Brother   . Lung cancer Brother   . Lung cancer Brother   '    Review of Systems  All other systems reviewed and are negative.      Objective:   Physical Exam Constitutional:      General: He is not in acute distress.    Appearance: Normal appearance. He is obese. He is not ill-appearing, toxic-appearing or diaphoretic.  HENT:     Head: Normocephalic and atraumatic.   Neck:     Thyroid: Thyromegaly  present.  Cardiovascular:     Rate and Rhythm: Normal rate and regular rhythm.     Pulses: Normal pulses.     Heart sounds: Normal heart sounds. No murmur heard.    No friction rub. No gallop.  Pulmonary:     Effort: Pulmonary effort is normal. No respiratory distress.     Breath sounds: Normal breath sounds. No stridor. No wheezing, rhonchi or rales.  Chest:     Chest wall: No tenderness.  Musculoskeletal:  General: Normal range of motion.     Cervical back: Neck supple. No muscular tenderness.     Right lower leg: No edema.     Left lower leg: No edema.  Skin:    General: Skin is warm.     Coloration: Skin is not jaundiced or pale.     Findings: No bruising, erythema, lesion or rash.  Neurological:     General: No focal deficit present.     Mental Status: He is alert and oriented to person, place, and time.     Cranial Nerves: No cranial nerve deficit.     Sensory: No sensory deficit.     Motor: No weakness.     Coordination: Coordination normal.     Gait: Gait normal.     Deep Tendon Reflexes: Reflexes normal.  Psychiatric:        Mood and Affect: Mood normal.        Behavior: Behavior normal.        Thought Content: Thought content normal.        Judgment: Judgment normal.          Assessment & Plan:  Diabetes mellitus type 2 with complications (HCC) - Plan: BASIC METABOLIC PANEL WITH GFR, Hemoglobin A1c  Stage 3 chronic kidney disease, unspecified whether stage 3a or 3b CKD (HCC) - Plan: BASIC METABOLIC PANEL WITH GFR, Hemoglobin A1c Recommended increasing amlodipine to 10 mg a day and [facility].  Also check baseline lab work to ensure that his kidney function has not worsened as well as check a hemoglobin A1c

## 2023-07-18 LAB — BASIC METABOLIC PANEL WITH GFR
BUN/Creatinine Ratio: 16 (calc) (ref 6–22)
BUN: 32 mg/dL — ABNORMAL HIGH (ref 7–25)
CO2: 20 mmol/L (ref 20–32)
Calcium: 8.6 mg/dL (ref 8.6–10.3)
Chloride: 113 mmol/L — ABNORMAL HIGH (ref 98–110)
Creat: 2.01 mg/dL — ABNORMAL HIGH (ref 0.70–1.28)
Glucose, Bld: 149 mg/dL — ABNORMAL HIGH (ref 65–99)
Potassium: 4.3 mmol/L (ref 3.5–5.3)
Sodium: 143 mmol/L (ref 135–146)
eGFR: 33 mL/min/{1.73_m2} — ABNORMAL LOW (ref >=60)

## 2023-07-18 LAB — HEMOGLOBIN A1C
Hgb A1c MFr Bld: 6.7 %{Hb} — ABNORMAL HIGH (ref <5.7)
Mean Plasma Glucose: 146 mg/dL
eAG (mmol/L): 8.1 mmol/L

## 2023-07-27 ENCOUNTER — Ambulatory Visit: Payer: Medicare Other

## 2023-07-27 DIAGNOSIS — N1832 Chronic kidney disease, stage 3b: Secondary | ICD-10-CM | POA: Diagnosis not present

## 2023-07-30 ENCOUNTER — Ambulatory Visit: Payer: Medicare Other

## 2023-07-30 VITALS — BP 126/84 | Ht 68.0 in | Wt 194.0 lb

## 2023-07-30 DIAGNOSIS — I10 Essential (primary) hypertension: Secondary | ICD-10-CM

## 2023-07-30 NOTE — Progress Notes (Signed)
Pt is here for BP check after medication change. Pt advised to continue current medication and to return to office if any issues.

## 2023-10-11 DIAGNOSIS — E1122 Type 2 diabetes mellitus with diabetic chronic kidney disease: Secondary | ICD-10-CM | POA: Diagnosis not present

## 2023-10-11 DIAGNOSIS — R809 Proteinuria, unspecified: Secondary | ICD-10-CM | POA: Diagnosis not present

## 2023-10-11 DIAGNOSIS — D631 Anemia in chronic kidney disease: Secondary | ICD-10-CM | POA: Diagnosis not present

## 2023-10-11 DIAGNOSIS — I129 Hypertensive chronic kidney disease with stage 1 through stage 4 chronic kidney disease, or unspecified chronic kidney disease: Secondary | ICD-10-CM | POA: Diagnosis not present

## 2023-10-11 DIAGNOSIS — N1832 Chronic kidney disease, stage 3b: Secondary | ICD-10-CM | POA: Diagnosis not present

## 2023-10-12 LAB — LAB REPORT - SCANNED
Albumin, Urine POC: 1362.1
Creatinine, POC: 91.2 mg/dL
EGFR: 34
Microalb Creat Ratio: 1494

## 2023-11-05 DIAGNOSIS — N1832 Chronic kidney disease, stage 3b: Secondary | ICD-10-CM | POA: Diagnosis not present

## 2023-12-03 DIAGNOSIS — N5201 Erectile dysfunction due to arterial insufficiency: Secondary | ICD-10-CM | POA: Diagnosis not present

## 2023-12-03 DIAGNOSIS — N476 Balanoposthitis: Secondary | ICD-10-CM | POA: Diagnosis not present

## 2023-12-03 DIAGNOSIS — C61 Malignant neoplasm of prostate: Secondary | ICD-10-CM | POA: Diagnosis not present

## 2023-12-03 DIAGNOSIS — R351 Nocturia: Secondary | ICD-10-CM | POA: Diagnosis not present

## 2023-12-03 DIAGNOSIS — Z8546 Personal history of malignant neoplasm of prostate: Secondary | ICD-10-CM | POA: Diagnosis not present

## 2024-01-21 DIAGNOSIS — E1122 Type 2 diabetes mellitus with diabetic chronic kidney disease: Secondary | ICD-10-CM | POA: Diagnosis not present

## 2024-01-21 DIAGNOSIS — N1832 Chronic kidney disease, stage 3b: Secondary | ICD-10-CM | POA: Diagnosis not present

## 2024-01-31 DIAGNOSIS — N189 Chronic kidney disease, unspecified: Secondary | ICD-10-CM | POA: Diagnosis not present

## 2024-01-31 DIAGNOSIS — N2581 Secondary hyperparathyroidism of renal origin: Secondary | ICD-10-CM | POA: Diagnosis not present

## 2024-01-31 DIAGNOSIS — E1129 Type 2 diabetes mellitus with other diabetic kidney complication: Secondary | ICD-10-CM | POA: Diagnosis not present

## 2024-01-31 DIAGNOSIS — I129 Hypertensive chronic kidney disease with stage 1 through stage 4 chronic kidney disease, or unspecified chronic kidney disease: Secondary | ICD-10-CM | POA: Diagnosis not present

## 2024-01-31 DIAGNOSIS — N1832 Chronic kidney disease, stage 3b: Secondary | ICD-10-CM | POA: Diagnosis not present

## 2024-01-31 DIAGNOSIS — D631 Anemia in chronic kidney disease: Secondary | ICD-10-CM | POA: Diagnosis not present

## 2024-02-15 DIAGNOSIS — N1832 Chronic kidney disease, stage 3b: Secondary | ICD-10-CM | POA: Diagnosis not present

## 2024-02-25 ENCOUNTER — Ambulatory Visit (INDEPENDENT_AMBULATORY_CARE_PROVIDER_SITE_OTHER): Admitting: Family Medicine

## 2024-02-25 ENCOUNTER — Encounter: Payer: Self-pay | Admitting: Family Medicine

## 2024-02-25 VITALS — BP 120/82 | HR 63 | Temp 98.3°F | Ht 68.0 in | Wt 194.2 lb

## 2024-02-25 DIAGNOSIS — R634 Abnormal weight loss: Secondary | ICD-10-CM

## 2024-02-25 DIAGNOSIS — R739 Hyperglycemia, unspecified: Secondary | ICD-10-CM

## 2024-02-25 DIAGNOSIS — Z8546 Personal history of malignant neoplasm of prostate: Secondary | ICD-10-CM

## 2024-02-25 NOTE — Progress Notes (Signed)
 Wt Readings from Last 3 Encounters:  02/25/24 194 lb 3.2 oz (88.1 kg)  07/30/23 194 lb (88 kg)  07/17/23 195 lb 6.4 oz (88.6 kg)     Subjective:    Patient ID: Travis Singh, male    DOB: 27-Aug-1946, 78 y.o.   MRN: [id number]  Patient's daughter is concerned because she believes that he has lost more than 10 pounds.  According to our scales, his weight is stable from October at 195 pounds.  He states that he is doing well.  Specifically he denies any abdominal pain, melena, hematochezia, chest pain, shortness of breath, nausea, vomiting.  Denies any fevers or chills.  He denies bone pain or fatigue.  He does have a history of prostate cancer.  He is uncertain of his last PSA. Past Medical History:  Diagnosis Date   Bulging lumbar disc    multiple levels   Diabetes mellitus type 2 with complications [facility])    ED (erectile dysfunction)    Gout    History of adenomatous polyp of colon    Hyperlipidemia    Hypertension    Left carotid artery stenosis <50%   Left sciatic nerve pain    Lumbar stenosis    Nocturia    Prostate cancer [facility]) urologist-  dr wrenn/ oncologist-  dr Lorri Rota   dx 01-30-2017 w/ Gleason 3+3, PSA 4.59, active survillance/  biopsy 02-06-2018 , Stage T1c, Gleason 3+4, PSA 7.08-- scheduled for radioactive seed implants   Thyroid  mass    see US  8/20   Wears glasses    Past Surgical History:  Procedure Laterality Date   CYSTOSCOPY  06/20/2018   Procedure: CYSTOSCOPY FLEXIBLE;  Surgeon: Homero Luster, MD;  Location: [facility];  Service: Urology;;  NO SEEDS FOUND IN BLADDER   NO PAST SURGERIES     PROSTATE BIOPSY  01-30-2017;  02-06-2018  dr Inga Manges office   RADIOACTIVE SEED IMPLANT N/A 06/20/2018   Procedure: RADIOACTIVE SEED IMPLANT/BRACHYTHERAPY IMPLANT;  Surgeon: Homero Luster, MD;  Location: [facility];  Service: Urology;  Laterality: N/A;   86   SEEDS IMPLANTED   SPACE OAR INSTILLATION N/A 06/20/2018   Procedure: SPACE OAR INSTILLATION;   Surgeon: Homero Luster, MD;  Location: [facility];  Service: Urology;  Laterality: N/A;   Current Outpatient Medications on File Prior to Visit  Medication Sig Dispense Refill   amLODipine  (NORVASC ) 10 MG tablet Take 1 tablet (10 mg total) by mouth daily. 90 tablet 3   apixaban  (ELIQUIS ) 5 MG TABS tablet Take 5 mg by mouth 2 (two) times daily.     atorvastatin  (LIPITOR) 40 MG tablet Take 1 tablet by mouth once daily 90 tablet 0   colchicine  0.6 MG tablet Take 1 tablet (0.6 mg total) by mouth daily. 90 tablet 1   empagliflozin (JARDIANCE) 25 MG TABS tablet Take 25 mg by mouth daily.     Finerenone (KERENDIA) 10 MG TABS Take by mouth.     lisinopril  (ZESTRIL ) 40 MG tablet Take 1 tablet by mouth once daily 90 tablet 0   metoprolol  succinate (TOPROL -XL) 25 MG 24 hr tablet Take 1 tablet (25 mg total) by mouth daily. 90 tablet 3   allopurinol  (ZYLOPRIM ) 300 MG tablet Take 1 tablet by mouth once daily (Patient not taking: Reported on 02/25/2024) 90 tablet 3   dapagliflozin  propanediol (FARXIGA ) 10 MG TABS tablet Take 1 tablet (10 mg total) by mouth daily before breakfast. (Patient not taking: Reported on 02/25/2024) 30 tablet  1   warfarin (COUMADIN ) 5 MG tablet Take 1 tablet (5 mg total) by mouth daily. As directed (Patient not taking: Reported on 02/25/2024) 30 tablet 3   No current facility-administered medications on file prior to visit.   No Known Allergies Social History   Socioeconomic History   Marital status: Married    Spouse name: Not on file   Number of children: Not on file   Years of education: Not on file   Highest education level: Not on file  Occupational History   Occupation: Corporate investment banker  Tobacco Use   Smoking status: Some Days    Types: Cigars   Smokeless tobacco: Never   Tobacco comments:    06-13-2018  per pt every once in a while , cigar  Vaping Use   Vaping status: Never Used  Substance and Sexual Activity   Alcohol use: Not Currently     Alcohol/week: 7.0 standard drinks of alcohol    Types: 7 Cans of beer per week   Drug use: No   Sexual activity: Not Currently  Other Topics Concern   Not on file  Social History Narrative   Married with one daughter and three sons. Works in Holiday representative.    Social Drivers of Corporate investment banker Strain: Low Risk  (03/09/2022)   Overall Financial Resource Strain (CARDIA)    Difficulty of Paying Living Expenses: Not hard at all  Food Insecurity: No Food Insecurity (03/09/2022)   Hunger Vital Sign    Worried About Running Out of Food in the Last Year: Never true    Ran Out of Food in the Last Year: Never true  Transportation Needs: No Transportation Needs (03/09/2022)   PRAPARE - Administrator, Civil Service (Medical): No    Lack of Transportation (Non-Medical): No  Physical Activity: Sufficiently Active (03/09/2022)   Exercise Vital Sign    Days of Exercise per Week: 3 days    Minutes of Exercise per Session: 60 min  Stress: No Stress Concern Present (03/09/2022)   [employer] of Occupational Health - Occupational Stress Questionnaire    Feeling of Stress : Not at all  Social Connections: Socially Integrated (03/09/2022)   Social Connection and Isolation Panel [NHANES]    Frequency of Communication with Friends and Family: More than three times a week    Frequency of Social Gatherings with Friends and Family: More than three times a week    Attends Religious Services: More than 4 times per year    Active Member of [employer] or Organizations: Yes    Attends Engineer, structural: More than 4 times per year    Marital Status: Married  Catering manager Violence: Not At Risk (03/09/2022)   Humiliation, Afraid, Rape, and Kick questionnaire    Fear of Current or Ex-Partner: No    Emotionally Abused: No    Physically Abused: No    Sexually Abused: No   Family History  Problem Relation Age of Onset   Pancreatic cancer Mother    Leukemia Father    Lung  cancer Brother    Lung cancer Brother    Lung cancer Brother   '    Review of Systems  All other systems reviewed and are negative.      Objective:   Physical Exam Constitutional:      General: He is not in acute distress.    Appearance: Normal appearance. He is obese. He is not ill-appearing, toxic-appearing or diaphoretic.  HENT:  Head: Normocephalic and atraumatic.  Neck:     Thyroid : Thyromegaly present.  Cardiovascular:     Rate and Rhythm: Normal rate and regular rhythm.     Pulses: Normal pulses.     Heart sounds: Normal heart sounds. No murmur heard.    No friction rub. No gallop.  Pulmonary:     Effort: Pulmonary effort is normal. No respiratory distress.     Breath sounds: Normal breath sounds. No stridor. No wheezing, rhonchi or rales.  Chest:     Chest wall: No tenderness.  Musculoskeletal:        General: Normal range of motion.     Cervical back: Neck supple. No muscular tenderness.     Right lower leg: No edema.     Left lower leg: No edema.  Skin:    General: Skin is warm.     Coloration: Skin is not jaundiced or pale.     Findings: No bruising, erythema, lesion or rash.  Neurological:     General: No focal deficit present.     Mental Status: He is alert and oriented to person, place, and time.     Cranial Nerves: No cranial nerve deficit.     Sensory: No sensory deficit.     Motor: No weakness.     Coordination: Coordination normal.     Gait: Gait normal.     Deep Tendon Reflexes: Reflexes normal.  Psychiatric:        Mood and Affect: Mood normal.        Behavior: Behavior normal.        Thought Content: Thought content normal.        Judgment: Judgment normal.           Assessment & Plan:  Weight loss - Plan: CBC with Differential/Platelet, Comprehensive metabolic panel with GFR, TSH, Hemoglobin A1c, Fecal Globin By Immunochemistry  Elevated blood sugar - Plan: Hemoglobin A1c  History of prostate cancer - Plan: PSA According to  our records, his weight is stable.  However, family believes he has lost more than 10 pounds.  Begin by checking a CBC, CMP, TSH, and A1c, as well as a stool test for blood.  Patient has been informed using this lab work to help target additional workup if necessary.  Given his history of prostate cancer I will also obtain a PSA and if significantly high, consider evaluation for metastases.

## 2024-02-26 ENCOUNTER — Ambulatory Visit: Payer: Self-pay | Admitting: Family Medicine

## 2024-02-26 LAB — CBC WITH DIFFERENTIAL/PLATELET
Absolute Lymphocytes: 1420 {cells}/uL (ref 850–3900)
Absolute Monocytes: 673 {cells}/uL (ref 200–950)
Basophils Absolute: 11 {cells}/uL (ref 0–200)
Basophils Relative: 0.2 %
Eosinophils Absolute: 90 {cells}/uL (ref 15–500)
Eosinophils Relative: 1.7 %
HCT: 42.3 % (ref 38.5–50.0)
Hemoglobin: 13.4 g/dL (ref 13.2–17.1)
MCH: 27.5 pg (ref 27.0–33.0)
MCHC: 31.7 g/dL — ABNORMAL LOW (ref 32.0–36.0)
MCV: 86.9 fL (ref 80.0–100.0)
MPV: 10.7 fL (ref 7.5–12.5)
Monocytes Relative: 12.7 %
Neutro Abs: 3106 {cells}/uL (ref 1500–7800)
Neutrophils Relative %: 58.6 %
Platelets: 181 10*3/uL (ref 140–400)
RBC: 4.87 10*6/uL (ref 4.20–5.80)
RDW: 13.4 % (ref 11.0–15.0)
Total Lymphocyte: 26.8 %
WBC: 5.3 10*3/uL (ref 3.8–10.8)

## 2024-02-26 LAB — COMPREHENSIVE METABOLIC PANEL WITH GFR
AG Ratio: 1.5 (calc) (ref 1.0–2.5)
ALT: 26 U/L (ref 9–46)
AST: 22 U/L (ref 10–35)
Albumin: 4.4 g/dL (ref 3.6–5.1)
Alkaline phosphatase (APISO): 66 U/L (ref 35–144)
BUN/Creatinine Ratio: 14 (calc) (ref 6–22)
BUN: 30 mg/dL — ABNORMAL HIGH (ref 7–25)
CO2: 20 mmol/L (ref 20–32)
Calcium: 9.4 mg/dL (ref 8.6–10.3)
Chloride: 112 mmol/L — ABNORMAL HIGH (ref 98–110)
Creat: 2.12 mg/dL — ABNORMAL HIGH (ref 0.70–1.22)
Globulin: 2.9 g/dL (ref 1.9–3.7)
Glucose, Bld: 88 mg/dL (ref 65–99)
Potassium: 4.4 mmol/L (ref 3.5–5.3)
Sodium: 142 mmol/L (ref 135–146)
Total Bilirubin: 0.6 mg/dL (ref 0.2–1.2)
Total Protein: 7.3 g/dL (ref 6.1–8.1)
eGFR: 31 mL/min/{1.73_m2} — ABNORMAL LOW (ref >=60)

## 2024-02-26 LAB — PSA: PSA: 0.06 ng/mL (ref <=4.00)

## 2024-02-26 LAB — TSH: TSH: 1.23 m[IU]/L (ref 0.40–4.50)

## 2024-02-26 LAB — HEMOGLOBIN A1C
Hgb A1c MFr Bld: 6.3 % — ABNORMAL HIGH (ref <5.7)
Mean Plasma Glucose: 134 mg/dL
eAG (mmol/L): 7.4 mmol/L

## 2024-03-03 ENCOUNTER — Other Ambulatory Visit: Payer: Self-pay | Admitting: Family Medicine

## 2024-03-03 DIAGNOSIS — R634 Abnormal weight loss: Secondary | ICD-10-CM | POA: Diagnosis not present

## 2024-03-06 LAB — HOUSE ACCOUNT TRACKING

## 2024-03-10 ENCOUNTER — Ambulatory Visit: Payer: Self-pay | Admitting: Family Medicine

## 2024-03-13 ENCOUNTER — Ambulatory Visit: Admitting: *Deleted

## 2024-03-13 DIAGNOSIS — Z0000 Encounter for general adult medical examination without abnormal findings: Secondary | ICD-10-CM | POA: Diagnosis not present

## 2024-03-13 NOTE — Progress Notes (Signed)
 Subjective:   Travis Singh is a 78 y.o. male who presents for Medicare Annual/Subsequent preventive examination.  Visit Complete: Virtual I connected with  [name] on 03/13/24 by a audio enabled telemedicine application and verified that I am speaking with the correct person using two identifiers.  Patient Location: Home  Provider Location: Home Office  I discussed the limitations of evaluation and management by telemedicine. The patient expressed understanding and agreed to proceed.  Vital Signs: Because this visit was a virtual/telehealth visit, some criteria may be missing or patient reported. Any vitals not documented were not able to be obtained and vitals that have been documented are patient reported.    Cardiac Risk Factors include: advanced age (>49men, >89 women);male gender;hypertension;diabetes mellitus     Objective:    There were no vitals filed for this visit. There is no height or weight on file to calculate BMI.     03/09/2022    8:35 AM 02/24/2021    9:52 AM 02/24/2021    9:24 AM 12/10/2020    1:09 PM 06/20/2018    6:38 AM  Advanced Directives  Does Patient Have a Medical Advance Directive? Yes Yes Yes No No   Type of Estate agent of [place];Living will Healthcare Power of [place];Living will Healthcare Power of [place];Living will    Does patient want to make changes to medical advance directive?  No - Patient declined No - Patient declined    Copy of Healthcare Power of Attorney in Chart? No - copy requested No - copy requested     Would patient like information on creating a medical advance directive?    No - Patient declined No - Patient declined      Data saved with a previous flowsheet row definition    Current Medications (verified) Outpatient Encounter Medications as of 03/13/2024  Medication Sig   amLODipine  (NORVASC ) 10 MG tablet Take 1 tablet (10 mg total) by mouth daily.   apixaban  (ELIQUIS ) 5 MG TABS tablet Take  5 mg by mouth 2 (two) times daily.   atorvastatin  (LIPITOR) 40 MG tablet Take 1 tablet by mouth once daily   colchicine  0.6 MG tablet Take 1 tablet (0.6 mg total) by mouth daily.   empagliflozin (JARDIANCE) 25 MG TABS tablet Take 25 mg by mouth daily.   Finerenone (KERENDIA) 10 MG TABS Take by mouth.   lisinopril  (ZESTRIL ) 40 MG tablet Take 1 tablet by mouth once daily   metoprolol  succinate (TOPROL -XL) 25 MG 24 hr tablet Take 1 tablet (25 mg total) by mouth daily.   allopurinol  (ZYLOPRIM ) 300 MG tablet Take 1 tablet by mouth once daily (Patient not taking: Reported on 03/13/2024)   dapagliflozin  propanediol (FARXIGA ) 10 MG TABS tablet Take 1 tablet (10 mg total) by mouth daily before breakfast. (Patient not taking: Reported on 03/13/2024)   warfarin (COUMADIN ) 5 MG tablet Take 1 tablet (5 mg total) by mouth daily. As directed (Patient not taking: Reported on 03/13/2024)   No facility-administered encounter medications on file as of 03/13/2024.    Allergies (verified) Patient has no known allergies.   History: Past Medical History:  Diagnosis Date   Bulging lumbar disc    multiple levels   Diabetes mellitus type 2 with complications [facility])    ED (erectile dysfunction)    Gout    History of adenomatous polyp of colon    Hyperlipidemia    Hypertension    Left carotid artery stenosis <50%   Left sciatic  nerve pain    Lumbar stenosis    Nocturia    Prostate cancer [facility]) urologist-  dr wrenn/ oncologist-  dr Lorri Rota   dx 01-30-2017 w/ Gleason 3+3, PSA 4.59, active survillance/  biopsy 02-06-2018 , Stage T1c, Gleason 3+4, PSA 7.08-- scheduled for radioactive seed implants   Thyroid  mass    see US  8/20   Wears glasses    Past Surgical History:  Procedure Laterality Date   CYSTOSCOPY  06/20/2018   Procedure: CYSTOSCOPY FLEXIBLE;  Surgeon: Homero Luster, MD;  Location: [facility];  Service: Urology;;  NO SEEDS FOUND IN BLADDER   NO PAST SURGERIES     PROSTATE BIOPSY   01-30-2017;  02-06-2018  dr Inga Manges office   RADIOACTIVE SEED IMPLANT N/A 06/20/2018   Procedure: RADIOACTIVE SEED IMPLANT/BRACHYTHERAPY IMPLANT;  Surgeon: Homero Luster, MD;  Location: [facility];  Service: Urology;  Laterality: N/A;   84   SEEDS IMPLANTED   SPACE OAR INSTILLATION N/A 06/20/2018   Procedure: SPACE OAR INSTILLATION;  Surgeon: Homero Luster, MD;  Location: [facility];  Service: Urology;  Laterality: N/A;   Family History  Problem Relation Age of Onset   Pancreatic cancer Mother    Leukemia Father    Lung cancer Brother    Lung cancer Brother    Lung cancer Brother    Social History   Socioeconomic History   Marital status: Married    Spouse name: Not on file   Number of children: Not on file   Years of education: Not on file   Highest education level: Not on file  Occupational History   Occupation: Corporate investment banker  Tobacco Use   Smoking status: Some Days    Types: Cigars   Smokeless tobacco: Never   Tobacco comments:    06-13-2018  per pt every once in a while , cigar  Vaping Use   Vaping status: Never Used  Substance and Sexual Activity   Alcohol use: Not Currently    Alcohol/week: 7.0 standard drinks of alcohol    Types: 7 Cans of beer per week   Drug use: No   Sexual activity: Not Currently  Other Topics Concern   Not on file  Social History Narrative   Married with one daughter and three sons. Works in Holiday representative.    Social Drivers of Corporate investment banker Strain: Low Risk  (03/13/2024)   Overall Financial Resource Strain (CARDIA)    Difficulty of Paying Living Expenses: Not hard at all  Food Insecurity: No Food Insecurity (03/13/2024)   Hunger Vital Sign    Worried About Running Out of Food in the Last Year: Never true    Ran Out of Food in the Last Year: Never true  Transportation Needs: No Transportation Needs (03/13/2024)   PRAPARE - Administrator, Civil Service (Medical): No    Lack of  Transportation (Non-Medical): No  Physical Activity: Insufficiently Active (03/13/2024)   Exercise Vital Sign    Days of Exercise per Week: 3 days    Minutes of Exercise per Session: 30 min  Stress: No Stress Concern Present (03/13/2024)   [employer] of Occupational Health - Occupational Stress Questionnaire    Feeling of Stress: Not at all  Social Connections: Socially Integrated (03/13/2024)   Social Connection and Isolation Panel    Frequency of Communication with Friends and Family: More than three times a week    Frequency of Social Gatherings with Friends and Family: Three  times a week    Attends Religious Services: More than 4 times per year    Active Member of Clubs or Organizations: Yes    Attends Banker Meetings: More than 4 times per year    Marital Status: Married    Tobacco Counseling Ready to quit: Not Answered Counseling given: Not Answered Tobacco comments: 06-13-2018  per pt every once in a while , cigar   Clinical Intake:  Pre-visit preparation completed: Yes  Pain : No/denies pain     Diabetes: Yes CBG done?: No Did pt. bring in CBG monitor from home?: No  How often do you need to have someone help you when you read instructions, pamphlets, or other written materials from your doctor or pharmacy?: 1 - Never  Interpreter Needed?: No  Information entered by :: Kieth Pelt LPN   Activities of Daily Living    03/13/2024   10:05 AM  In your present state of health, do you have any difficulty performing the following activities:  Hearing? 0  Vision? 0  Difficulty concentrating or making decisions? 0  Walking or climbing stairs? 1  Dressing or bathing? 0  Doing errands, shopping? 1  Preparing Food and eating ? N  Using the Toilet? N  In the past six months, have you accidently leaked urine? Y  Do you have problems with loss of bowel control? N  Managing your Medications? Y  Managing your Finances? N  Housekeeping or managing  your Housekeeping? Y    Patient Care Team: Austine Lefort, MD as PCP - General (Family Medicine) Myrle Aspen, [facility] (Inactive) as Pharmacist (Pharmacist)  Indicate any recent Medical Services you may have received from other than [facility] providers in the past year (date may be approximate).     Assessment:   This is a routine wellness examination for [place].  Hearing/Vision screen Hearing Screening - Comments:: No trouble hearing [facility] - [facility]:: [facility]   Goals Addressed             This Visit's Progress    Patient Stated       Stay healthy       Depression Screen    03/13/2024   10:08 AM 07/17/2023    2:14 PM 02/02/2023    8:50 AM 03/09/2022    8:31 AM 05/15/2019   10:31 AM 03/13/2018    7:38 AM 11/14/2016    8:51 AM  PHQ 2/9 Scores  PHQ - 2 Score 0 0 3 0 0 0 0  PHQ- 9 Score 0  8        Fall Risk    03/13/2024   10:01 AM 07/17/2023    2:14 PM 02/02/2023    8:49 AM 03/09/2022    8:36 AM 02/24/2021    9:29 AM  Fall Risk   Falls in the past year? 0 0 0 0 0  Number falls in past yr: 0 0 0 0 0  Injury with Fall? 0 0 0 0 0  Risk for fall due to :  No Fall Risks No Fall Risks No Fall Risks No Fall Risks  Follow up Falls evaluation completed;Education provided;Falls prevention discussed Falls prevention discussed Education provided;Falls evaluation completed Falls prevention discussed  Falls prevention discussed;Falls evaluation completed      Data saved with a previous flowsheet row definition    MEDICARE RISK AT HOME: Medicare Risk at Home Any stairs in or around the home?: No If so, are there any  without handrails?: No Home free of loose throw rugs in walkways, pet beds, electrical cords, etc?: Yes Adequate lighting in your home to reduce risk of falls?: Yes Life alert?: No Use of a cane, walker or w/c?: No Grab bars in the bathroom?: Yes Shower chair or bench in shower?: No Elevated toilet seat or a handicapped toilet?: Yes  TIMED  UP AND GO:  Was the test performed?  No    Cognitive Function:        03/13/2024   10:03 AM 03/09/2022    8:37 AM  6CIT Screen  What Year? 0 points 0 points  What month? 0 points 0 points  What time? 0 points 0 points  Count back from 20 0 points 0 points  Months in reverse 0 points 0 points  Repeat phrase 6 points 2 points  Total Score 6 points 2 points    Immunizations Immunization History  Administered Date(s) Administered   Fluad Quad(high Dose 65+) 05/15/2019, 07/08/2020, 07/03/2022   Influenza, High Dose Seasonal PF 07/04/2017, 08/01/2018, 06/19/2023   Influenza,inj,Quad PF,6+ Mos 07/08/2020   Influenza-Unspecified 07/03/2022   Moderna Covid-19 Fall Seasonal Vaccine 66yrs & older 06/19/2023, 02/15/2024   PFIZER(Purple Top)SARS-COV-2 Vaccination 10/14/2019, 11/03/2019, 08/02/2020   Pfizer Covid-19 Vaccine Bivalent Booster 74yrs & up 08/09/2021   Pneumococcal Conjugate-13 02/12/2015   Pneumococcal Polysaccharide-23 01/20/2011    TDAP status: Due, Education has been provided regarding the importance of this vaccine. Advised may receive this vaccine at local pharmacy or Health Dept. Aware to provide a copy of the vaccination record if obtained from local pharmacy or Health Dept. Verbalized acceptance and understanding.  Flu Vaccine status: Up to date  Pneumococcal vaccine status: Up to date  Covid-19 vaccine status: Information provided on how to obtain vaccines.   Qualifies for Shingles Vaccine? Yes   Zostavax completed No   Shingrix Completed?: No.    Education has been provided regarding the importance of this vaccine. Patient has been advised to call insurance company to determine out of pocket expense if they have not yet received this vaccine. Advised may also receive vaccine at local pharmacy or Health Dept. Verbalized acceptance and understanding.  Screening Tests Health Maintenance  Topic Date Due   OPHTHALMOLOGY EXAM  Never done   FOOT EXAM  10/13/2016    DTaP/Tdap/Td (1 - Tdap) 07/16/2024 (Originally 12/12/1964)   COVID-19 Vaccine (7 - 2024-25 season) 04/11/2024   INFLUENZA VACCINE  04/25/2024   HEMOGLOBIN A1C  08/26/2024   Diabetic kidney evaluation - Urine ACR  10/11/2024   Diabetic kidney evaluation - eGFR measurement  02/24/2025   Medicare Annual Wellness (AWV)  03/13/2025   Pneumococcal Vaccine: 50+ Years  Completed   HPV VACCINES  Aged Out   Meningococcal B Vaccine  Aged Out   Colonoscopy  Discontinued   Zoster Vaccines- Shingrix  Discontinued    Health Maintenance  Health Maintenance Due  Topic Date Due   OPHTHALMOLOGY EXAM  Never done   FOOT EXAM  10/13/2016    Colorectal cancer screening: No longer required.   Lung Cancer Screening: (Low Dose CT Chest recommended if Age 102-80 years, 20 pack-year currently smoking OR have quit w/in 15years.) does not qualify.   Lung Cancer Screening Referral:   Additional Screening:  Hepatitis C Screening  never done  Vision Screening: Recommended annual ophthalmology exams for early detection of glaucoma and other disorders of the eye. Is the patient up to date with their annual eye exam?  Yes  Who is the  provider or what is the name of the office in which the patient attends annual [facility] exams? [facility] If pt is not established with a provider, would they like to be referred to a provider to establish care? No .   Dental Screening: Recommended annual dental exams for proper oral hygiene  Nutrition Risk Assessment:  Has the patient had any N/V/D within the last 2 months?  No  Does the patient have any non-healing wounds?  No  Has the patient had any unintentional weight loss or weight gain?  No   Diabetes:  Is the patient diabetic?  Yes  If diabetic, was a CBG obtained today?  No  Did the patient bring in their glucometer from home?  No  How often do you monitor your CBG's? Daughter checks 1 x a month.   Financial Strains and Diabetes Management:  Are you having any financial  strains with the device, your supplies or your medication? No .  Does the patient want to be seen by Chronic Care Management for management of their diabetes?  No  Would the patient like to be referred to a Nutritionist or for Diabetic Management?  No   Diabetic Exams:  Diabetic Eye Exam: Completed .Pt has been advised about the importance in completing this exam  Diabetic Foot Exam:  Pt has been advised about the importance in completing this exam. .    Community Resource Referral / Chronic Care Management: CRR required this visit?  No   CCM required this visit?  No     Plan:     I have personally reviewed and noted the following in the patient's chart:   Medical and social history Use of alcohol, tobacco or illicit drugs  Current medications and supplements including opioid prescriptions. Patient is not currently taking opioid prescriptions. Functional ability and status Nutritional status Physical activity Advanced directives List of other physicians Hospitalizations, surgeries, and ER visits in previous 12 months Vitals Screenings to include cognitive, depression, and falls Referrals and appointments  In addition, I have reviewed and discussed with patient certain preventive protocols, quality metrics, and best practice recommendations. A written personalized care plan for preventive services as well as general preventive health recommendations were provided to patient.     Kieth Pelt, LPN   1/61/0960   After Visit Summary: (MyChart) Due to this being a telephonic visit, the after visit summary with patients personalized plan was offered to patient via MyChart   Nurse Notes:

## 2024-03-13 NOTE — Patient Instructions (Signed)
 Mr. Travis Singh , Thank you for taking time to come for your Medicare Wellness Visit. I appreciate your ongoing commitment to your health goals. Please review the following plan we discussed and let me know if I can assist you in the future.   Screening recommendations/referrals: Colonoscopy:  Recommended yearly ophthalmology/optometry visit for glaucoma screening and checkup Recommended yearly dental visit for hygiene and checkup  Vaccinations: Influenza vaccine:  Pneumococcal vaccine:  Tdap vaccine:  Shingles vaccine:       Preventive Care 65 Years and Older, Male Preventive care refers to lifestyle choices and visits with your health care provider that can promote health and wellness. What does preventive care include? A yearly physical exam. This is also called an annual well check. Dental exams once or twice a year. Routine eye exams. Ask your health care provider how often you should have your eyes checked. Personal lifestyle choices, including: Daily care of your teeth and gums. Regular physical activity. Eating a healthy diet. Avoiding tobacco and drug use. Limiting alcohol use. Practicing safe sex. Taking low doses of aspirin every day. Taking vitamin and mineral supplements as recommended by your health care provider. What happens during an annual well check? The services and screenings done by your health care provider during your annual well check will depend on your age, overall health, lifestyle risk factors, and family history of disease. Counseling  Your health care provider may ask you questions about your: Alcohol use. Tobacco use. Drug use. Emotional well-being. Home and relationship well-being. Sexual activity. Eating habits. History of falls. Memory and ability to understand (cognition). Work and work Astronomer. Screening  You may have the following tests or measurements: Height, weight, and BMI. Blood pressure. Lipid and cholesterol levels. These may  be checked every 5 years, or more frequently if you are over 61 years old. Skin check. Lung cancer screening. You may have this screening every year starting at age 47 if you have a 30-pack-year history of smoking and currently smoke or have quit within the past 15 years. Fecal occult blood test (FOBT) of the stool. You may have this test every year starting at age 80. Flexible sigmoidoscopy or colonoscopy. You may have a sigmoidoscopy every 5 years or a colonoscopy every 10 years starting at age 42. Prostate cancer screening. Recommendations will vary depending on your family history and other risks. Hepatitis C blood test. Hepatitis B blood test. Sexually transmitted disease (STD) testing. Diabetes screening. This is done by checking your blood sugar (glucose) after you have not eaten for a while (fasting). You may have this done every 1-3 years. Abdominal aortic aneurysm (AAA) screening. You may need this if you are a current or former smoker. Osteoporosis. You may be screened starting at age 47 if you are at high risk. Talk with your health care provider about your test results, treatment options, and if necessary, the need for more tests. Vaccines  Your health care provider may recommend certain vaccines, such as: Influenza vaccine. This is recommended every year. Tetanus, diphtheria, and acellular pertussis (Tdap, Td) vaccine. You may need a Td booster every 10 years. Zoster vaccine. You may need this after age 10. Pneumococcal 13-valent conjugate (PCV13) vaccine. One dose is recommended after age 14. Pneumococcal polysaccharide (PPSV23) vaccine. One dose is recommended after age 30. Talk to your health care provider about which screenings and vaccines you need and how often you need them. This information is not intended to replace advice given to you by your health care  provider. Make sure you discuss any questions you have with your health care provider. Document Released: 10/08/2015  Document Revised: 05/31/2016 Document Reviewed: 07/13/2015 Elsevier Interactive Patient Education  2017 [employer].  Fall Prevention in the Home Falls can cause injuries. They can happen to people of all ages. There are many things you can do to make your home safe and to help prevent falls. What can I do on the outside of my home? Regularly fix the edges of walkways and driveways and fix any cracks. Remove anything that might make you trip as you walk through a door, such as a raised step or threshold. Trim any bushes or trees on the path to your home. Use bright outdoor lighting. Clear any walking paths of anything that might make someone trip, such as rocks or tools. Regularly check to see if handrails are loose or broken. Make sure that both sides of any steps have handrails. Any raised decks and porches should have guardrails on the edges. Have any leaves, snow, or ice cleared regularly. Use sand or salt on walking paths during winter. Clean up any spills in your garage right away. This includes oil or grease spills. What can I do in the bathroom? Use night lights. Install grab bars by the toilet and in the tub and shower. Do not use towel bars as grab bars. Use non-skid mats or decals in the tub or shower. If you need to sit down in the shower, use a plastic, non-slip stool. Keep the floor dry. Clean up any water that spills on the floor as soon as it happens. Remove soap buildup in the tub or shower regularly. Attach bath mats securely with double-sided non-slip rug tape. Do not have throw rugs and other things on the floor that can make you trip. What can I do in the bedroom? Use night lights. Make sure that you have a light by your bed that is easy to reach. Do not use any sheets or blankets that are too big for your bed. They should not hang down onto the floor. Have a firm chair that has side arms. You can use this for support while you get dressed. Do not have throw rugs  and other things on the floor that can make you trip. What can I do in the kitchen? Clean up any spills right away. Avoid walking on wet floors. Keep items that you use a lot in easy-to-reach places. If you need to reach something above you, use a strong step stool that has a grab bar. Keep electrical cords out of the way. Do not use floor polish or wax that makes floors slippery. If you must use wax, use non-skid floor wax. Do not have throw rugs and other things on the floor that can make you trip. What can I do with my stairs? Do not leave any items on the stairs. Make sure that there are handrails on both sides of the stairs and use them. Fix handrails that are broken or loose. Make sure that handrails are as long as the stairways. Check any carpeting to make sure that it is firmly attached to the stairs. Fix any carpet that is loose or worn. Avoid having throw rugs at the top or bottom of the stairs. If you do have throw rugs, attach them to the floor with carpet tape. Make sure that you have a light switch at the top of the stairs and the bottom of the stairs. If you do not have  them, ask someone to add them for you. What else can I do to help prevent falls? Wear shoes that: Do not have high heels. Have rubber bottoms. Are comfortable and fit you well. Are closed at the toe. Do not wear sandals. If you use a stepladder: Make sure that it is fully opened. Do not climb a closed stepladder. Make sure that both sides of the stepladder are locked into place. Ask someone to hold it for you, if possible. Clearly mark and make sure that you can see: Any grab bars or handrails. First and last steps. Where the edge of each step is. Use tools that help you move around (mobility aids) if they are needed. These include: Canes. Walkers. Scooters. Crutches. Turn on the lights when you go into a dark area. Replace any light bulbs as soon as they burn out. Set up your furniture so you have a  clear path. Avoid moving your furniture around. If any of your floors are uneven, fix them. If there are any pets around you, be aware of where they are. Review your medicines with your doctor. Some medicines can make you feel dizzy. This can increase your chance of falling. Ask your doctor what other things that you can do to help prevent falls. This information is not intended to replace advice given to you by your health care provider. Make sure you discuss any questions you have with your health care provider. Document Released: 07/08/2009 Document Revised: 02/17/2016 Document Reviewed: 10/16/2014 Elsevier Interactive Patient Education  2017 [employer].

## 2024-05-27 ENCOUNTER — Encounter: Payer: Self-pay | Admitting: Family Medicine

## 2024-05-27 ENCOUNTER — Ambulatory Visit (INDEPENDENT_AMBULATORY_CARE_PROVIDER_SITE_OTHER): Admitting: Family Medicine

## 2024-05-27 VITALS — BP 130/72 | HR 57 | Temp 97.7°F | Ht 68.0 in | Wt 196.2 lb

## 2024-05-27 DIAGNOSIS — E118 Type 2 diabetes mellitus with unspecified complications: Secondary | ICD-10-CM

## 2024-05-27 DIAGNOSIS — E119 Type 2 diabetes mellitus without complications: Secondary | ICD-10-CM | POA: Insufficient documentation

## 2024-05-27 DIAGNOSIS — I10 Essential (primary) hypertension: Secondary | ICD-10-CM | POA: Diagnosis not present

## 2024-05-27 DIAGNOSIS — Z7984 Long term (current) use of oral hypoglycemic drugs: Secondary | ICD-10-CM

## 2024-05-27 DIAGNOSIS — N183 Chronic kidney disease, stage 3 unspecified: Secondary | ICD-10-CM | POA: Diagnosis not present

## 2024-05-27 NOTE — Progress Notes (Signed)
 Subjective:    Patient ID: Travis Singh, male    DOB: Sep 24, 1946, 78 y.o.   MRN: [id number] Here for follow up of his DM2 and stage 3b CKD.  Denies cp,sob, doe.  Denies neuropathy in feet.  Not experiencing weight loss.  Diabetic eye exam ordered.  Gets his eyes checked at the [place].  Not checking sugars but 1-2 per month.  Past Medical History:  Diagnosis Date   Bulging lumbar disc    multiple levels   Diabetes mellitus type 2 with complications [facility])    ED (erectile dysfunction)    Gout    History of adenomatous polyp of colon    Hyperlipidemia    Hypertension    Left carotid artery stenosis <50%   Left sciatic nerve pain    Lumbar stenosis    Nocturia    Prostate cancer [facility]) urologist-  dr wrenn/ oncologist-  dr patrcia   dx 01-30-2017 w/ Gleason 3+3, PSA 4.59, active survillance/  biopsy 02-06-2018 , Stage T1c, Gleason 3+4, PSA 7.08-- scheduled for radioactive seed implants   Thyroid  mass    see US  8/20   Wears glasses    Past Surgical History:  Procedure Laterality Date   CYSTOSCOPY  06/20/2018   Procedure: CYSTOSCOPY FLEXIBLE;  Surgeon: Watt Rush, MD;  Location: [facility];  Service: Urology;;  NO SEEDS FOUND IN BLADDER   NO PAST SURGERIES     PROSTATE BIOPSY  01-30-2017;  02-06-2018  dr watt office   RADIOACTIVE SEED IMPLANT N/A 06/20/2018   Procedure: RADIOACTIVE SEED IMPLANT/BRACHYTHERAPY IMPLANT;  Surgeon: Watt Rush, MD;  Location: [facility];  Service: Urology;  Laterality: N/A;   86   SEEDS IMPLANTED   SPACE OAR INSTILLATION N/A 06/20/2018   Procedure: SPACE OAR INSTILLATION;  Surgeon: Watt Rush, MD;  Location: [facility];  Service: Urology;  Laterality: N/A;   Current Outpatient Medications on File Prior to Visit  Medication Sig Dispense Refill   amLODipine  (NORVASC ) 10 MG tablet Take 1 tablet (10 mg total) by mouth daily. 90 tablet 3   apixaban  (ELIQUIS ) 5 MG TABS tablet Take 5 mg by mouth 2 (two) times daily.      atorvastatin  (LIPITOR) 40 MG tablet Take 1 tablet by mouth once daily 90 tablet 0   colchicine  0.6 MG tablet Take 1 tablet (0.6 mg total) by mouth daily. 90 tablet 1   dapagliflozin  propanediol (FARXIGA ) 10 MG TABS tablet Take 1 tablet (10 mg total) by mouth daily before breakfast. 30 tablet 1   empagliflozin (JARDIANCE) 25 MG TABS tablet Take 25 mg by mouth daily.     Finerenone (KERENDIA) 10 MG TABS Take by mouth.     lisinopril  (ZESTRIL ) 10 MG tablet Take 10 mg by mouth daily.     metoprolol  succinate (TOPROL -XL) 25 MG 24 hr tablet Take 1 tablet (25 mg total) by mouth daily. 90 tablet 3   warfarin (COUMADIN ) 5 MG tablet Take 1 tablet (5 mg total) by mouth daily. As directed 30 tablet 3   allopurinol  (ZYLOPRIM ) 300 MG tablet Take 1 tablet by mouth once daily (Patient not taking: Reported on 05/27/2024) 90 tablet 3   lisinopril  (ZESTRIL ) 40 MG tablet Take 1 tablet by mouth once daily (Patient not taking: Reported on 05/27/2024) 90 tablet 0   No current facility-administered medications on file prior to visit.   No Known Allergies Social History   Socioeconomic History   Marital status: Married  Spouse name: Not on file   Number of children: Not on file   Years of education: Not on file   Highest education level: Not on file  Occupational History   Occupation: Corporate investment banker  Tobacco Use   Smoking status: Some Days    Types: Cigars   Smokeless tobacco: Never   Tobacco comments:    06-13-2018  per pt every once in a while , cigar  Vaping Use   Vaping status: Never Used  Substance and Sexual Activity   Alcohol use: Not Currently    Alcohol/week: 7.0 standard drinks of alcohol    Types: 7 Cans of beer per week   Drug use: No   Sexual activity: Not Currently  Other Topics Concern   Not on file  Social History Narrative   Married with one daughter and three sons. Works in Holiday representative.    Social Drivers of Corporate investment banker Strain: Low Risk  (03/13/2024)    Overall Financial Resource Strain (CARDIA)    Difficulty of Paying Living Expenses: Not hard at all  Food Insecurity: No Food Insecurity (03/13/2024)   Hunger Vital Sign    Worried About Running Out of Food in the Last Year: Never true    Ran Out of Food in the Last Year: Never true  Transportation Needs: No Transportation Needs (03/13/2024)   PRAPARE - Administrator, Civil Service (Medical): No    Lack of Transportation (Non-Medical): No  Physical Activity: Insufficiently Active (03/13/2024)   Exercise Vital Sign    Days of Exercise per Week: 3 days    Minutes of Exercise per Session: 30 min  Stress: No Stress Concern Present (03/13/2024)   [employer] of Occupational Health - Occupational Stress Questionnaire    Feeling of Stress: Not at all  Social Connections: Socially Integrated (03/13/2024)   Social Connection and Isolation Panel    Frequency of Communication with Friends and Family: More than three times a week    Frequency of Social Gatherings with Friends and Family: Three times a week    Attends Religious Services: More than 4 times per year    Active Member of Clubs or Organizations: Yes    Attends Banker Meetings: More than 4 times per year    Marital Status: Married  Catering manager Violence: Not At Risk (03/13/2024)   Humiliation, Afraid, Rape, and Kick questionnaire    Fear of Current or Ex-Partner: No    Emotionally Abused: No    Physically Abused: No    Sexually Abused: No   Family History  Problem Relation Age of Onset   Pancreatic cancer Mother    Leukemia Father    Lung cancer Brother    Lung cancer Brother    Lung cancer Brother   '    Review of Systems  All other systems reviewed and are negative.      Objective:   Physical Exam Constitutional:      General: He is not in acute distress.    Appearance: Normal appearance. He is obese. He is not ill-appearing, toxic-appearing or diaphoretic.  HENT:     Head:  Normocephalic and atraumatic.  Neck:     Thyroid : Thyromegaly present.  Cardiovascular:     Rate and Rhythm: Normal rate and regular rhythm.     Pulses: Normal pulses.     Heart sounds: Normal heart sounds. No murmur heard.    No friction rub. No gallop.  Pulmonary:  Effort: Pulmonary effort is normal. No respiratory distress.     Breath sounds: Normal breath sounds. No stridor. No wheezing, rhonchi or rales.  Chest:     Chest wall: No tenderness.  Musculoskeletal:        General: Normal range of motion.     Cervical back: Neck supple. No muscular tenderness.     Right lower leg: No edema.     Left lower leg: No edema.  Skin:    General: Skin is warm.     Coloration: Skin is not jaundiced or pale.     Findings: No bruising, erythema, lesion or rash.  Neurological:     General: No focal deficit present.     Mental Status: He is alert and oriented to person, place, and time.     Cranial Nerves: No cranial nerve deficit.     Sensory: No sensory deficit.     Motor: No weakness.     Coordination: Coordination normal.     Gait: Gait normal.     Deep Tendon Reflexes: Reflexes normal.  Psychiatric:        Mood and Affect: Mood normal.        Behavior: Behavior normal.        Thought Content: Thought content normal.        Judgment: Judgment normal.           Assessment & Plan:  Stage 3 chronic kidney disease, unspecified whether stage 3a or 3b CKD (HCC) - Plan: CBC with Differential/Platelet, Comprehensive metabolic panel with GFR, Lipid panel, Hemoglobin A1c, Microalbumin/Creatinine Ratio, Urine  Diabetes mellitus type 2 with complications (HCC) - Plan: CBC with Differential/Platelet, Comprehensive metabolic panel with GFR, Lipid panel, Hemoglobin A1c, Microalbumin/Creatinine Ratio, Urine, Ambulatory referral to Ophthalmology  Hypertension, unspecified type Diabetic eye exam ordered.  Diabetic foot exam performed.  BP excellent Check cbc, cmp, A1c and flp along with  microalbumin/cr ratio.  Goal A1c is 6.5.  Already on kerendia and lisinopril .  Quit farxiga  but [facility] j[facility]  [facility]

## 2024-05-28 LAB — COMPREHENSIVE METABOLIC PANEL WITH GFR
AG Ratio: 1.7 (calc) (ref 1.0–2.5)
ALT: 27 U/L (ref 9–46)
AST: 24 U/L (ref 10–35)
Albumin: 4.4 g/dL (ref 3.6–5.1)
Alkaline phosphatase (APISO): 63 U/L (ref 35–144)
BUN/Creatinine Ratio: 15 (calc) (ref 6–22)
BUN: 29 mg/dL — ABNORMAL HIGH (ref 7–25)
CO2: 20 mmol/L (ref 20–32)
Calcium: 9.1 mg/dL (ref 8.6–10.3)
Chloride: 113 mmol/L — ABNORMAL HIGH (ref 98–110)
Creat: 1.96 mg/dL — ABNORMAL HIGH (ref 0.70–1.22)
Globulin: 2.6 g/dL (ref 1.9–3.7)
Glucose, Bld: 147 mg/dL — ABNORMAL HIGH (ref 65–99)
Potassium: 4.5 mmol/L (ref 3.5–5.3)
Sodium: 143 mmol/L (ref 135–146)
Total Bilirubin: 0.7 mg/dL (ref 0.2–1.2)
Total Protein: 7 g/dL (ref 6.1–8.1)
eGFR: 34 mL/min/1.73m2 — ABNORMAL LOW (ref >=60)

## 2024-05-28 LAB — CBC WITH DIFFERENTIAL/PLATELET
Absolute Lymphocytes: 1062 {cells}/uL (ref 850–3900)
Absolute Monocytes: 513 {cells}/uL (ref 200–950)
Basophils Absolute: 18 {cells}/uL (ref 0–200)
Basophils Relative: 0.4 %
Eosinophils Absolute: 72 {cells}/uL (ref 15–500)
Eosinophils Relative: 1.6 %
HCT: 42.6 % (ref 38.5–50.0)
Hemoglobin: 13.7 g/dL (ref 13.2–17.1)
MCH: 28 pg (ref 27.0–33.0)
MCHC: 32.2 g/dL (ref 32.0–36.0)
MCV: 86.9 fL (ref 80.0–100.0)
MPV: 11 fL (ref 7.5–12.5)
Monocytes Relative: 11.4 %
Neutro Abs: 2835 {cells}/uL (ref 1500–7800)
Neutrophils Relative %: 63 %
Platelets: 169 Thousand/uL (ref 140–400)
RBC: 4.9 Million/uL (ref 4.20–5.80)
RDW: 13.8 % (ref 11.0–15.0)
Total Lymphocyte: 23.6 %
WBC: 4.5 Thousand/uL (ref 3.8–10.8)

## 2024-05-28 LAB — MICROALBUMIN / CREATININE URINE RATIO
Creatinine, Urine: 100 mg/dL (ref 20–320)
Microalb Creat Ratio: 1083 mg/g{creat} — ABNORMAL HIGH (ref <30)
Microalb, Ur: 108.3 mg/dL

## 2024-05-28 LAB — LIPID PANEL
Cholesterol: 142 mg/dL (ref <200)
HDL: 37 mg/dL — ABNORMAL LOW (ref >=40)
LDL Cholesterol (Calc): 78 mg/dL
Non-HDL Cholesterol (Calc): 105 mg/dL (ref <130)
Total CHOL/HDL Ratio: 3.8 (calc) (ref <5.0)
Triglycerides: 170 mg/dL — ABNORMAL HIGH (ref <150)

## 2024-05-28 LAB — HEMOGLOBIN A1C
Hgb A1c MFr Bld: 6.6 % — ABNORMAL HIGH (ref <5.7)
Mean Plasma Glucose: 143 mg/dL
eAG (mmol/L): 7.9 mmol/L

## 2024-05-29 ENCOUNTER — Other Ambulatory Visit: Payer: Self-pay

## 2024-05-29 ENCOUNTER — Ambulatory Visit: Payer: Self-pay | Admitting: Family Medicine

## 2024-05-29 MED ORDER — LISINOPRIL 20 MG PO TABS: 20.0000 mg | ORAL_TABLET | Freq: Every day | ORAL | 3 refills | Status: AC

## 2024-06-05 DIAGNOSIS — I129 Hypertensive chronic kidney disease with stage 1 through stage 4 chronic kidney disease, or unspecified chronic kidney disease: Secondary | ICD-10-CM | POA: Diagnosis not present

## 2024-06-05 DIAGNOSIS — E785 Hyperlipidemia, unspecified: Secondary | ICD-10-CM | POA: Diagnosis not present

## 2024-06-05 DIAGNOSIS — E1122 Type 2 diabetes mellitus with diabetic chronic kidney disease: Secondary | ICD-10-CM | POA: Diagnosis not present

## 2024-06-05 DIAGNOSIS — N2581 Secondary hyperparathyroidism of renal origin: Secondary | ICD-10-CM | POA: Diagnosis not present

## 2024-06-05 DIAGNOSIS — D631 Anemia in chronic kidney disease: Secondary | ICD-10-CM | POA: Diagnosis not present

## 2024-06-05 DIAGNOSIS — N1832 Chronic kidney disease, stage 3b: Secondary | ICD-10-CM | POA: Diagnosis not present

## 2024-06-12 DIAGNOSIS — N1832 Chronic kidney disease, stage 3b: Secondary | ICD-10-CM | POA: Diagnosis not present

## 2024-06-13 DIAGNOSIS — N1832 Chronic kidney disease, stage 3b: Secondary | ICD-10-CM | POA: Diagnosis not present

## 2025-03-19 ENCOUNTER — Encounter
